# Patient Record
Sex: Female | Born: 1990 | Race: White | Hispanic: No | Marital: Single | Smoking: Current every day smoker
Health system: Southern US, Community
[De-identification: ages and names within clinical notes are randomized; demographics above are authoritative.]

## PROBLEM LIST (undated history)

## (undated) ENCOUNTER — Inpatient Hospital Stay (HOSPITAL_COMMUNITY): Payer: Self-pay

## (undated) DIAGNOSIS — B009 Herpesviral infection, unspecified: Secondary | ICD-10-CM

## (undated) DIAGNOSIS — B999 Unspecified infectious disease: Secondary | ICD-10-CM

## (undated) DIAGNOSIS — Z3490 Encounter for supervision of normal pregnancy, unspecified, unspecified trimester: Secondary | ICD-10-CM

## (undated) DIAGNOSIS — R569 Unspecified convulsions: Secondary | ICD-10-CM

## (undated) DIAGNOSIS — R87629 Unspecified abnormal cytological findings in specimens from vagina: Secondary | ICD-10-CM

## (undated) DIAGNOSIS — F32A Depression, unspecified: Secondary | ICD-10-CM

## (undated) DIAGNOSIS — F329 Major depressive disorder, single episode, unspecified: Secondary | ICD-10-CM

## (undated) HISTORY — PX: NO PAST SURGERIES: SHX2092

## (undated) HISTORY — DX: Herpesviral infection, unspecified: B00.9

## (undated) HISTORY — DX: Unspecified abnormal cytological findings in specimens from vagina: R87.629

---
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
## 2009-07-13 IMAGING — CR DG CHEST 2V
2 series · 2 of 2 positions shown · non-contrast
Comparison: None

CLINICAL DATA: Cough, fever, and sore throat

CHEST - 2 VIEW

[w chest pa]
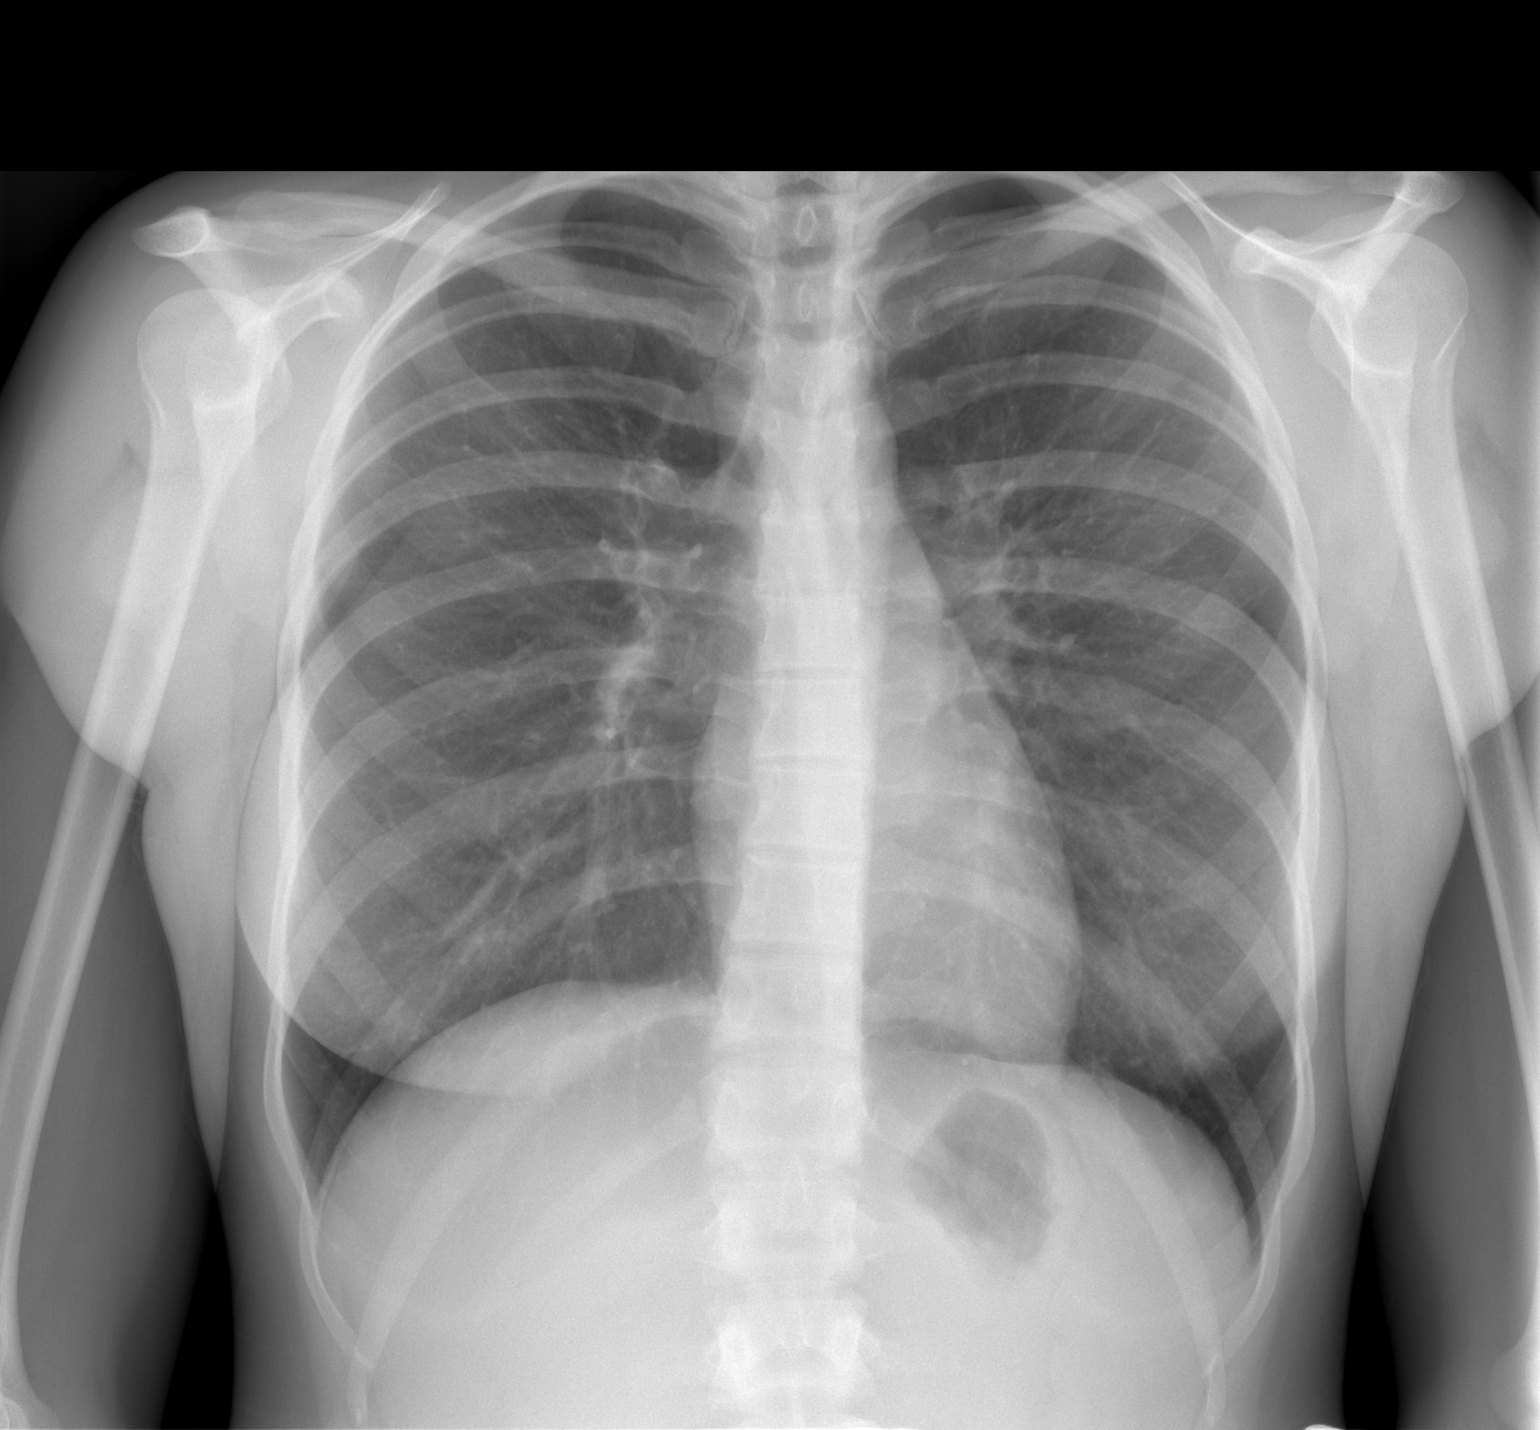

[w chest lat]
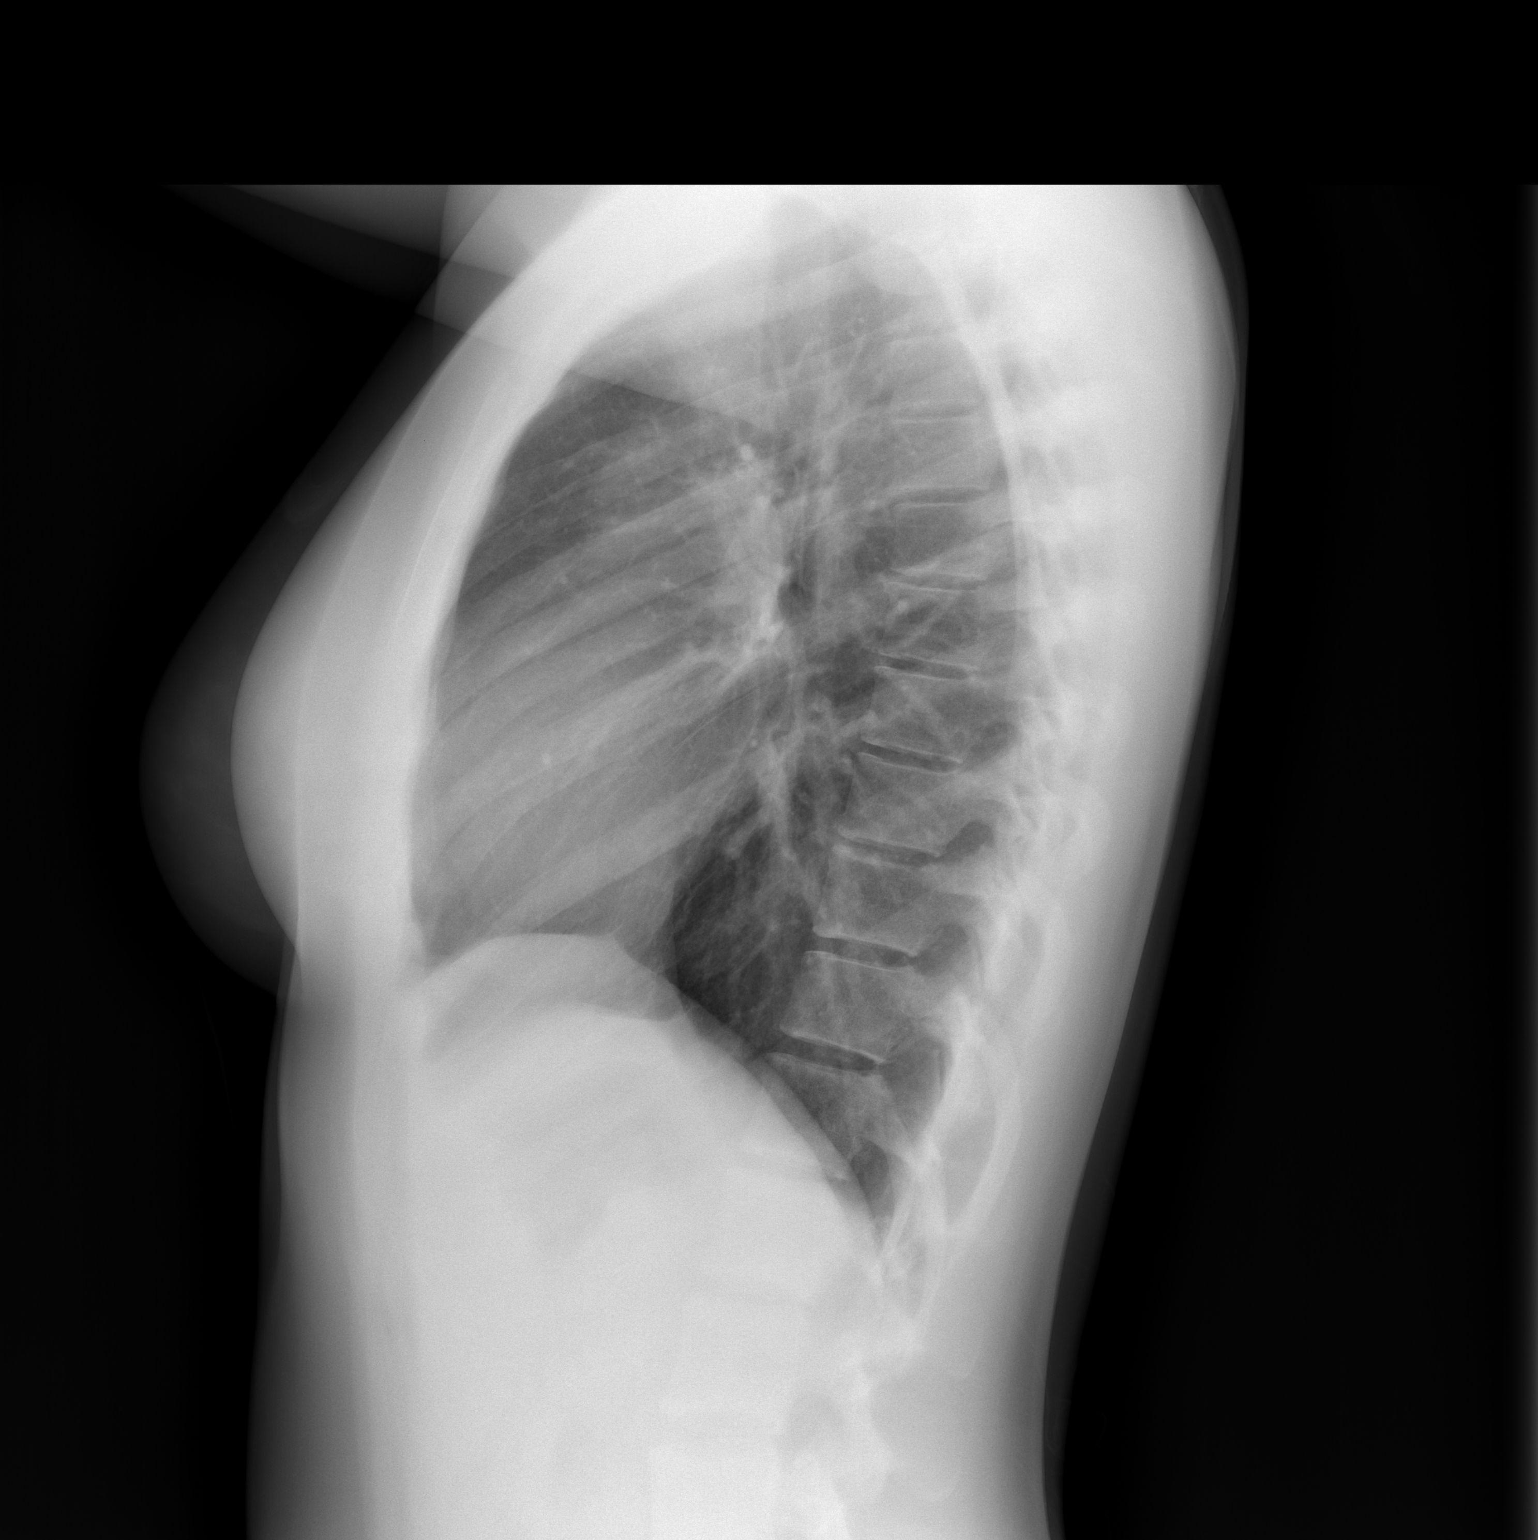

[2 of 2 positions shown; findings below may reference images not displayed]

FINDINGS: The heart size and vascularity are normal and the lungs are clear
except for some minimal peribronchial thickening consistent with
bronchitis.  No bony abnormality.
IMPRESSION: Mild bronchitic changes.

## 2010-11-05 ENCOUNTER — Emergency Department (HOSPITAL_COMMUNITY)
Admission: EM | Admit: 2010-11-05 | Discharge: 2010-11-05 | Disposition: A | Discharge status: Home / Self Care | Payer: Self-pay | Attending: Emergency Medicine | Admitting: Emergency Medicine

## 2010-11-05 DIAGNOSIS — A499 Bacterial infection, unspecified: Secondary | ICD-10-CM | POA: Insufficient documentation

## 2010-11-05 DIAGNOSIS — B9689 Other specified bacterial agents as the cause of diseases classified elsewhere: Secondary | ICD-10-CM | POA: Insufficient documentation

## 2010-11-05 DIAGNOSIS — N760 Acute vaginitis: Secondary | ICD-10-CM | POA: Insufficient documentation

## 2010-11-05 LAB — POCT PREGNANCY, URINE: Preg Test, Ur: NEGATIVE

## 2010-11-05 LAB — WET PREP, GENITAL
Trich, Wet Prep: NONE SEEN
Yeast Wet Prep HPF POC: NONE SEEN

## 2010-11-06 LAB — GC/CHLAMYDIA PROBE AMP, GENITAL
Chlamydia, DNA Probe: NEGATIVE
GC Probe Amp, Genital: NEGATIVE

## 2011-10-29 ENCOUNTER — Emergency Department (HOSPITAL_COMMUNITY)
Admission: EM | Admit: 2011-10-29 | Discharge: 2011-10-29 | Discharge status: Against Medical Advice | Payer: Self-pay | Attending: Emergency Medicine | Admitting: Emergency Medicine

## 2011-10-29 ENCOUNTER — Encounter (HOSPITAL_COMMUNITY): Payer: Self-pay | Admitting: *Deleted

## 2011-10-29 DIAGNOSIS — N898 Other specified noninflammatory disorders of vagina: Secondary | ICD-10-CM | POA: Insufficient documentation

## 2011-10-29 NOTE — ED Notes (Addendum)
Patient with history of std and thinks she has it again with vaginal discharge/odor

## 2011-10-29 NOTE — ED Notes (Signed)
Pt unable to wait any longer, will come back another day.

## 2012-01-28 ENCOUNTER — Encounter (HOSPITAL_COMMUNITY): Payer: Self-pay | Admitting: *Deleted

## 2012-01-28 ENCOUNTER — Emergency Department (HOSPITAL_COMMUNITY)
Admission: EM | Admit: 2012-01-28 | Discharge: 2012-01-30 | Disposition: A | Discharge status: Other Acute Inpatient Hospital | Payer: Self-pay | Attending: Emergency Medicine | Admitting: Emergency Medicine

## 2012-01-28 DIAGNOSIS — R45851 Suicidal ideations: Secondary | ICD-10-CM | POA: Insufficient documentation

## 2012-01-28 HISTORY — DX: Depression, unspecified: F32.A

## 2012-01-28 HISTORY — DX: Major depressive disorder, single episode, unspecified: F32.9

## 2012-01-28 LAB — COMPREHENSIVE METABOLIC PANEL
ALT: 23 U/L (ref 0–35)
Alkaline Phosphatase: 40 U/L (ref 39–117)
Chloride: 103 mEq/L (ref 96–112)
GFR calc Af Amer: >90 mL/min (ref >90)
Glucose, Bld: 102 mg/dL — ABNORMAL HIGH (ref 70–99)
Potassium: 3.5 mEq/L (ref 3.5–5.1)
Sodium: 135 mEq/L (ref 135–145)
Total Bilirubin: 0.3 mg/dL (ref 0.3–1.2)
Total Protein: 7.3 g/dL (ref 6.0–8.3)

## 2012-01-28 LAB — URINALYSIS, ROUTINE W REFLEX MICROSCOPIC
Ketones, ur: NEGATIVE mg/dL
Leukocytes, UA: NEGATIVE
Nitrite: NEGATIVE
Protein, ur: 30 mg/dL — AB
pH: 7 (ref 5.0–8.0)

## 2012-01-28 LAB — RAPID URINE DRUG SCREEN, HOSP PERFORMED
Amphetamines: NOT DETECTED
Barbiturates: NOT DETECTED
Benzodiazepines: NOT DETECTED

## 2012-01-28 LAB — CBC WITH DIFFERENTIAL/PLATELET
Eosinophils Absolute: 0.2 10*3/uL (ref 0.0–0.7)
Hemoglobin: 14.3 g/dL (ref 12.0–15.0)
Lymphocytes Relative: 20 % (ref 12–46)
Lymphs Abs: 1.3 10*3/uL (ref 0.7–4.0)
Neutro Abs: 4.3 10*3/uL (ref 1.7–7.7)
Neutrophils Relative %: 67 % (ref 43–77)
Platelets: 238 10*3/uL (ref 150–400)
RBC: 4.72 MIL/uL (ref 3.87–5.11)
WBC: 6.4 10*3/uL (ref 4.0–10.5)

## 2012-01-28 MED ORDER — ACYCLOVIR 800 MG PO TABS
ORAL_TABLET | ORAL | Status: AC
  Administered 2012-01-28: 400 mg
  Filled 2012-01-28: qty 1
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
## 2012-01-28 NOTE — ED Notes (Signed)
Patient wanted to know if she could make a phone call to her ex boyfriend and sheriff told patient no that that is one of the reasons she is up here in the first place. Patient is crying in her room and wanting to know how long till she is seen by a doctor. Patient was told the MD would be in as soon as possible.

## 2012-01-29 MED ORDER — NYSTATIN 100000 UNIT/GM EX CREA
TOPICAL_CREAM | CUTANEOUS | Status: AC
  Filled 2012-01-29: qty 15

## 2012-01-29 MED ORDER — ACYCLOVIR 800 MG PO TABS
400.0000 mg | ORAL_TABLET | Freq: Two times a day (BID) | ORAL | Status: DC
  Administered 2012-01-29 – 2012-01-30 (×3): 400 mg via ORAL
  Filled 2012-01-29: qty 1
  Filled 2012-01-29: qty 2
  Filled 2012-01-29: qty 1

## 2012-01-29 MED ORDER — NYSTATIN 100000 UNIT/GM EX CREA
TOPICAL_CREAM | Freq: Two times a day (BID) | CUTANEOUS | Status: DC
  Administered 2012-01-29: 22:00:00 via TOPICAL
  Filled 2012-01-29 (×2): qty 15

## 2012-01-29 MED ORDER — LORAZEPAM 1 MG PO TABS
ORAL_TABLET | ORAL | Status: AC
  Administered 2012-01-29: 1 mg
  Filled 2012-01-29: qty 1

## 2012-01-29 NOTE — [facility] Counselor (Signed)
[facility] has been accepted at [facility] by Alan Watt PA pending bed availability. 

## 2012-01-29 NOTE — ED Notes (Signed)
RN aware

## 2012-01-29 NOTE — ED Notes (Signed)
[facility]

## 2012-01-29 NOTE — ED Notes (Signed)
Patient is upset and wanting to call her mother but does not know her mother's number. Patient was told that I would try to look up her mother's number and let her call.

## 2012-01-29 NOTE — BH Assessment (Signed)
[facility] Assessment Progress Note      01/29/2012  There are no beds available at [facility] at this time. Information faxed and patient placed on waiting list.  There are no beds at [facility].  Spoke with Gabriel Cirri at [facility]. Currently there are no beds, however, there may be discharges tomorrow so information was faxed and patient is on waiting list for consideration of admission.  Patient has no insurance, so these are the referrals which can be made for the client.  Shon Baton, MSW, LCSW, LCASA, CSW-G
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
## 2012-01-30 MED ORDER — NYSTATIN 100000 UNIT/GM EX CREA
TOPICAL_CREAM | CUTANEOUS | Status: AC
  Filled 2012-01-30: qty 15

## 2012-01-30 NOTE — [facility] Counselor (Signed)
Ms Sandner has been accepted by Dr Wendall Stade to [facility] as an involuntary admission. New IVC paperwork has been completed. Support paperwork also completed. The patient is a [employer] resident and is a [employer]. Dr Deretha Emory informed of patient status.
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
## 2012-01-30 NOTE — ED Notes (Signed)
Report called to Tresa Endo, RN at [facility].

## 2012-01-30 NOTE — ED Notes (Signed)
Pt sleeping at this time, laying on back, resp even and nonlabored, NAD noted at this time. Sitter with patient at this time for safety.

## 2012-04-25 DIAGNOSIS — R569 Unspecified convulsions: Secondary | ICD-10-CM

## 2012-04-25 HISTORY — DX: Unspecified convulsions: R56.9

## 2013-12-04 DIAGNOSIS — F418 Other specified anxiety disorders: Secondary | ICD-10-CM | POA: Insufficient documentation

## 2015-05-21 LAB — HM PAP SMEAR: HM Pap smear: NORMAL

## 2016-05-30 DIAGNOSIS — Z113 Encounter for screening for infections with a predominantly sexual mode of transmission: Secondary | ICD-10-CM | POA: Diagnosis not present

## 2016-05-30 DIAGNOSIS — Z3009 Encounter for other general counseling and advice on contraception: Secondary | ICD-10-CM | POA: Diagnosis not present

## 2016-05-30 DIAGNOSIS — Z01419 Encounter for gynecological examination (general) (routine) without abnormal findings: Secondary | ICD-10-CM | POA: Diagnosis not present

## 2016-05-30 DIAGNOSIS — Z3041 Encounter for surveillance of contraceptive pills: Secondary | ICD-10-CM | POA: Diagnosis not present

## 2016-05-30 DIAGNOSIS — Z720 Tobacco use: Secondary | ICD-10-CM | POA: Diagnosis not present

## 2017-01-31 ENCOUNTER — Ambulatory Visit (INDEPENDENT_AMBULATORY_CARE_PROVIDER_SITE_OTHER): Payer: BLUE CROSS/BLUE SHIELD | Admitting: Physician Assistant

## 2017-01-31 ENCOUNTER — Other Ambulatory Visit (HOSPITAL_COMMUNITY)
Admission: RE | Admit: 2017-01-31 | Discharge: 2017-01-31 | Disposition: A | Discharge status: Home / Self Care | Payer: BLUE CROSS/BLUE SHIELD | Source: Ambulatory Visit | Attending: Physician Assistant | Admitting: Physician Assistant

## 2017-01-31 ENCOUNTER — Encounter: Payer: Self-pay | Admitting: Physician Assistant

## 2017-01-31 VITALS — BP 112/78 | HR 97 | Temp 98.7°F | Resp 14 | Ht 66.0 in | Wt 183.0 lb

## 2017-01-31 DIAGNOSIS — A601 Herpesviral infection of perianal skin and rectum: Secondary | ICD-10-CM

## 2017-01-31 DIAGNOSIS — Z113 Encounter for screening for infections with a predominantly sexual mode of transmission: Secondary | ICD-10-CM | POA: Diagnosis not present

## 2017-01-31 DIAGNOSIS — N898 Other specified noninflammatory disorders of vagina: Secondary | ICD-10-CM | POA: Diagnosis not present

## 2017-01-31 MED ORDER — METRONIDAZOLE 0.75 % EX GEL: CUTANEOUS | 0 refills | Status: DC

## 2017-01-31 MED ORDER — ACYCLOVIR 400 MG PO TABS: ORAL_TABLET | ORAL | 0 refills | Status: DC

## 2017-01-31 NOTE — Progress Notes (Signed)
Pre visit review using our clinic review tool, if applicable. No additional management support is needed unless otherwise documented below in the visit note. 

## 2017-01-31 NOTE — Patient Instructions (Signed)
Please go to the lab for blood work. I will call you with results of STD testing (blood and urine). Keep hydrated. Try to eat a well-balanced diet.   Take the Acyclovir as directed for current outbreak and then once daily for prevention. Start the metrogel and take as directed for BV.

## 2017-01-31 NOTE — Progress Notes (Signed)
   Patient presents to clinic today to establish care. 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 Endorses an outbreak about once every 2-3 months. Was previously on Acyclovir but has been out for some time. Would like to discuss treatment for current outbreak as well as preventative therapy. Patient also requests a routine STI panel. Has been sexually active with one female partner for 5 years but states they recently "took a break" and both had sexual encounters with new partners before getting back together. Endorses some increased vaginal discharge with a fishy odor. Has history of BV. Is currently on her menstrual period.   Past Medical History:  Diagnosis Date  . Depression   . HSV-2 infection     No current outpatient prescriptions on file prior to visit.   No current facility-administered medications on file prior to visit.     No Known Allergies  Family History  Problem Relation Age of Onset  . Other Mother        Bone deteriation  . Alcohol abuse Mother   . Cirrhosis Father   . Alcohol abuse Father   . Diabetes Brother   . Alcohol abuse Maternal Grandmother   . Alcohol abuse Maternal Grandfather   . Alcohol abuse Paternal Grandmother   . Alcohol abuse Paternal Grandfather     Social History   Social History  . Marital status: Single    Spouse name: N/A  . Number of children: N/A  . Years of education: N/A   Social History Main Topics  . Smoking status: Current Every Day Smoker    Packs/day: 0.50    Types: Cigarettes  . Smokeless tobacco: Never Used  . Alcohol use Yes     Comment: 5 shots of liquor per day  . Drug use: Yes    Types: Marijuana  . Sexual activity: Yes    Birth control/ protection: Condom   Other Topics Concern  . None   Social History Narrative  . None   Review of Systems - See HPI.  All other ROS are negative.  BP 112/78   Pulse 97   Temp 98.7 F (37.1 C) (Oral)   Resp 14   Ht   (1.676 m)   Wt 183 lb (83 kg)   SpO2 98%   BMI 29.54 kg/m   Physical Exam  Constitutional: She is well-developed, well-nourished, and in no distress.  HENT:  Head: Normocephalic and atraumatic.  Eyes: Conjunctivae are normal.  Neck: Neck supple.  Cardiovascular: Normal rate, regular rhythm, normal heart sounds and intact distal pulses.   Pulmonary/Chest: Effort normal and breath sounds normal. No respiratory distress. She has no wheezes. She has no rales. She exhibits no tenderness.  Genitourinary:  Genitourinary Comments: Patient defers  Lymphadenopathy:    She has no cervical adenopathy.  Skin: Skin is warm and dry. No rash noted.  Vitals reviewed.  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 Start Acyclovir 400 mg TID x 5 days. Then start once daily for prevention. Discussed healthy diet and exercise for immune support. Discussed smoking and effects on outbreaks.  Vaginal discharge Patient on menstrual cycle. Declines pelvic exam. Urine ancillary testing performed in addition to STI panel. Will start metrogel for BV giving history.  Screening examination for STD (sexually transmitted disease) Routine STI screen labs today. Patient with high risk sexual behavior. Discussed safe sex practices.     Piedad Climes, PA-C

## 2017-02-01 DIAGNOSIS — Z0000 Encounter for general adult medical examination without abnormal findings: Secondary | ICD-10-CM | POA: Insufficient documentation

## 2017-02-01 DIAGNOSIS — N898 Other specified noninflammatory disorders of vagina: Secondary | ICD-10-CM | POA: Insufficient documentation

## 2017-02-01 LAB — HIV ANTIBODY (ROUTINE TESTING W REFLEX): HIV: NONREACTIVE

## 2017-02-01 LAB — RPR: RPR Ser Ql: NONREACTIVE

## 2017-02-01 NOTE — Assessment & Plan Note (Signed)
Current outbreak. Start Acyclovir 400 mg TID x 5 days. Then start once daily for prevention. Discussed healthy diet and exercise for immune support. Discussed smoking and effects on outbreaks.

## 2017-02-01 NOTE — Assessment & Plan Note (Signed)
Patient on menstrual cycle. Declines pelvic exam. Urine ancillary testing performed in addition to STI panel. Will start metrogel for BV giving history.

## 2017-02-01 NOTE — Assessment & Plan Note (Signed)
Routine STI screen labs today. Patient with high risk sexual behavior. Discussed safe sex practices.

## 2017-02-02 LAB — URINE CYTOLOGY ANCILLARY ONLY
Chlamydia: NEGATIVE
NEISSERIA GONORRHEA: NEGATIVE
TRICH (WINDOWPATH): NEGATIVE

## 2017-02-06 LAB — URINE CYTOLOGY ANCILLARY ONLY: Candida vaginitis: NEGATIVE

## 2017-02-20 ENCOUNTER — Ambulatory Visit: Payer: BLUE CROSS/BLUE SHIELD | Admitting: Physician Assistant

## 2017-02-22 ENCOUNTER — Ambulatory Visit (INDEPENDENT_AMBULATORY_CARE_PROVIDER_SITE_OTHER): Payer: BLUE CROSS/BLUE SHIELD | Admitting: Physician Assistant

## 2017-02-22 ENCOUNTER — Encounter: Payer: Self-pay | Admitting: Physician Assistant

## 2017-02-22 ENCOUNTER — Other Ambulatory Visit (HOSPITAL_COMMUNITY)
Admission: RE | Admit: 2017-02-22 | Discharge: 2017-02-22 | Disposition: A | Discharge status: Home / Self Care | Payer: BLUE CROSS/BLUE SHIELD | Source: Ambulatory Visit | Attending: Physician Assistant | Admitting: Physician Assistant

## 2017-02-22 VITALS — BP 122/80 | HR 82 | Temp 98.6°F | Resp 14 | Ht 66.0 in | Wt 181.0 lb

## 2017-02-22 DIAGNOSIS — N939 Abnormal uterine and vaginal bleeding, unspecified: Secondary | ICD-10-CM | POA: Diagnosis not present

## 2017-02-22 DIAGNOSIS — N898 Other specified noninflammatory disorders of vagina: Secondary | ICD-10-CM | POA: Insufficient documentation

## 2017-02-22 DIAGNOSIS — F17210 Nicotine dependence, cigarettes, uncomplicated: Secondary | ICD-10-CM | POA: Insufficient documentation

## 2017-02-22 DIAGNOSIS — Z720 Tobacco use: Secondary | ICD-10-CM | POA: Diagnosis not present

## 2017-02-22 MED ORDER — VARENICLINE TARTRATE 0.5 MG X 11 & 1 MG X 42 PO MISC: ORAL | 0 refills | Status: DC

## 2017-02-22 MED ORDER — ALBUTEROL SULFATE HFA 108 (90 BASE) MCG/ACT IN AERS: 2.0000 | INHALATION_SPRAY | Freq: Four times a day (QID) | RESPIRATORY_TRACT | 0 refills | Status: DC | PRN

## 2017-02-22 NOTE — Progress Notes (Signed)
Patient presents to clinic today c/o vaginal odor after treatment for BV.   Past Medical History:  Diagnosis Date  . Depression   . HSV-2 infection     Current Outpatient Prescriptions on File Prior to Visit  Medication Sig Dispense Refill  . acyclovir (ZOVIRAX) 400 MG tablet Take 1 tablet by mouth TID x 5 days. Then 1 tablet daily. 30 tablet 0   No current facility-administered medications on file prior to visit.     No Known Allergies  Family History  Problem Relation Age of Onset  . Other Mother        Bone deteriation  . Alcohol abuse Mother   . Cirrhosis Father   . Alcohol abuse Father   . Diabetes Brother   . Alcohol abuse Maternal Grandmother   . Alcohol abuse Maternal Grandfather   . Alcohol abuse Paternal Grandmother   . Alcohol abuse Paternal Grandfather     Social History   Social History  . Marital status: Single    Spouse name: N/A  . Number of children: N/A  . Years of education: N/A   Social History Main Topics  . Smoking status: Current Every Day Smoker    Packs/day: 0.50    Types: Cigarettes  . Smokeless tobacco: Never Used  . Alcohol use Yes     Comment: 5 shots of liquor per day  . Drug use: Yes    Types: Marijuana  . Sexual activity: Yes    Birth control/ protection: Condom   Other Topics Concern  . None   Social History Narrative  . None   Review of Systems - See HPI.  All other ROS are negative.  BP 122/80   Pulse 82   Temp 98.6 F (37 C) (Oral)   Resp 14   Ht 5\' 6"  (1.676 m)   Wt 181 lb (82.1 kg)   SpO2 99%   BMI 29.21 kg/m   Physical Exam  Constitutional: She is oriented to person, place, and time and well-developed, well-nourished, and in no distress.  HENT:  Head: Normocephalic and atraumatic.  Eyes: Conjunctivae are normal.  Neck: Neck supple.  Cardiovascular: Normal rate, regular rhythm, normal heart sounds and intact distal pulses.   Pulmonary/Chest: Effort normal and breath sounds normal. No respiratory  distress. She has no wheezes. She has no rales. She exhibits no tenderness.  Abdominal: Soft. Bowel sounds are normal. There is no tenderness.  Genitourinary: Uterus normal, cervix normal, right adnexa normal, left adnexa normal and vulva normal. Bloody and vaginal discharge found.  Neurological: She is alert and oriented to person, place, and time.  Skin: Skin is warm and dry. No rash noted.  Psychiatric: Affect normal.  Vitals reviewed.  Recent Results (from the past 2160 hour(s))  Urine cytology ancillary only     Status: None   Collection Time: 01/31/17 12:00 AM  Result Value Ref Range   Chlamydia Negative     Comment: Normal Reference Range - Negative   Neisseria gonorrhea Negative     Comment: Normal Reference Range - Negative   Trichomonas Negative     Comment: Normal Reference Range - Negative  Urine cytology ancillary only     Status: Abnormal   Collection Time: 01/31/17 12:00 AM  Result Value Ref Range   Bacterial vaginitis (A)     **POSITIVE for Gardnerella vaginalis POSITIVE for Atopobium vaginae POSITIVE for Megasphaera 1 POSITIVE for BVAB2**    Comment: Normal Reference Range - Negative   Candida vaginitis  Negative for Candida Vaginitis Microorganisms     Comment: Normal Reference Range - Negative  RPR     Status: None   Collection Time: 01/31/17  4:51 PM  Result Value Ref Range   RPR Ser Ql NON-REACTIVE NON-REACTI  HIV antibody     Status: None   Collection Time: 01/31/17  4:51 PM  Result Value Ref Range   HIV 1&2 Ab, 4th Generation NON-REACTIVE NON-REACTI    Comment: HIV-1 antigen and HIV-1/HIV-2 antibodies were not detected. There is no laboratory evidence of HIV infection. Marland Kitchen PLEASE NOTE: This information has been disclosed to you from records whose confidentiality may be protected by state law.  If your state requires such protection, then the state law prohibits you from making any further disclosure of the information without the specific written consent  of the person to whom it pertains, or as otherwise permitted by law. A general authorization for the release of medical or other information is NOT sufficient for this purpose. . For additional information please refer to http://education.questdiagnostics.com/faq/FAQ106 (This link is being provided for informational/ educational purposes only.) . Marland Kitchen The performance of this assay has not been clinically validated in patients less than 12 years old. .    Assessment/Plan: 1. Vaginal odor Wet prep obtained. Patient starting menstrual period. Discussed other possible causes including change in hygiene products. Discussed proper care of sensitive areas. Will treat further based on results. - Cervicovaginal ancillary only  2. Uterine bleeding 1.5 days Patient notes it is early for her start period. Has only been about 3.5. Weeks. No labial bleeding or lesion noted. No injury or irritation of vaginal walls or friable cervix noted. Negative CMT. Bleeding from uterus noted. No adnexal/uterine tenderness or swelling. No pain on exam. Reassurance given likely early start of period. Strict return precautions discussed.   3. Tobacco abuse Start Chantix.    Piedad Climes, PA-C

## 2017-02-22 NOTE — Patient Instructions (Signed)
Please keep well hydrated. It seems you are likely starting your menstrual period slightly early this month. Keep an eye on symptoms. If there is anything different from menstrual periods, please call me.  Exam was benign except for slight uterine bleeding. Not coming from the external vagina or internal vaginal walls. No sign of irritated or tender cervix. If there eis any new onset of pain or discomfort, increased bleeding or other new symptoms, please call me as we would want to order imaging. Any severe symptoms, please go to the ER.   For odor, we are checking labs to make sure BV has not recurred.  Make sure to use a gentle, non-scented, non-dyed soap (and a small amount) when cleaning personal areas. The scent and color comes from chemicals which can be irritative to the vagina.

## 2017-02-22 NOTE — Progress Notes (Signed)
Pre visit review using our clinic review tool, if applicable. No additional management support is needed unless otherwise documented below in the visit note. 

## 2017-02-23 LAB — CERVICOVAGINAL ANCILLARY ONLY: Wet Prep (BD Affirm): POSITIVE — AB

## 2017-02-24 ENCOUNTER — Telehealth: Payer: Self-pay | Admitting: Physician Assistant

## 2017-02-24 MED ORDER — CLINDAMYCIN HCL 300 MG PO CAPS: 300.0000 mg | ORAL_CAPSULE | Freq: Two times a day (BID) | ORAL | 0 refills | Status: DC

## 2017-02-24 NOTE — Telephone Encounter (Signed)
Called number in chart. A lady states patient doesn't live there anymore.

## 2017-02-24 NOTE — Telephone Encounter (Signed)
Testing positive for BV. Sent in Rx for Cleocin for her to take as directed to resolve infection. If there are any lingering symptoms after treatment, please let me know.

## 2017-02-27 NOTE — Telephone Encounter (Signed)
Advised patient her wet prep still showed BV present. Previous medication did not clear everything up. Clindamycin sent to the pharmacy for the BV. She is agreeable with taking the abx.

## 2017-03-06 ENCOUNTER — Other Ambulatory Visit: Payer: Self-pay | Admitting: Physician Assistant

## 2017-03-06 NOTE — Telephone Encounter (Signed)
Pt called asking for refill on Acyclovir. States that she is out and course is not complete. Requests refill for pick up today.

## 2017-03-07 NOTE — Telephone Encounter (Signed)
Patient should be taking medication 1 tablet daily. Rx corrected and sent over refills

## 2017-03-09 ENCOUNTER — Encounter: Payer: Self-pay | Admitting: Emergency Medicine

## 2017-04-25 NOTE — L&D Delivery Note (Signed)
CRITICAL VALUE ALERT  Critical Value: positive GBS  Date & Time Notied:  1300 02/23/2018  Provider Notified: yes  Orders Received/Actions taken: none

## 2017-06-19 ENCOUNTER — Encounter: Payer: Self-pay | Admitting: Emergency Medicine

## 2017-07-17 ENCOUNTER — Ambulatory Visit (INDEPENDENT_AMBULATORY_CARE_PROVIDER_SITE_OTHER): Payer: BLUE CROSS/BLUE SHIELD | Admitting: Physician Assistant

## 2017-07-17 ENCOUNTER — Encounter: Payer: Self-pay | Admitting: Physician Assistant

## 2017-07-17 ENCOUNTER — Other Ambulatory Visit: Payer: Self-pay

## 2017-07-17 VITALS — BP 104/72 | HR 71 | Temp 98.2°F | Resp 16 | Ht 65.0 in | Wt 175.0 lb

## 2017-07-17 DIAGNOSIS — Z3491 Encounter for supervision of normal pregnancy, unspecified, first trimester: Secondary | ICD-10-CM

## 2017-07-17 DIAGNOSIS — Z87898 Personal history of other specified conditions: Secondary | ICD-10-CM | POA: Diagnosis not present

## 2017-07-17 DIAGNOSIS — Z0000 Encounter for general adult medical examination without abnormal findings: Secondary | ICD-10-CM | POA: Diagnosis not present

## 2017-07-17 DIAGNOSIS — F1991 Other psychoactive substance use, unspecified, in remission: Secondary | ICD-10-CM | POA: Insufficient documentation

## 2017-07-17 LAB — COMPREHENSIVE METABOLIC PANEL
ALBUMIN: 4.5 g/dL (ref 3.5–5.2)
ALT: 104 U/L — ABNORMAL HIGH (ref 0–35)
AST: 52 U/L — AB (ref 0–37)
Alkaline Phosphatase: 48 U/L (ref 39–117)
BILIRUBIN TOTAL: 0.5 mg/dL (ref 0.2–1.2)
BUN: 8 mg/dL (ref 6–23)
CHLORIDE: 101 meq/L (ref 96–112)
CO2: 28 mEq/L (ref 19–32)
CREATININE: 0.62 mg/dL (ref 0.40–1.20)
Calcium: 10.1 mg/dL (ref 8.4–10.5)
GFR: 122.22 mL/min (ref >60.00)
Glucose, Bld: 103 mg/dL — ABNORMAL HIGH (ref 70–99)
Potassium: 4 mEq/L (ref 3.5–5.1)
Sodium: 135 mEq/L (ref 135–145)
Total Protein: 7.4 g/dL (ref 6.0–8.3)

## 2017-07-17 LAB — URINALYSIS, ROUTINE W REFLEX MICROSCOPIC
Bilirubin Urine: NEGATIVE
Hgb urine dipstick: NEGATIVE
Ketones, ur: NEGATIVE
Leukocytes, UA: NEGATIVE
Nitrite: NEGATIVE
PH: 6 (ref 5.0–8.0)
RBC / HPF: NONE SEEN (ref 0–?)
Specific Gravity, Urine: 1.01 (ref 1.000–1.030)
TOTAL PROTEIN, URINE-UPE24: NEGATIVE
Urine Glucose: NEGATIVE
Urobilinogen, UA: 0.2 (ref 0.0–1.0)

## 2017-07-17 LAB — CBC WITH DIFFERENTIAL/PLATELET
BASOS PCT: 0.4 % (ref 0.0–3.0)
Basophils Absolute: 0 10*3/uL (ref 0.0–0.1)
EOS ABS: 0.3 10*3/uL (ref 0.0–0.7)
EOS PCT: 3.7 % (ref 0.0–5.0)
HEMATOCRIT: 42 % (ref 36.0–46.0)
HEMOGLOBIN: 14.4 g/dL (ref 12.0–15.0)
LYMPHS PCT: 16.9 % (ref 12.0–46.0)
Lymphs Abs: 1.5 10*3/uL (ref 0.7–4.0)
MCHC: 34.3 g/dL (ref 30.0–36.0)
MCV: 93.6 fl (ref 78.0–100.0)
MONOS PCT: 6.8 % (ref 3.0–12.0)
Monocytes Absolute: 0.6 10*3/uL (ref 0.1–1.0)
NEUTROS ABS: 6.4 10*3/uL (ref 1.4–7.7)
Neutrophils Relative %: 72.2 % (ref 43.0–77.0)
Platelets: 236 10*3/uL (ref 150.0–400.0)
RBC: 4.49 Mil/uL (ref 3.87–5.11)
RDW: 13.3 % (ref 11.5–15.5)
WBC: 8.9 10*3/uL (ref 4.0–10.5)

## 2017-07-17 LAB — LIPID PANEL
CHOLESTEROL: 159 mg/dL (ref 0–200)
HDL: 65 mg/dL (ref >39.00)
LDL Cholesterol: 85 mg/dL (ref 0–99)
NONHDL: 93.61
Total CHOL/HDL Ratio: 2
Triglycerides: 42 mg/dL (ref 0.0–149.0)
VLDL: 8.4 mg/dL (ref 0.0–40.0)

## 2017-07-17 LAB — HEMOGLOBIN A1C: Hgb A1c MFr Bld: 5.3 % (ref 4.6–6.5)

## 2017-07-17 LAB — TSH: TSH: 2.14 u[IU]/mL (ref 0.35–4.50)

## 2017-07-17 LAB — HCG, QUANTITATIVE, PREGNANCY: Quantitative HCG: 14655 m[IU]/mL

## 2017-07-17 NOTE — Assessment & Plan Note (Signed)
Depression screen negative. Health Maintenance reviewed -- PAP up-to-date. Preventive schedule discussed and handout given in AVS. Will obtain fasting labs today.

## 2017-07-17 NOTE — Patient Instructions (Signed)
Please go to the lab for blood work.   Our office will call you with your results unless you have chosen to receive results via MyChart.  If your blood work is normal we will follow-up each year for physicals and as scheduled for chronic medical problems.  If anything is abnormal we will treat accordingly and get you in for a follow-up.  Please refrain from any alcohol or illicit drug use.  Start cutting back on smoking.   I am setting you with an Obstetrician for further management during pregnancy.  Start a prenatal vitamin with folic acid.    Folic Acid and Pregnancy What is folic acid? Folic acid is a B vitamin. Your body needs it to make new cells. Folic acid is also called folate. Folate is the form of the B vitamin that is found naturally in food. Folic acid is the artificial (synthetic) form of the B vitamin. Folic acid is added to certain foods (fortified foods) and is also available in dietary supplements such as prenatal vitamins. All women who may become pregnant or are planning to become pregnant need at least 469-629 mcg of folic acid daily. Most pregnant women need 528-413 mcg of folic acid per day, but some women need more. What are the benefits of taking folic acid during pregnancy? Taking folic acid during pregnancy helps to prevent abnormalities that can develop in an unborn baby's brain, spine, or spinal column (neural tube defects). These defects include:  Spina bifida. This is when the spinal column does not close completely during development, leaving the spinal cord exposed. This means the nerves that control leg movements and other bodily functions do not work. Spina bifida causes lifelong disabilities.  Anencephaly. Babies born with anencephaly have an underdeveloped brain. They may have little or no brain matter, and they could also be missing parts of the skull.  Neural tube defects occur in the first few months (first trimester) of pregnancy. If you are trying  to get pregnant, make sure you get enough folic acid for at least one month before you start trying. It is important to get enough folic acid even if you are not trying to get pregnant, because some pregnancies are unplanned.If your pregnancy is unplanned, start taking folic acid as soon as you find out that you are pregnant. Folic acid can also help to prevent a drop in red blood cells that carry oxygen throughout the body (anemia). Anemia during pregnancy is associated with complications such as low birth weight and premature birth. What are the side effects of taking folic acid? Folic acid supplements may cause side effects, such as:  Diarrhea.  Abdominal cramping.  Folic acid can also interact with certain medicines that are used to treat other conditions. These medicines include:  Methotrexate. This is an anticancer drug that is also used to treat some autoimmune diseases.  Antiepileptic medicine, such as phenytoin, carbamazepine, and valproate.  Sulfasalazine. This medicine is used to treat ulcerative colitis.  How should I take folic acid during pregnancy? Even women who have a healthy, well-balanced diet may not get enough folate from food. Synthetic folic acid is easier for your body to use than the folate that is found naturally in certain foods. In addition to eating folate-rich foods, you can ensure that you get enough folic acid by taking prenatal vitamins and eating foods that are fortified with folic acid. Make sure your prenatal vitamin or B vitamin supplement contains 400-800 micrograms of folic acid. What foods should  I eat? Folate is found naturally in:  Dark green leafy vegetables, such as spinach.  Asparagus.  Brussels sprouts.  Citrus fruits and juices.  Nuts.  Beans.  Peas.  Eggs.  Meat, poultry, and seafood.  Soy products.  Whole grains.  Folic acid is often added to certain foods, including:  Bread.  Pasta.  White rice.  Breakfast  cereal.  Flour.  Cornmeal.  When should I seek medical care? Some women may need to take more than the recommended amount of folic acid. Talk with your health care provider about your folic acid needs if:  You had a baby with a neural tube defect and you want to get pregnant again.  You have a family history of spina bifida.  You have spina bifida and you want to get pregnant.  Talk with your doctor about folic acid supplements if you are taking medicine for any of the following conditions:  Epilepsy.  Type 2 diabetes.  Autoimmune diseases, including rheumatoid arthritis, lupus, psoriasis, celiac disease, and inflammatory bowel disease.  Asthma.  Kidney disease.  Liver disease.  Sickle cell disease.  This information is not intended to replace advice given to you by your health care provider. Make sure you discuss any questions you have with your health care provider. Document Released: 04/14/2003 Document Revised: 03/07/2016 Document Reviewed: 12/24/2014 Elsevier Interactive Patient Education  2017 [place] 18-39 Years, Female Preventive care refers to lifestyle choices and visits with your health care provider that can promote health and wellness. What does preventive care include?  A yearly physical exam. This is also called an annual well check.  Dental exams once or twice a year.  Routine eye exams. Ask your health care provider how often you should have your eyes checked.  Personal lifestyle choices, including: ? Daily care of your teeth and gums. ? Regular physical activity. ? Eating a healthy diet. ? Avoiding tobacco and drug use. ? Limiting alcohol use. ? Practicing safe sex. ? Taking vitamin and mineral supplements as recommended by your health care provider. What happens during an annual well check? The services and screenings done by your health care provider during your annual well check will depend on your age, overall  health, lifestyle risk factors, and family history of disease. Counseling Your health care provider may ask you questions about your:  Alcohol use.  Tobacco use.  Drug use.  Emotional well-being.  Home and relationship well-being.  Sexual activity.  Eating habits.  Work and work Statistician.  Method of birth control.  Menstrual cycle.  Pregnancy history.  Screening You may have the following tests or measurements:  Height, weight, and BMI.  Diabetes screening. This is done by checking your blood sugar (glucose) after you have not eaten for a while (fasting).  Blood pressure.  Lipid and cholesterol levels. These may be checked every 5 years starting at age 58.  Skin check.  Hepatitis C blood test.  Hepatitis B blood test.  Sexually transmitted disease (STD) testing.  BRCA-related cancer screening. This may be done if you have a family history of breast, ovarian, tubal, or peritoneal cancers.  Pelvic exam and Pap test. This may be done every 3 years starting at age 42. Starting at age 56, this may be done every 5 years if you have a Pap test in combination with an HPV test.  Discuss your test results, treatment options, and if necessary, the need for more tests with your health care provider.  Vaccines Your health care provider may recommend certain vaccines, such as:  Influenza vaccine. This is recommended every year.  Tetanus, diphtheria, and acellular pertussis (Tdap, Td) vaccine. You may need a Td booster every 10 years.  Varicella vaccine. You may need this if you have not been vaccinated.  HPV vaccine. If you are 50 or younger, you may need three doses over 6 months.  Measles, mumps, and rubella (MMR) vaccine. You may need at least one dose of MMR. You may also need a second dose.  Pneumococcal 13-valent conjugate (PCV13) vaccine. You may need this if you have certain conditions and were not previously vaccinated.  Pneumococcal polysaccharide  (PPSV23) vaccine. You may need one or two doses if you smoke cigarettes or if you have certain conditions.  Meningococcal vaccine. One dose is recommended if you are age 67-21 years and a first-year college student living in a residence hall, or if you have one of several medical conditions. You may also need additional booster doses.  Hepatitis A vaccine. You may need this if you have certain conditions or if you travel or work in places where you may be exposed to hepatitis A.  Hepatitis B vaccine. You may need this if you have certain conditions or if you travel or work in places where you may be exposed to hepatitis B.  Haemophilus influenzae type b (Hib) vaccine. You may need this if you have certain risk factors.  Talk to your health care provider about which screenings and vaccines you need and how often you need them. This information is not intended to replace advice given to you by your health care provider. Make sure you discuss any questions you have with your health care provider. Document Released: 06/07/2001 Document Revised: 12/30/2015 Document Reviewed: 02/10/2015 Elsevier Interactive Patient Education  [employer].

## 2017-07-17 NOTE — Assessment & Plan Note (Signed)
Potentially 3+ weeks. Serum HCG quantitative today. Referral to OB placed. Medication list given. Discussed with her that she needs to avoid all alcohol consumption. No recreational drug use. Start working on smoking cessation.

## 2017-07-17 NOTE — Progress Notes (Signed)
Patient presents to clinic today for annual exam.  Patient is fasting for labs.   Endorses last period 6 weeks ago. Was late for her period this month (due on the 13th), took a pregnancy test that was positive. Has no follow-up with OB or GYN.  Patient is working hard on diet currently. Continue   Cocaine and Heroin for the 1st time 3 weeks ago, Prior to knowledge or pregnancy.  Notes some runny nose with allergy.  Some urinary frequency. No other urinary symptoms.   Health Maintenance: PAP -- Normal PAP 2017 per patient.    Past Medical History:  Diagnosis Date  . Depression   . HSV-2 infection     Past Surgical History:  Procedure Laterality Date  . NO PAST SURGERIES      No current outpatient medications on file prior to visit.   No current facility-administered medications on file prior to visit.     No Known Allergies  Family History  Problem Relation Age of Onset  . Other Mother        Bone deteriation  . Alcohol abuse Mother   . Cirrhosis Father   . Alcohol abuse Father   . Diabetes Brother   . Alcohol abuse Maternal Grandmother   . Alcohol abuse Maternal Grandfather   . Alcohol abuse Paternal Grandmother   . Alcohol abuse Paternal Grandfather     Social History   Socioeconomic History  . Marital status: Single    Spouse name: Not on file  . Number of children: Not on file  . Years of education: Not on file  . Highest education level: Not on file  Occupational History  . Not on file  Social Needs  . Financial resource strain: Not on file  . Food insecurity:    Worry: Not on file    Inability: Not on file  . Transportation needs:    Medical: Not on file    Non-medical: Not on file  Tobacco Use  . Smoking status: Current Every Day Smoker    Packs/day: 0.50    Types: Cigarettes  . Smokeless tobacco: Never Used  Substance and Sexual Activity  . Alcohol use: Yes    Comment: 5 shots of liquor per day  . Drug use: Yes    Types: Marijuana    . Sexual activity: Yes    Birth control/protection: Condom  Lifestyle  . Physical activity:    Days per week: Not on file    Minutes per session: Not on file  . Stress: Not on file  Relationships  . Social connections:    Talks on phone: Not on file    Gets together: Not on file    Attends religious service: Not on file    Active member of club or organization: Not on file    Attends meetings of clubs or organizations: Not on file    Relationship status: Not on file  . Intimate partner violence:    Fear of current or ex partner: Not on file    Emotionally abused: Not on file    Physically abused: Not on file    Forced sexual activity: Not on file  Other Topics Concern  . Not on file  Social History Narrative  . Not on file   Review of Systems  Constitutional: Negative for fever and weight loss.  HENT: Negative for ear discharge, ear pain, hearing loss and tinnitus.   Eyes: Negative for blurred vision, double vision, photophobia and pain.  Respiratory: Negative for cough and shortness of breath.   Cardiovascular: Negative for chest pain and palpitations.  Gastrointestinal: Negative for abdominal pain, blood in stool, constipation, diarrhea, heartburn, melena, nausea and vomiting.  Genitourinary: Negative for dysuria, flank pain, frequency, hematuria and urgency.  Musculoskeletal: Negative for falls.  Neurological: Negative for dizziness, loss of consciousness and headaches.  Endo/Heme/Allergies: Negative for environmental allergies.  Psychiatric/Behavioral: Negative for depression, hallucinations, substance abuse and suicidal ideas. The patient is not nervous/anxious and does not have insomnia.     BP 104/72   Pulse 71   Temp 98.2 F (36.8 C) (Oral)   Resp 16   Ht 5\' 5"  (1.651 m)   Wt 175 lb (79.4 kg)   LMP 06/07/2017   SpO2 99%   BMI 29.12 kg/m   Physical Exam  Constitutional: She is oriented to person, place, and time and well-developed, well-nourished, and in  no distress.  HENT:  Head: Normocephalic and atraumatic.  Right Ear: Tympanic membrane, external ear and ear canal normal.  Left Ear: Tympanic membrane, external ear and ear canal normal.  Nose: Nose normal. No mucosal edema.  Mouth/Throat: Uvula is midline, oropharynx is clear and moist and mucous membranes are normal. No oropharyngeal exudate or posterior oropharyngeal erythema.  Eyes: Pupils are equal, round, and reactive to light. Conjunctivae are normal.  Neck: Neck supple. No thyromegaly present.  Cardiovascular: Normal rate, regular rhythm, normal heart sounds and intact distal pulses.  Pulmonary/Chest: Effort normal and breath sounds normal. No respiratory distress. She has no wheezes. She has no rales.  Abdominal: Soft. Bowel sounds are normal. She exhibits no distension and no mass. There is no tenderness. There is no rebound and no guarding.  Lymphadenopathy:    She has no cervical adenopathy.  Neurological: She is alert and oriented to person, place, and time. No cranial nerve deficit.  Skin: Skin is warm and dry. No rash noted.  Psychiatric: Affect normal.  Vitals reviewed.  Assessment/Plan: Visit for preventive health examination Depression screen negative. Health Maintenance reviewed -- PAP up-to-date. Preventive schedule discussed and handout given in AVS. Will obtain fasting labs today.   Pregnant and not yet delivered in first trimester Potentially 3+ weeks. Serum HCG quantitative today. Referral to OB placed. Medication list given. Discussed with her that she needs to avoid all alcohol consumption. No recreational drug use. Start working on smoking cessation.   History of illicit drug use HIV screen and Hepatic Panel today.     Piedad ClimesWilliam Cody Jerah Esty, PA-C

## 2017-07-17 NOTE — Assessment & Plan Note (Signed)
HIV screen and Hepatic Panel today.

## 2017-07-18 ENCOUNTER — Other Ambulatory Visit: Payer: Self-pay | Admitting: Physician Assistant

## 2017-07-18 DIAGNOSIS — Z3A01 Less than 8 weeks gestation of pregnancy: Secondary | ICD-10-CM

## 2017-07-19 LAB — REFLEX TIQ

## 2017-07-19 LAB — HIV ANTIBODY (ROUTINE TESTING W REFLEX): HIV: NONREACTIVE

## 2017-07-19 LAB — ACUTE HEP PANEL AND HEP B SURFACE AB
HEP B S AG: NONREACTIVE
HEPATITIS C ANTIBODY REFILL: NONREACTIVE
Hep A IgM: NONREACTIVE
Hep B C IgM: NONREACTIVE
SIGNAL TO CUT-OFF: 0.01 (ref <1.00)

## 2017-07-21 ENCOUNTER — Telehealth: Payer: Self-pay | Admitting: *Deleted

## 2017-07-21 NOTE — Telephone Encounter (Signed)
Patient aware of information. Stated verbal understanding.

## 2017-07-21 NOTE — Telephone Encounter (Signed)
Ok to use loratadine

## 2017-07-21 NOTE — Telephone Encounter (Signed)
Copied from CRM [phone]. Topic: Quick Communication - See Telephone Encounter >> Jul 21, 2017 11:26 AM Herby AbrahamJohnson, Shiquita C wrote: CRM for notification. See Telephone encounter for: 07/21/17.   Pt called in to be advised. Pt says that she was told by provider to not take any other medication. Pt says that her allergies are bothering her, she would like to know if it is okay to take medication?  ([facility] loratadine antihistamine)   CB: [phone]

## 2017-07-31 ENCOUNTER — Telehealth: Payer: Self-pay | Admitting: Emergency Medicine

## 2017-07-31 NOTE — Telephone Encounter (Signed)
Acyclovir is not recommended in pregnancy unless symptoms of herpes are severe.  For nausea I would recommend that she start a ginger supplement and OTC Pyridoxine (B6) 10 mg up to 3 x daily as needed for nausea. This is usually very effective and is safe.   Is she still drinking? Taking her prenatal vitamins as recommended until appointment with OB?

## 2017-07-31 NOTE — Telephone Encounter (Signed)
Called patient and she stated that her herpes symptoms are not severe at this time. Gave patient the recommendations for the ginger and B6 10mg  up to 3Xdaily and she stated that she will get these and try them. Patient stated that it has been a week since she last had anything to drink. Also patient stated that she is still taking the prenatal vitamins.

## 2017-07-31 NOTE — Telephone Encounter (Signed)
Spoke with patient and advised the Acyclovir is to be taken as needed. Recommend contacting GYN. Patient has appt on 08/09/17 but is a ultrasound unsure if with Provider. Patient is agreeable. Patient also states she is having a lot of nausea with pregnancy. If PCP could prescribe something to help.  PCP advised that medication is to be taken as needed. Recommend to reach out to GYN for confirmation and verification of medications to take during pregnancy.  07/29/17 5:15p Patient called [facility] after hours for advise about taking the Acyclovir for Herpes while she is pregnant.

## 2017-08-03 ENCOUNTER — Other Ambulatory Visit (INDEPENDENT_AMBULATORY_CARE_PROVIDER_SITE_OTHER): Payer: BLUE CROSS/BLUE SHIELD

## 2017-08-03 DIAGNOSIS — R748 Abnormal levels of other serum enzymes: Secondary | ICD-10-CM | POA: Diagnosis not present

## 2017-08-03 LAB — HEPATIC FUNCTION PANEL
ALBUMIN: 4.3 g/dL (ref 3.5–5.2)
ALT: 114 U/L — AB (ref 0–35)
AST: 45 U/L — ABNORMAL HIGH (ref 0–37)
Alkaline Phosphatase: 67 U/L (ref 39–117)
BILIRUBIN DIRECT: 0.1 mg/dL (ref 0.0–0.3)
Total Bilirubin: 0.5 mg/dL (ref 0.2–1.2)
Total Protein: 6.8 g/dL (ref 6.0–8.3)

## 2017-08-07 ENCOUNTER — Other Ambulatory Visit: Payer: Self-pay | Admitting: Emergency Medicine

## 2017-08-07 DIAGNOSIS — R748 Abnormal levels of other serum enzymes: Secondary | ICD-10-CM

## 2017-08-08 ENCOUNTER — Other Ambulatory Visit: Payer: Self-pay | Admitting: Obstetrics & Gynecology

## 2017-08-08 DIAGNOSIS — O3680X0 Pregnancy with inconclusive fetal viability, not applicable or unspecified: Secondary | ICD-10-CM

## 2017-08-09 ENCOUNTER — Ambulatory Visit (INDEPENDENT_AMBULATORY_CARE_PROVIDER_SITE_OTHER): Payer: BLUE CROSS/BLUE SHIELD

## 2017-08-09 DIAGNOSIS — O3680X0 Pregnancy with inconclusive fetal viability, not applicable or unspecified: Secondary | ICD-10-CM | POA: Diagnosis not present

## 2017-08-09 DIAGNOSIS — Z3A08 8 weeks gestation of pregnancy: Secondary | ICD-10-CM | POA: Diagnosis not present

## 2017-08-09 NOTE — Progress Notes (Signed)
US 8+3 wks,single IUP w/ ys,positive fht 176 bpm,normal ovaries bilat,crl 19.93 mm,EDD 03/18/2018 by [place]

## 2017-08-16 ENCOUNTER — Encounter: Payer: Self-pay | Admitting: Physician Assistant

## 2017-08-16 ENCOUNTER — Other Ambulatory Visit: Payer: Self-pay

## 2017-08-16 ENCOUNTER — Ambulatory Visit: Payer: BLUE CROSS/BLUE SHIELD | Admitting: Physician Assistant

## 2017-08-16 VITALS — BP 100/60 | HR 77 | Temp 98.5°F | Resp 14 | Ht 65.0 in | Wt 176.0 lb

## 2017-08-16 DIAGNOSIS — B9689 Other specified bacterial agents as the cause of diseases classified elsewhere: Secondary | ICD-10-CM | POA: Diagnosis not present

## 2017-08-16 DIAGNOSIS — J019 Acute sinusitis, unspecified: Secondary | ICD-10-CM | POA: Diagnosis not present

## 2017-08-16 MED ORDER — AMOXICILLIN 875 MG PO TABS: 875.0000 mg | ORAL_TABLET | Freq: Two times a day (BID) | ORAL | 0 refills | Status: DC

## 2017-08-16 NOTE — Patient Instructions (Signed)
Please take antibiotic as directed.  Increase fluid intake.  Use Saline nasal spray.  Take a daily multivitamin. Restart your B6 for nausea. You can take Loratadine (Claritin) for allergies. Tylenol for headache.  Place a humidifier in the bedroom.  Please call or return clinic if symptoms are not improving.  Sinusitis Sinusitis is redness, soreness, and swelling (inflammation) of the paranasal sinuses. Paranasal sinuses are air pockets within the bones of your face (beneath the eyes, the middle of the forehead, or above the eyes). In healthy paranasal sinuses, mucus is able to drain out, and air is able to circulate through them by way of your nose. However, when your paranasal sinuses are inflamed, mucus and air can become trapped. This can allow bacteria and other germs to grow and cause infection. Sinusitis can develop quickly and last only a short time (acute) or continue over a long period (chronic). Sinusitis that lasts for more than 12 weeks is considered chronic.  CAUSES  Causes of sinusitis include:  Allergies.  Structural abnormalities, such as displacement of the cartilage that separates your nostrils (deviated septum), which can decrease the air flow through your nose and sinuses and affect sinus drainage.  Functional abnormalities, such as when the small hairs (cilia) that line your sinuses and help remove mucus do not work properly or are not present. SYMPTOMS  Symptoms of acute and chronic sinusitis are the same. The primary symptoms are pain and pressure around the affected sinuses. Other symptoms include:  Upper toothache.  Earache.  Headache.  Bad breath.  Decreased sense of smell and taste.  A cough, which worsens when you are lying flat.  Fatigue.  Fever.  Thick drainage from your nose, which often is green and may contain pus (purulent).  Swelling and warmth over the affected sinuses. DIAGNOSIS  Your caregiver will perform a physical exam. During the exam,  your caregiver may:  Look in your nose for signs of abnormal growths in your nostrils (nasal polyps).  Tap over the affected sinus to check for signs of infection.  View the inside of your sinuses (endoscopy) with a special imaging device with a light attached (endoscope), which is inserted into your sinuses. If your caregiver suspects that you have chronic sinusitis, one or more of the following tests may be recommended:  Allergy tests.  Nasal culture A sample of mucus is taken from your nose and sent to a lab and screened for bacteria.  Nasal cytology A sample of mucus is taken from your nose and examined by your caregiver to determine if your sinusitis is related to an allergy. TREATMENT  Most cases of acute sinusitis are related to a viral infection and will resolve on their own within 10 days. Sometimes medicines are prescribed to help relieve symptoms (pain medicine, decongestants, nasal steroid sprays, or saline sprays).  However, for sinusitis related to a bacterial infection, your caregiver will prescribe antibiotic medicines. These are medicines that will help kill the bacteria causing the infection.  Rarely, sinusitis is caused by a fungal infection. In theses cases, your caregiver will prescribe antifungal medicine. For some cases of chronic sinusitis, surgery is needed. Generally, these are cases in which sinusitis recurs more than 3 times per year, despite other treatments. HOME CARE INSTRUCTIONS   Drink plenty of water. Water helps thin the mucus so your sinuses can drain more easily.  Use a humidifier.  Inhale steam 3 to 4 times a day (for example, sit in the bathroom with the shower running).  Apply a warm, moist washcloth to your face 3 to 4 times a day, or as directed by your caregiver.  Use saline nasal sprays to help moisten and clean your sinuses.  Take over-the-counter or prescription medicines for pain, discomfort, or fever only as directed by your  caregiver. SEEK IMMEDIATE MEDICAL CARE IF:  You have increasing pain or severe headaches.  You have nausea, vomiting, or drowsiness.  You have swelling around your face.  You have vision problems.  You have a stiff neck.  You have difficulty breathing. MAKE SURE YOU:   Understand these instructions.  Will watch your condition.  Will get help right away if you are not doing well or get worse. Document Released: 04/11/2005 Document Revised: 07/04/2011 Document Reviewed: 04/26/2011 [facility] Patient Information 2014 [place], [place].

## 2017-08-16 NOTE — Progress Notes (Signed)
Patient presents to clinic today c/o 4 weeks of sinus pressure, nasal congestion with thick rhinorrhea, now with sinus pain and fatigue. Is currently [redacted] weeks pregnant. Is taking OTC antihistamine to help with allergy symptoms. Is only noting mild improvement. Denies recent travel or sick contact. Has follow-up with OB on 08/23/17.  Past Medical History:  Diagnosis Date  . Depression   . HSV-2 infection     Current Outpatient Medications on File Prior to Visit  Medication Sig Dispense Refill  . Prenatal Vit-Fe Fumarate-FA (MULTIVITAMIN-PRENATAL) 27-0.8 MG TABS tablet Take 1 tablet by mouth daily at 12 noon.     No current facility-administered medications on file prior to visit.     No Known Allergies  Family History  Problem Relation Age of Onset  . Other Mother        Bone deteriation  . Alcohol abuse Mother   . Cirrhosis Father   . Alcohol abuse Father   . Diabetes Brother   . Alcohol abuse Maternal Grandmother   . Alcohol abuse Maternal Grandfather   . Alcohol abuse Paternal Grandmother   . Alcohol abuse Paternal Grandfather     Social History   Socioeconomic History  . Marital status: Single    Spouse name: Not on file  . Number of children: Not on file  . Years of education: Not on file  . Highest education level: Not on file  Occupational History  . Not on file  Social Needs  . Financial resource strain: Not on file  . Food insecurity:    Worry: Not on file    Inability: Not on file  . Transportation needs:    Medical: Not on file    Non-medical: Not on file  Tobacco Use  . Smoking status: Current Every Day Smoker    Packs/day: 0.25    Types: Cigarettes  . Smokeless tobacco: Never Used  Substance and Sexual Activity  . Alcohol use: Yes    Comment: 5 shots of liquor per day  . Drug use: Yes    Types: Marijuana  . Sexual activity: Yes    Birth control/protection: Condom  Lifestyle  . Physical activity:    Days per week: Not on file    Minutes per  session: Not on file  . Stress: Not on file  Relationships  . Social connections:    Talks on phone: Not on file    Gets together: Not on file    Attends religious service: Not on file    Active member of club or organization: Not on file    Attends meetings of clubs or organizations: Not on file    Relationship status: Not on file  Other Topics Concern  . Not on file  Social History Narrative  . Not on file    Review of Systems - See HPI.  All other ROS are negative.  BP 100/60   Pulse 77   Temp 98.5 F (36.9 C) (Oral)   Resp 14   Ht 5\' 5"  (1.651 m)   Wt 176 lb (79.8 kg)   LMP  (LMP Unknown)   SpO2 99%   BMI 29.29 kg/m   Physical Exam  Constitutional: She is oriented to person, place, and time. She appears well-developed and well-nourished.  HENT:  Head: Normocephalic and atraumatic.  Right Ear: Tympanic membrane normal.  Left Ear: Tympanic membrane normal.  Nose: Mucosal edema and rhinorrhea present. Right sinus exhibits maxillary sinus tenderness. Left sinus exhibits maxillary sinus tenderness.  Mouth/Throat: Uvula is midline, oropharynx is clear and moist and mucous membranes are normal.  Eyes: Conjunctivae are normal.  Neck: Neck supple. No thyromegaly present.  Cardiovascular: Normal rate, regular rhythm and normal heart sounds.  Pulmonary/Chest: Effort normal and breath sounds normal. No stridor. No respiratory distress. She has no wheezes. She has no rales. She exhibits no tenderness.  Lymphadenopathy:    She has no cervical adenopathy.  Neurological: She is alert and oriented to person, place, and time.  Skin: Skin is warm.  Psychiatric: She has a normal mood and affect.  Vitals reviewed.   Recent Results (from the past 2160 hour(s))  Urinalysis, Routine w reflex microscopic     Status: None   Collection Time: 07/17/17  2:17 PM  Result Value Ref Range   Color, Urine YELLOW Yellow;Lt. Yellow   APPearance CLEAR Clear   Specific Gravity, Urine 1.010 1.000  - 1.030   pH 6.0 5.0 - 8.0   Total Protein, Urine NEGATIVE Negative   Urine Glucose NEGATIVE Negative   Ketones, ur NEGATIVE Negative   Bilirubin Urine NEGATIVE Negative   Hgb urine dipstick NEGATIVE Negative   Urobilinogen, UA 0.2 0.0 - 1.0   Leukocytes, UA NEGATIVE Negative   Nitrite NEGATIVE Negative   WBC, UA 0-2/hpf 0-2/hpf   RBC / HPF none seen 0-2/hpf   Squamous Epithelial / LPF Rare(0-4/hpf) Rare(0-4/hpf)  B-HCG Quant     Status: None   Collection Time: 07/17/17  2:17 PM  Result Value Ref Range   Quantitative HCG 14,655.00 mIU/ml    Comment: Non-Pregnant Females >74yr and <61yr=0.0-0.6(mIU/ml)Non-Pregnant Females >82yrs=0.0-3.1(mIU/ml)Non-Pregnant Females Post-Menopause0.1-11.6(mIU/ml)  CBC with Differential/Platelet     Status: None   Collection Time: 07/17/17  2:17 PM  Result Value Ref Range   WBC 8.9 4.0 - 10.5 K/uL   RBC 4.49 3.87 - 5.11 Mil/uL   Hemoglobin 14.4 12.0 - 15.0 g/dL   HCT 96.0 45.4 - 09.8 %   MCV 93.6 78.0 - 100.0 fl   MCHC 34.3 30.0 - 36.0 g/dL   RDW 11.9 14.7 - 82.9 %   Platelets 236.0 150.0 - 400.0 K/uL   Neutrophils Relative % 72.2 43.0 - 77.0 %   Lymphocytes Relative 16.9 12.0 - 46.0 %   Monocytes Relative 6.8 3.0 - 12.0 %   Eosinophils Relative 3.7 0.0 - 5.0 %   Basophils Relative 0.4 0.0 - 3.0 %   Neutro Abs 6.4 1.4 - 7.7 K/uL   Lymphs Abs 1.5 0.7 - 4.0 K/uL   Monocytes Absolute 0.6 0.1 - 1.0 K/uL   Eosinophils Absolute 0.3 0.0 - 0.7 K/uL   Basophils Absolute 0.0 0.0 - 0.1 K/uL  Comprehensive metabolic panel     Status: Abnormal   Collection Time: 07/17/17  2:17 PM  Result Value Ref Range   Sodium 135 135 - 145 mEq/L   Potassium 4.0 3.5 - 5.1 mEq/L   Chloride 101 96 - 112 mEq/L   CO2 28 19 - 32 mEq/L   Glucose, Bld 103 (H) 70 - 99 mg/dL   BUN 8 6 - 23 mg/dL   Creatinine, Ser 5.62 0.40 - 1.20 mg/dL   Total Bilirubin 0.5 0.2 - 1.2 mg/dL   Alkaline Phosphatase 48 39 - 117 U/L   AST 52 (H) 0 - 37 U/L   ALT 104 (H) 0 - 35 U/L   Total  Protein 7.4 6.0 - 8.3 g/dL   Albumin 4.5 3.5 - 5.2 g/dL   Calcium 13.0 8.4 - 86.5 mg/dL  GFR 122.22 >60.00 mL/min  TSH     Status: None   Collection Time: 07/17/17  2:17 PM  Result Value Ref Range   TSH 2.14 0.35 - 4.50 uIU/mL  Hemoglobin A1c     Status: None   Collection Time: 07/17/17  2:17 PM  Result Value Ref Range   Hgb A1c MFr Bld 5.3 4.6 - 6.5 %    Comment: Glycemic Control Guidelines for People with Diabetes:Non Diabetic:  <6%Goal of Therapy: <7%Additional Action Suggested:  >8%   Lipid panel     Status: None   Collection Time: 07/17/17  2:17 PM  Result Value Ref Range   Cholesterol 159 0 - 200 mg/dL    Comment: ATP III Classification       Desirable:  < 200 mg/dL               Borderline High:  200 - 239 mg/dL          High:  > = 161240 mg/dL   Triglycerides 09.642.0 0.0 - 149.0 mg/dL    Comment: Normal:  <045<150 mg/dLBorderline High:  150 - 199 mg/dL   HDL 40.9865.00 >11.91>39.00 mg/dL   VLDL 8.4 0.0 - 47.840.0 mg/dL   LDL Cholesterol 85 0 - 99 mg/dL   Total CHOL/HDL Ratio 2     Comment:                Men          Women1/2 Average Risk     3.4          3.3Average Risk          5.0          4.42X Average Risk          9.6          7.13X Average Risk          15.0          11.0                       NonHDL 93.61     Comment: NOTE:  Non-HDL goal should be 30 mg/dL higher than patient's LDL goal (i.e. LDL goal of < 70 mg/dL, would have non-HDL goal of < 100 mg/dL)  Acute Hep Panel & Hep B Surface Ab     Status: None   Collection Time: 07/17/17  2:17 PM  Result Value Ref Range   Hep A IgM NON-REACTIVE NON-REACTI   Hepatitis B Surface Ag NON-REACTIVE NON-REACTI   Hep B C IgM NON-REACTIVE NON-REACTI   HEPATITIS C ANTIBODY REFILL$(REFL) NON-REACTIVE NON-REACTI   SIGNAL TO CUT-OFF 0.01 <1.00    Comment: . HCV antibody was non-reactive. There is no laboratory  evidence of HCV infection. . In most cases, no further action is required. However, if recent HCV exposure is suspected, a test for HCV  RNA (test code 2956235645) is suggested. . For additional information please refer to http://education.[facility].com/faq/FAQ22v1 (This link is being provided for informational/ educational purposes only.) .   HIV antibody (with reflex)     Status: None   Collection Time: 07/17/17  2:17 PM  Result Value Ref Range   HIV 1&2 Ab, 4th Generation NON-REACTIVE NON-REACTI    Comment: HIV-1 antigen and HIV-1/HIV-2 antibodies were not detected. There is no laboratory evidence of HIV infection. Marland Kitchen. PLEASE NOTE: This information has been disclosed to you from records whose confidentiality may be protected by state law.  If your  state requires such protection, then the state law prohibits you from making any further disclosure of the information without the specific written consent of the person to whom it pertains, or as otherwise permitted by law. A general authorization for the release of medical or other information is NOT sufficient for this purpose. . For additional information please refer to http://education.[facility].com/faq/FAQ106 (This link is being provided for informational/ educational purposes only.) . Marland Kitchen The performance of this assay has not been clinically validated in patients less than 24 years old. Marland Kitchen   REFLEX TIQ     Status: None   Collection Time: 07/17/17  2:17 PM  Result Value Ref Range   REFLEX TIQ      Comment: OUR RECORDS INDICATE THAT YOU HAVE ORDERED ACUTE HEP PNL (REFL) ORDER CODE [id number].  THIS IS A REFLEX-SPECIFIC ORDER CODE. HOWEVER, ONLY THE INITIAL TEST WAS PERFORMED, BECAUSE WE DO NOT HAVE A REFLEX TESTING AUTHORIZATION FORM ON FILE FOR YOU.  TO  PERFORM A REFLEX TEST WE NEED YOU TO SIGN AN AUTHORIZATION FORM  SPECIFYING (A) THE REFLEXIVE TEST AND (B) THE RESULTS THAT WILL  TRIGGER THE PERFORMANCE OF THE REFLEX TEST.  PLEASE CONTACT THE  CLIENT SERVICE REPRESENTATIVE AT [facility] IF YOU WOULD  LIKE ADDITIONAL TESTING DONE OR  CONTACT YOUR SALES REPRESENTATIVE  TO OBTAIN A COPY OF THE REFLEXIVE TESTING AUTHORIZATION FORM. . .   Hepatic function panel     Status: Abnormal   Collection Time: 08/03/17  9:21 AM  Result Value Ref Range   Total Bilirubin 0.5 0.2 - 1.2 mg/dL   Bilirubin, Direct 0.1 0.0 - 0.3 mg/dL   Alkaline Phosphatase 67 39 - 117 U/L   AST 45 (H) 0 - 37 U/L   ALT 114 (H) 0 - 35 U/L   Total Protein 6.8 6.0 - 8.3 g/dL   Albumin 4.3 3.5 - 5.2 g/dL    Assessment/Plan: 1. Acute bacterial sinusitis In pregnant patient. Discussed proper/safe OTC medications. Can use Claritin. Restart B6 and continue Prenatal vitamins. Start saline rinses. Rx Amoxicillin. Follow-up with OB as scheduled.  - amoxicillin (AMOXIL) 875 MG tablet; Take 1 tablet (875 mg total) by mouth 2 (two) times daily.  Dispense: 14 tablet; Refill: 0   Piedad Climes, PA-C

## 2017-08-17 ENCOUNTER — Other Ambulatory Visit: Payer: BLUE CROSS/BLUE SHIELD

## 2017-08-22 ENCOUNTER — Ambulatory Visit
Admission: RE | Admit: 2017-08-22 | Discharge: 2017-08-22 | Disposition: A | Discharge status: Home / Self Care | Payer: BLUE CROSS/BLUE SHIELD | Source: Ambulatory Visit | Attending: Physician Assistant | Admitting: Physician Assistant

## 2017-08-22 DIAGNOSIS — R748 Abnormal levels of other serum enzymes: Secondary | ICD-10-CM | POA: Diagnosis not present

## 2017-08-23 ENCOUNTER — Ambulatory Visit: Payer: BLUE CROSS/BLUE SHIELD | Admitting: *Deleted

## 2017-08-23 ENCOUNTER — Ambulatory Visit (INDEPENDENT_AMBULATORY_CARE_PROVIDER_SITE_OTHER): Payer: BLUE CROSS/BLUE SHIELD | Admitting: Advanced Practice Midwife

## 2017-08-23 ENCOUNTER — Encounter: Payer: Self-pay | Admitting: Advanced Practice Midwife

## 2017-08-23 VITALS — BP 102/70 | HR 80 | Wt 184.0 lb

## 2017-08-23 DIAGNOSIS — B009 Herpesviral infection, unspecified: Secondary | ICD-10-CM

## 2017-08-23 DIAGNOSIS — Z3A10 10 weeks gestation of pregnancy: Secondary | ICD-10-CM

## 2017-08-23 DIAGNOSIS — Z3401 Encounter for supervision of normal first pregnancy, first trimester: Secondary | ICD-10-CM

## 2017-08-23 DIAGNOSIS — O99331 Smoking (tobacco) complicating pregnancy, first trimester: Secondary | ICD-10-CM

## 2017-08-23 DIAGNOSIS — Z3682 Encounter for antenatal screening for nuchal translucency: Secondary | ICD-10-CM

## 2017-08-23 DIAGNOSIS — Z3400 Encounter for supervision of normal first pregnancy, unspecified trimester: Secondary | ICD-10-CM | POA: Insufficient documentation

## 2017-08-23 DIAGNOSIS — Z1389 Encounter for screening for other disorder: Secondary | ICD-10-CM

## 2017-08-23 DIAGNOSIS — F17210 Nicotine dependence, cigarettes, uncomplicated: Secondary | ICD-10-CM

## 2017-08-23 DIAGNOSIS — O98511 Other viral diseases complicating pregnancy, first trimester: Secondary | ICD-10-CM

## 2017-08-23 DIAGNOSIS — Z331 Pregnant state, incidental: Secondary | ICD-10-CM

## 2017-08-23 LAB — POCT URINALYSIS DIPSTICK
Blood, UA: NEGATIVE
GLUCOSE UA: NEGATIVE
KETONES UA: NEGATIVE
Leukocytes, UA: NEGATIVE
Nitrite, UA: NEGATIVE

## 2017-08-23 NOTE — Patient Instructions (Addendum)
 First Trimester of Pregnancy The first trimester of pregnancy is from week 1 until the end of week 12 (months 1 through 3). A week after a sperm fertilizes an egg, the egg will implant on the wall of the uterus. This embryo will begin to develop into a baby. Genes from you and your partner are forming the baby. The female genes determine whether the baby is a boy or a girl. At 6-8 weeks, the eyes and face are formed, and the heartbeat can be seen on ultrasound. At the end of 12 weeks, all the baby's organs are formed.  Now that you are pregnant, you will want to do everything you can to have a healthy baby. Two of the most important things are to get good prenatal care and to follow your health care provider's instructions. Prenatal care is all the medical care you receive before the baby's birth. This care will help prevent, find, and treat any problems during the pregnancy and childbirth. BODY CHANGES Your body goes through many changes during pregnancy. The changes vary from woman to woman.   You may gain or lose a couple of pounds at first.  You may feel sick to your stomach (nauseous) and throw up (vomit). If the vomiting is uncontrollable, call your health care provider.  You may tire easily.  You may develop headaches that can be relieved by medicines approved by your health care provider.  You may urinate more often. Painful urination may mean you have a bladder infection.  You may develop heartburn as a result of your pregnancy.  You may develop constipation because certain hormones are causing the muscles that push waste through your intestines to slow down.  You may develop hemorrhoids or swollen, bulging veins (varicose veins).  Your breasts may begin to grow larger and become tender. Your nipples may stick out more, and the tissue that surrounds them (areola) may become darker.  Your gums may bleed and may be sensitive to brushing and flossing.  Dark spots or blotches  (chloasma, mask of pregnancy) may develop on your face. This will likely fade after the baby is born.  Your menstrual periods will stop.  You may have a loss of appetite.  You may develop cravings for certain kinds of food.  You may have changes in your emotions from day to day, such as being excited to be pregnant or being concerned that something may go wrong with the pregnancy and baby.  You may have more vivid and strange dreams.  You may have changes in your hair. These can include thickening of your hair, rapid growth, and changes in texture. Some women also have hair loss during or after pregnancy, or hair that feels dry or thin. Your hair will most likely return to normal after your baby is born. WHAT TO EXPECT AT YOUR PRENATAL VISITS During a routine prenatal visit:  You will be weighed to make sure you and the baby are growing normally.  Your blood pressure will be taken.  Your abdomen will be measured to track your baby's growth.  The fetal heartbeat will be listened to starting around week 10 or 12 of your pregnancy.  Test results from any previous visits will be discussed. Your health care provider may ask you:  How you are feeling.  If you are feeling the baby move.  If you have had any abnormal symptoms, such as leaking fluid, bleeding, severe headaches, or abdominal cramping.  If you have any questions. Other   tests that may be performed during your first trimester include:  Blood tests to find your blood type and to check for the presence of any previous infections. They will also be used to check for low iron levels (anemia) and Rh antibodies. Later in the pregnancy, blood tests for diabetes will be done along with other tests if problems develop.  Urine tests to check for infections, diabetes, or protein in the urine.  An ultrasound to confirm the proper growth and development of the baby.  An amniocentesis to check for possible genetic problems.  Fetal  screens for spina bifida and Down syndrome.  You may need other tests to make sure you and the baby are doing well. HOME CARE INSTRUCTIONS  Medicines  Follow your health care provider's instructions regarding medicine use. Specific medicines may be either safe or unsafe to take during pregnancy.  Take your prenatal vitamins as directed.  If you develop constipation, try taking a stool softener if your health care provider approves. Diet  Eat regular, well-balanced meals. Choose a variety of foods, such as meat or vegetable-based protein, fish, milk and low-fat dairy products, vegetables, fruits, and whole grain breads and cereals. Your health care provider will help you determine the amount of weight gain that is right for you.  Avoid raw meat and uncooked cheese. These carry germs that can cause birth defects in the baby.  Eating four or five small meals rather than three large meals a day may help relieve nausea and vomiting. If you start to feel nauseous, eating a few soda crackers can be helpful. Drinking liquids between meals instead of during meals also seems to help nausea and vomiting.  If you develop constipation, eat more high-fiber foods, such as fresh vegetables or fruit and whole grains. Drink enough fluids to keep your urine clear or pale yellow.  Avoid alcohol, tobacco, and illicit drugs during pregnancy. There is no known amount of alcohol that is safe for everyone.  Activity and Exercise  Exercise only as directed by your health care provider. Exercising will help you:  Control your weight.  Stay in shape.  Be prepared for labor and delivery.  Experiencing pain or cramping in the lower abdomen or low back is a good sign that you should stop exercising. Check with your health care provider before continuing normal exercises.  Try to avoid standing for long periods of time. Move your legs often if you must stand in one place for a long time.  Avoid heavy  lifting.  Wear low-heeled shoes, and practice good posture.  You may continue to have sex unless your health care provider directs you otherwise. Relief of Pain or Discomfort  Wear a good support bra for breast tenderness.   Take warm sitz baths to soothe any pain or discomfort caused by hemorrhoids. Use hemorrhoid cream if your health care provider approves.   Rest with your legs elevated if you have leg cramps or low back pain.  If you develop varicose veins in your legs, wear support hose. Elevate your feet for 15 minutes, 3-4 times a day. Limit salt in your diet. Prenatal Care  Schedule your prenatal visits by the twelfth week of pregnancy. They are usually scheduled monthly at first, then more often in the last 2 months before delivery.  Write down your questions. Take them to your prenatal visits.  Keep all your prenatal visits as directed by your health care provider. Safety  Wear your seat belt at all times when  driving.  Make a list of emergency phone numbers, including numbers for family, friends, the hospital, and police and fire departments. General Tips  Ask your health care provider for a referral to a local prenatal education class. Begin classes no later than at the beginning of month 6 of your pregnancy.  Ask for help if you have counseling or nutritional needs during pregnancy. Your health care provider can offer advice or refer you to specialists for help with various needs.  Do not use hot tubs, steam rooms, or saunas.  Do not douche or use tampons or scented sanitary pads.  Do not cross your legs for long periods of time.  Avoid cat litter boxes and soil used by cats. These carry germs that can cause birth defects in the baby and possibly loss of the fetus by miscarriage or stillbirth.  Avoid all smoking, herbs, alcohol, and medicines not prescribed by your health care provider. Chemicals in these affect the formation and growth of the baby.  Schedule  a dentist appointment. At home, brush your teeth with a soft toothbrush and be gentle when you floss. SEEK MEDICAL CARE IF:   You have dizziness.  You have mild pelvic cramps, pelvic pressure, or nagging pain in the abdominal area.  You have persistent nausea, vomiting, or diarrhea.  You have a bad smelling vaginal discharge.  You have pain with urination.  You notice increased swelling in your face, hands, legs, or ankles. SEEK IMMEDIATE MEDICAL CARE IF:   You have a fever.  You are leaking fluid from your vagina.  You have spotting or bleeding from your vagina.  You have severe abdominal cramping or pain.  You have rapid weight gain or loss.  You vomit blood or material that looks like coffee grounds.  You are exposed to [place] measles and have never had them.  You are exposed to fifth disease or chickenpox.  You develop a severe headache.  You have shortness of breath.  You have any kind of trauma, such as from a fall or a car accident. Document Released: 04/05/2001 Document Revised: 08/26/2013 Document Reviewed: 02/19/2013 [facility] Patient Information 2015 [place], [place]. This information is not intended to replace advice given to you by your health care provider. Make sure you discuss any questions you have with your health care provider.   Nausea & Vomiting  Have saltine crackers or pretzels by your bed and eat a few bites before you raise your head out of bed in the morning  Eat small frequent meals throughout the day instead of large meals  Drink plenty of fluids throughout the day to stay hydrated, just don't drink a lot of fluids with your meals.  This can make your stomach fill up faster making you feel sick  Do not brush your teeth right after you eat  Products with real ginger are good for nausea, like ginger ale and ginger hard candy Make sure it says made with real ginger!  Sucking on sour candy like lemon heads is also good for nausea  If your  prenatal vitamins make you nauseated, take them at night so you will sleep through the nausea  Sea Bands  If you feel like you need medicine for the nausea & vomiting please let us know  If you are unable to keep any fluids or food down please let us know   Constipation  Drink plenty of fluid, preferably water, throughout the day  Eat foods high in fiber such as fruits, vegetables,  and grains  Exercise, such as walking, is a good way to keep your bowels regular  Drink warm fluids, especially warm prune juice, or decaf coffee  Eat a 1/2 cup of real oatmeal (not instant), 1/2 cup applesauce, and 1/2-1 cup warm prune juice every day  If needed, you may take Colace (docusate sodium) stool softener once or twice a day to help keep the stool soft. If you are pregnant, wait until you are out of your first trimester (12-14 weeks of pregnancy)  If you still are having problems with constipation, you may take Miralax once daily as needed to help keep your bowels regular.  If you are pregnant, wait until you are out of your first trimester (12-14 weeks of pregnancy)  Safe Medications in Pregnancy   Acne: Benzoyl Peroxide Salicylic Acid  Backache/Headache: Tylenol: 2 regular strength every 4 hours OR              2 Extra strength every 6 hours  Colds/Coughs/Allergies: Benadryl (alcohol free) 25 mg every 6 hours as needed Breath right strips Claritin Cepacol throat lozenges Chloraseptic throat spray Cold-Eeze- up to three times per day Cough drops, alcohol free Flonase (by prescription only) Guaifenesin Mucinex Robitussin DM (plain only, alcohol free) Saline nasal spray/drops Sudafed (pseudoephedrine) & Actifed ** use only after [redacted] weeks gestation and if you do not have high blood pressure Tylenol Vicks Vaporub Zinc lozenges Zyrtec   Constipation: Colace Ducolax suppositories Fleet enema Glycerin suppositories Metamucil Milk of magnesia Miralax Senokot Smooth move  tea  Diarrhea: Kaopectate Imodium A-D  *NO pepto Bismol  Hemorrhoids: Anusol Anusol HC Preparation H Tucks  Indigestion: Tums Maalox Mylanta Zantac  Pepcid  Insomnia: Benadryl (alcohol free)  every 6 hours as needed Tylenol PM Unisom, no Gelcaps  Leg Cramps: Tums MagGel  Nausea/Vomiting:  Bonine Dramamine Emetrol Ginger extract Sea bands Meclizine  Nausea medication to take during pregnancy:  Unisom (doxylamine succinate 25 mg tablets) Take one tablet daily at bedtime. If symptoms are not adequately controlled, the dose can be increased to a maximum recommended dose of two tablets daily (1/2 tablet in the morning, 1/2 tablet mid-afternoon and one at bedtime). Vitamin B6  tablets. Take one tablet twice a day (up to 200 mg per day).  Skin Rashes: Aveeno products Benadryl cream or  every 6 hours as needed Calamine Lotion 1% cortisone cream  Yeast infection: Gyne-lotrimin 7 Monistat 7   **If taking multiple medications, please check labels to avoid duplicating the same active ingredients **take medication as directed on the label ** Do not exceed 4000 mg of tylenol in 24 hours **Do not take medications that contain aspirin or ibuprofen

## 2017-08-23 NOTE — Progress Notes (Signed)
  Subjective:    Angela Moran is a G1P0 [redacted]w[redacted]d being seen today for her first obstetrical visit.  Her obstetrical history is significant for first pregnancy. .  Pregnancy history fully reviewed. 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  Denies substance abuse problem. States drinks 1 beer a week. Advised that there is no known safe amount of ETOH during pregnancy.and that cocaine can cause abruption/fetal death.  Smokng much less, encouraged to stop. Cessation strategies discussed, including e cigs.   Patient reports no complaints.  Vitals:   08/23/17 1120  BP: 102/70  Pulse: 80  Weight: 184 lb (83.5 kg)    HISTORY: OB History  Gravida Para Term Preterm AB Living  1            SAB TAB Ectopic Multiple Live Births               # Outcome Date GA Lbr Len/2nd Weight Sex Delivery Anes PTL Lv  1 Current            Past Medical History:  Diagnosis Date  . Depression   . HSV-2 infection   . Vaginal Pap smear, abnormal    Past Surgical History:  Procedure Laterality Date  . NO PAST SURGERIES     Family History  Problem Relation Age of Onset  . Other Mother        Bone deteriation  . Alcohol abuse Mother   . Cirrhosis Father   . Alcohol abuse Father   . Diabetes Brother   . Alcohol abuse Maternal Grandmother   . Alcohol abuse Maternal Grandfather   . Alcohol abuse Paternal Grandmother   . Alcohol abuse Paternal Grandfather   . Autism Brother   . ADD / ADHD Brother      Exam                                      System:     Skin: normal coloration and turgor, no rashes    Neurologic: oriented, normal, normal mood   Extremities: normal strength, tone, and muscle mass   HEENT PERRLA   Mouth/Teeth mucous membranes moist, normal dentition   Neck supple and no masses   Cardiovascular: regular rate and rhythm   Respiratory:  appears well, vitals normal, no respiratory distress, acyanotic   Abdomen: soft, non-tender;  FHR: 160 [place]      The nature of [facility] with multiple MDs and other Advanced Practice Providers was explained to patient; also emphasized that residents, students are part of our team.  Assessment:    Pregnancy: G1P0 Patient Active Problem List   Diagnosis Date Noted  . Supervision of normal first pregnancy 08/23/2017  . History of illicit drug use 07/17/2017  . 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 Visit for preventive health examination 02/01/2017  . Anxiety disorder 12/04/2013        Plan:     Initial labs drawn. Continue prenatal vitamins  Problem list reviewed and updated  Reviewed n/v relief measures and warning s/s to report  Reviewed recommended weight gain based on pre-gravid BMI  Encouraged well-balanced diet Genetic Screening discussed Integrated Screen: requested.  Ultrasound discussed; fetal survey: requested.  No follow-ups on file.  Jacklyn Shell 08/23/2017

## 2017-08-24 LAB — OBSTETRIC PANEL, INCLUDING HIV
Antibody Screen: NEGATIVE
BASOS ABS: 0 10*3/uL (ref 0.0–0.2)
Basos: 0 %
EOS (ABSOLUTE): 0.4 10*3/uL (ref 0.0–0.4)
Eos: 4 %
HIV SCREEN 4TH GENERATION: NONREACTIVE
Hematocrit: 39.7 % (ref 34.0–46.6)
Hemoglobin: 13.4 g/dL (ref 11.1–15.9)
Hepatitis B Surface Ag: NEGATIVE
IMMATURE GRANULOCYTES: 0 %
Immature Grans (Abs): 0 10*3/uL (ref 0.0–0.1)
LYMPHS: 12 %
Lymphocytes Absolute: 1.1 10*3/uL (ref 0.7–3.1)
MCH: 31.2 pg (ref 26.6–33.0)
MCHC: 33.8 g/dL (ref 31.5–35.7)
MCV: 93 fL (ref 79–97)
MONOCYTES: 6 %
Monocytes Absolute: 0.6 10*3/uL (ref 0.1–0.9)
NEUTROS ABS: 7.7 10*3/uL — AB (ref 1.4–7.0)
NEUTROS PCT: 78 %
PLATELETS: 239 10*3/uL (ref 150–379)
RBC: 4.29 x10E6/uL (ref 3.77–5.28)
RDW: 13.8 % (ref 12.3–15.4)
RPR Ser Ql: NONREACTIVE
RUBELLA: 1.28 {index} (ref >0.99)
Rh Factor: POSITIVE
WBC: 9.8 10*3/uL (ref 3.4–10.8)

## 2017-08-24 LAB — PMP SCREEN PROFILE (10S), URINE
AMPHETAMINE SCREEN URINE: NEGATIVE ng/mL
BARBITURATE SCREEN URINE: NEGATIVE ng/mL
BENZODIAZEPINE SCREEN, URINE: NEGATIVE ng/mL
CANNABINOIDS UR QL SCN: NEGATIVE ng/mL
CREATININE(CRT), U: 257.6 mg/dL (ref 20.0–300.0)
Cocaine (Metab) Scrn, Ur: NEGATIVE ng/mL
Methadone Screen, Urine: NEGATIVE ng/mL
OPIATE SCREEN URINE: NEGATIVE ng/mL
OXYCODONE+OXYMORPHONE UR QL SCN: NEGATIVE ng/mL
Ph of Urine: 5.8 (ref 4.5–8.9)
Phencyclidine Qn, Ur: NEGATIVE ng/mL
Propoxyphene Scrn, Ur: NEGATIVE ng/mL

## 2017-08-24 LAB — MICROSCOPIC EXAMINATION: Casts: NONE SEEN /lpf

## 2017-08-24 LAB — URINALYSIS, ROUTINE W REFLEX MICROSCOPIC
BILIRUBIN UA: NEGATIVE
Glucose, UA: NEGATIVE
Ketones, UA: NEGATIVE
Nitrite, UA: NEGATIVE
PH UA: 5 (ref 5.0–7.5)
PROTEIN UA: NEGATIVE
RBC UA: NEGATIVE
SPEC GRAV UA: 1.027 (ref 1.005–1.030)
UUROB: 0.2 mg/dL (ref 0.2–1.0)

## 2017-08-24 LAB — MED LIST OPTION NOT SELECTED

## 2017-08-25 LAB — URINE CULTURE: ORGANISM ID, BACTERIA: NO GROWTH

## 2017-09-14 ENCOUNTER — Encounter: Payer: BLUE CROSS/BLUE SHIELD | Admitting: Women's Health

## 2017-09-14 ENCOUNTER — Other Ambulatory Visit: Payer: BLUE CROSS/BLUE SHIELD

## 2017-09-15 ENCOUNTER — Telehealth: Payer: Self-pay | Admitting: Obstetrics & Gynecology

## 2017-09-15 NOTE — Telephone Encounter (Signed)
Pt called stating that she had some spotting a few days ago after having sex. She states that it was very light. Advised pt that it is not uncommon to have some spotting after sex. Advised pt to not have intercourse until 7 days after the bleeding has stopped. She has no c/o cramping. Pt also states that she missed her appt for genetic testing which puts her past the cutoff to have it done. She asks if there are any other tests that can be done. Advised that she could discuss an AFP with the provider at her next visit. Advised pt that if she experienced an increase in bleeding over the weekend she should go to [facility] for evaluation.

## 2017-09-26 ENCOUNTER — Encounter: Payer: Self-pay | Admitting: Obstetrics & Gynecology

## 2017-09-26 ENCOUNTER — Ambulatory Visit (INDEPENDENT_AMBULATORY_CARE_PROVIDER_SITE_OTHER): Payer: BLUE CROSS/BLUE SHIELD | Admitting: Obstetrics & Gynecology

## 2017-09-26 VITALS — BP 117/75 | HR 83 | Wt 172.5 lb

## 2017-09-26 DIAGNOSIS — Z3A15 15 weeks gestation of pregnancy: Secondary | ICD-10-CM

## 2017-09-26 DIAGNOSIS — Z331 Pregnant state, incidental: Secondary | ICD-10-CM

## 2017-09-26 DIAGNOSIS — Z3402 Encounter for supervision of normal first pregnancy, second trimester: Secondary | ICD-10-CM | POA: Diagnosis not present

## 2017-09-26 DIAGNOSIS — Z1389 Encounter for screening for other disorder: Secondary | ICD-10-CM

## 2017-09-26 DIAGNOSIS — Z369 Encounter for antenatal screening, unspecified: Secondary | ICD-10-CM | POA: Diagnosis not present

## 2017-09-26 LAB — POCT URINALYSIS DIPSTICK
Blood, UA: NEGATIVE
Glucose, UA: NEGATIVE
Ketones, UA: NEGATIVE
LEUKOCYTES UA: NEGATIVE
NITRITE UA: NEGATIVE
PROTEIN UA: NEGATIVE

## 2017-09-26 NOTE — Progress Notes (Signed)
G1P0 [id number] Estimated Date of Delivery: 03/18/18  Blood pressure 117/75, pulse 83, weight 172 lb 8 oz (78.2 kg).   BP weight and urine results all reviewed and noted.  Please refer to the obstetrical flow sheet for the fundal height and fetal heart rate documentation:  Patient reports good fetal movement, denies any bleeding and no rupture of membranes symptoms or regular contractions. Patient is without complaints. All questions were answered.  Orders Placed This Encounter  Procedures  . US OB Comp + 14 Wk  . AFP TETRA  . POCT urinalysis dipstick    Plan:  Continued routine obstetrical care, missed her NT sonogram will do quad screen today  Return in about 1 month (around 10/24/2017) for 20 week sono, LROB.

## 2017-09-28 LAB — AFP TETRA
DIA Mom Value: 1.17
DIA Value (EIA): 200.68 pg/mL
DSR (BY AGE) 1 IN: 842
DSR (Second Trimester) 1 IN: 3183
GESTATIONAL AGE AFP: 15.2 wk
MSAFP MOM: 0.85
MSAFP: 22.5 ng/mL
MSHCG MOM: 1.12
MSHCG: 57288 m[IU]/mL
Maternal Age At EDD: 28.2 yr
OSB RISK: 10000
T18 (By Age): 1:3281 {titer}
Test Results:: NEGATIVE
UE3 MOM: 1.29
UE3 VALUE: 0.87 ng/mL
Weight: 173 [lb_av]

## 2017-10-02 ENCOUNTER — Encounter: Payer: Self-pay | Admitting: Physician Assistant

## 2017-10-02 ENCOUNTER — Ambulatory Visit: Payer: BLUE CROSS/BLUE SHIELD | Admitting: Physician Assistant

## 2017-10-02 ENCOUNTER — Other Ambulatory Visit: Payer: Self-pay

## 2017-10-02 VITALS — BP 108/70 | HR 95 | Temp 98.5°F | Resp 16 | Ht 65.0 in | Wt 172.0 lb

## 2017-10-02 DIAGNOSIS — J0100 Acute maxillary sinusitis, unspecified: Secondary | ICD-10-CM | POA: Diagnosis not present

## 2017-10-02 MED ORDER — AMOXICILLIN 875 MG PO TABS: 875.0000 mg | ORAL_TABLET | Freq: Two times a day (BID) | ORAL | 0 refills | Status: DC

## 2017-10-02 NOTE — Progress Notes (Signed)
Patient presents to clinic today c/o 3 weeks of sinus pressure, sinus pain, ear pain and fatigue. Denies fever, chills. Notes sinus headache and cough that is sometimes dry and other times productive of yellow sputum. Notes significant PND. Patient is currently pregnant (followed by Dr. Despina Hidden at [facility]). Is using saline nasal rinse to help with congestion.   Past Medical History:  Diagnosis Date  . Depression   . HSV-2 infection   . Vaginal Pap smear, abnormal     Current Outpatient Medications on File Prior to Visit  Medication Sig Dispense Refill  . Prenatal Vit-Fe Fumarate-FA (MULTIVITAMIN-PRENATAL) 27-0.8 MG TABS tablet Take 1 tablet by mouth daily at 12 noon.    Marland Kitchen UNABLE TO FIND OTC allergy med-prn     No current facility-administered medications on file prior to visit.     No Known Allergies  Family History  Problem Relation Age of Onset  . Other Mother        Bone deteriation  . Alcohol abuse Mother   . Cirrhosis Father   . Alcohol abuse Father   . Diabetes Brother   . Alcohol abuse Maternal Grandmother   . Alcohol abuse Maternal Grandfather   . Alcohol abuse Paternal Grandmother   . Alcohol abuse Paternal Grandfather   . Autism Brother   . ADD / ADHD Brother     Social History   Socioeconomic History  . Marital status: Single    Spouse name: Not on file  . Number of children: Not on file  . Years of education: Not on file  . Highest education level: Not on file  Occupational History  . Not on file  Social Needs  . Financial resource strain: Not on file  . Food insecurity:    Worry: Not on file    Inability: Not on file  . Transportation needs:    Medical: Not on file    Non-medical: Not on file  Tobacco Use  . Smoking status: Current Every Day Smoker    Packs/day: 0.00    Types: Cigarettes  . Smokeless tobacco: Never Used  . Tobacco comment: 6 cigs per day  Substance and Sexual Activity  . Alcohol use: Not Currently    Comment: 1  beer per week; not now  . Drug use: Not Currently    Types: Marijuana, Cocaine  . Sexual activity: Yes    Birth control/protection: Condom, None  Lifestyle  . Physical activity:    Days per week: Not on file    Minutes per session: Not on file  . Stress: Not on file  Relationships  . Social connections:    Talks on phone: Not on file    Gets together: Not on file    Attends religious service: Not on file    Active member of club or organization: Not on file    Attends meetings of clubs or organizations: Not on file    Relationship status: Not on file  Other Topics Concern  . Not on file  Social History Narrative  . Not on file   Review of Systems - See HPI.  All other ROS are negative.  BP 108/70   Pulse 95   Temp 98.5 F (36.9 C) (Oral)   Resp 16   Ht 5\' 5"  (1.651 m)   Wt 172 lb (78 kg)   LMP  (LMP Unknown)   SpO2 99%   BMI 28.62 kg/m   Physical Exam  Constitutional: She is oriented to  person, place, and time. She appears well-developed and well-nourished.  HENT:  Head: Normocephalic and atraumatic.  Nose: Mucosal edema and rhinorrhea present. Right sinus exhibits maxillary sinus tenderness. Left sinus exhibits maxillary sinus tenderness.  Mouth/Throat: Uvula is midline, oropharynx is clear and moist and mucous membranes are normal.  Eyes: Pupils are equal, round, and reactive to light.  Neck: Neck supple.  Cardiovascular: Normal rate, regular rhythm, normal heart sounds and intact distal pulses.  Pulmonary/Chest: Effort normal and breath sounds normal. No stridor. No respiratory distress. She has no wheezes.  Lymphadenopathy:    She has no cervical adenopathy.  Neurological: She is alert and oriented to person, place, and time.  Vitals reviewed.  Recent Results (from the past 2160 hour(s))  Urinalysis, Routine w reflex microscopic     Status: None   Collection Time: 07/17/17  2:17 PM  Result Value Ref Range   Color, Urine YELLOW Yellow;Lt. Yellow    APPearance CLEAR Clear   Specific Gravity, Urine 1.010 1.000 - 1.030   pH 6.0 5.0 - 8.0   Total Protein, Urine NEGATIVE Negative   Urine Glucose NEGATIVE Negative   Ketones, ur NEGATIVE Negative   Bilirubin Urine NEGATIVE Negative   Hgb urine dipstick NEGATIVE Negative   Urobilinogen, UA 0.2 0.0 - 1.0   Leukocytes, UA NEGATIVE Negative   Nitrite NEGATIVE Negative   WBC, UA 0-2/hpf 0-2/hpf   RBC / HPF none seen 0-2/hpf   Squamous Epithelial / LPF Rare(0-4/hpf) Rare(0-4/hpf)  B-HCG Quant     Status: None   Collection Time: 07/17/17  2:17 PM  Result Value Ref Range   Quantitative HCG 14,655.00 mIU/ml    Comment: Non-Pregnant Females >66yr and <22yr=0.0-0.6(mIU/ml)Non-Pregnant Females >18yrs=0.0-3.1(mIU/ml)Non-Pregnant Females Post-Menopause0.1-11.6(mIU/ml)  CBC with Differential/Platelet     Status: None   Collection Time: 07/17/17  2:17 PM  Result Value Ref Range   WBC 8.9 4.0 - 10.5 K/uL   RBC 4.49 3.87 - 5.11 Mil/uL   Hemoglobin 14.4 12.0 - 15.0 g/dL   HCT 16.1 09.6 - 04.5 %   MCV 93.6 78.0 - 100.0 fl   MCHC 34.3 30.0 - 36.0 g/dL   RDW 40.9 81.1 - 91.4 %   Platelets 236.0 150.0 - 400.0 K/uL   Neutrophils Relative % 72.2 43.0 - 77.0 %   Lymphocytes Relative 16.9 12.0 - 46.0 %   Monocytes Relative 6.8 3.0 - 12.0 %   Eosinophils Relative 3.7 0.0 - 5.0 %   Basophils Relative 0.4 0.0 - 3.0 %   Neutro Abs 6.4 1.4 - 7.7 K/uL   Lymphs Abs 1.5 0.7 - 4.0 K/uL   Monocytes Absolute 0.6 0.1 - 1.0 K/uL   Eosinophils Absolute 0.3 0.0 - 0.7 K/uL   Basophils Absolute 0.0 0.0 - 0.1 K/uL  Comprehensive metabolic panel     Status: Abnormal   Collection Time: 07/17/17  2:17 PM  Result Value Ref Range   Sodium 135 135 - 145 mEq/L   Potassium 4.0 3.5 - 5.1 mEq/L   Chloride 101 96 - 112 mEq/L   CO2 28 19 - 32 mEq/L   Glucose, Bld 103 (H) 70 - 99 mg/dL   BUN 8 6 - 23 mg/dL   Creatinine, Ser 7.82 0.40 - 1.20 mg/dL   Total Bilirubin 0.5 0.2 - 1.2 mg/dL   Alkaline Phosphatase 48 39 - 117 U/L     AST 52 (H) 0 - 37 U/L   ALT 104 (H) 0 - 35 U/L   Total Protein 7.4 6.0 -  8.3 g/dL   Albumin 4.5 3.5 - 5.2 g/dL   Calcium 16.1 8.4 - 09.6 mg/dL   GFR 045.40 >98.11 mL/min  TSH     Status: None   Collection Time: 07/17/17  2:17 PM  Result Value Ref Range   TSH 2.14 0.35 - 4.50 uIU/mL  Hemoglobin A1c     Status: None   Collection Time: 07/17/17  2:17 PM  Result Value Ref Range   Hgb A1c MFr Bld 5.3 4.6 - 6.5 %    Comment: Glycemic Control Guidelines for People with Diabetes:Non Diabetic:  <6%Goal of Therapy: <7%Additional Action Suggested:  >8%   Lipid panel     Status: None   Collection Time: 07/17/17  2:17 PM  Result Value Ref Range   Cholesterol 159 0 - 200 mg/dL    Comment: ATP III Classification       Desirable:  < 200 mg/dL               Borderline High:  200 - 239 mg/dL          High:  > = 914 mg/dL   Triglycerides 78.2 0.0 - 149.0 mg/dL    Comment: Normal:  <956 mg/dLBorderline High:  150 - 199 mg/dL   HDL 21.30 >86.57 mg/dL   VLDL 8.4 0.0 - 84.6 mg/dL   LDL Cholesterol 85 0 - 99 mg/dL   Total CHOL/HDL Ratio 2     Comment:                Men          Women1/2 Average Risk     3.4          3.3Average Risk          5.0          4.42X Average Risk          9.6          7.13X Average Risk          15.0          11.0                       NonHDL 93.61     Comment: NOTE:  Non-HDL goal should be 30 mg/dL higher than patient's LDL goal (i.e. LDL goal of < 70 mg/dL, would have non-HDL goal of < 100 mg/dL)  Acute Hep Panel & Hep B Surface Ab     Status: None   Collection Time: 07/17/17  2:17 PM  Result Value Ref Range   Hep A IgM NON-REACTIVE NON-REACTI   Hepatitis B Surface Ag NON-REACTIVE NON-REACTI   Hep B C IgM NON-REACTIVE NON-REACTI   HEPATITIS C ANTIBODY REFILL$(REFL) NON-REACTIVE NON-REACTI   SIGNAL TO CUT-OFF 0.01 <1.00    Comment: . HCV antibody was non-reactive. There is no laboratory  evidence of HCV infection. . In most cases, no further action is required.  However, if recent HCV exposure is suspected, a test for HCV RNA (test code 96295) is suggested. . For additional information please refer to http://education.[facility].com/faq/FAQ22v1 (This link is being provided for informational/ educational purposes only.) .   HIV antibody (with reflex)     Status: None   Collection Time: 07/17/17  2:17 PM  Result Value Ref Range   HIV 1&2 Ab, 4th Generation NON-REACTIVE NON-REACTI    Comment: HIV-1 antigen and HIV-1/HIV-2 antibodies were not detected. There is no laboratory evidence of HIV infection. Marland Kitchen PLEASE NOTE:  This information has been disclosed to you from records whose confidentiality may be protected by state law.  If your state requires such protection, then the state law prohibits you from making any further disclosure of the information without the specific written consent of the person to whom it pertains, or as otherwise permitted by law. A general authorization for the release of medical or other information is NOT sufficient for this purpose. . For additional information please refer to http://education.[facility].com/faq/FAQ106 (This link is being provided for informational/ educational purposes only.) . Marland Kitchen. The performance of this assay has not been clinically validated in patients less than 28 years old. Marland Kitchen.   REFLEX TIQ     Status: None   Collection Time: 07/17/17  2:17 PM  Result Value Ref Range   REFLEX TIQ      Comment: OUR RECORDS INDICATE THAT YOU HAVE ORDERED ACUTE HEP PNL (REFL) ORDER CODE 32476XLL3.  THIS IS A REFLEX-SPECIFIC ORDER CODE. HOWEVER, ONLY THE INITIAL TEST WAS PERFORMED, BECAUSE WE DO NOT HAVE A REFLEX TESTING AUTHORIZATION FORM ON FILE FOR YOU.  TO  PERFORM A REFLEX TEST WE NEED YOU TO SIGN AN AUTHORIZATION FORM  SPECIFYING (A) THE REFLEXIVE TEST AND (B) THE RESULTS THAT WILL  TRIGGER THE PERFORMANCE OF THE REFLEX TEST.  PLEASE CONTACT THE  CLIENT SERVICE REPRESENTATIVE AT [facility] IF YOU WOULD  LIKE ADDITIONAL TESTING DONE OR CONTACT YOUR SALES REPRESENTATIVE  TO OBTAIN A COPY OF THE REFLEXIVE TESTING AUTHORIZATION FORM. . .   Hepatic function panel     Status: Abnormal   Collection Time: 08/03/17  9:21 AM  Result Value Ref Range   Total Bilirubin 0.5 0.2 - 1.2 mg/dL   Bilirubin, Direct 0.1 0.0 - 0.3 mg/dL   Alkaline Phosphatase 67 39 - 117 U/L   AST 45 (H) 0 - 37 U/L   ALT 114 (H) 0 - 35 U/L   Total Protein 6.8 6.0 - 8.3 g/dL   Albumin 4.3 3.5 - 5.2 g/dL  POCT urinalysis dipstick     Status: None   Collection Time: 08/23/17 11:49 AM  Result Value Ref Range   Color, UA     Clarity, UA     Glucose, UA neg    Bilirubin, UA     Ketones, UA neg    Spec Grav, UA  1.010 - 1.025   Blood, UA neg    pH, UA  5.0 - 8.0   Protein, UA trace    Urobilinogen, UA  0.2 or 1.0 E.U./dL   Nitrite, UA neg    Leukocytes, UA Negative Negative   Appearance     Odor    Urine Culture     Status: None   Collection Time: 08/23/17 12:00 PM  Result Value Ref Range   Urine Culture, Routine Final report    Organism ID, Bacteria No growth   Pain Management Screening Profile (10S)     Status: None   Collection Time: 08/23/17 12:00 PM  Result Value Ref Range   Amphetamine Scrn, Ur Negative Cutoff=1000 ng/mL   BARBITURATE SCREEN URINE Negative Cutoff=200 ng/mL   BENZODIAZEPINE SCREEN, URINE Negative Cutoff=200 ng/mL   CANNABINOIDS UR QL SCN Negative Cutoff=20 ng/mL   Cocaine (Metab) Scrn, Ur Negative Cutoff=300 ng/mL   Opiate Scrn, Ur Negative Cutoff=300 ng/mL    Comment: Opiate test includes Codeine, Morphine, Hydromorphone, Hydrocodone.   OXYCODONE+OXYMORPHONE UR QL SCN Negative Cutoff=100 ng/mL    Comment: Test includes Oxycodone and Oxymorphone   Phencyclidine  Qn, Ur Negative Cutoff=25 ng/mL   Methadone Screen, Urine Negative Cutoff=300 ng/mL   Propoxyphene Scrn, Ur Negative Cutoff=300 ng/mL   Creatinine(Crt), U 257.6 20.0 - 300.0 mg/dL   Ph of Urine 5.8 4.5  - 8.9   PLEASE NOTE: Comment     Comment: This assay provides a preliminary unconfirmed analytical test result that may be suitable for clinical management of patients in certain situations. Drug-test results should be interpreted in the context of clinical information. Patient metabolic variables, specific drug chemistry, and specimen characteristics can affect test outcome. Technical consultation is available if a test result is inconsistent with an expected outcome. ([email or web address] or call toll-free [phone])   Med List Option Not Selected     Status: None (Preliminary result)   Collection Time: 08/23/17 12:00 PM  Result Value Ref Range   Med List Option Not Selected WILL FOLLOW   Obstetric Panel, Including HIV     Status: Abnormal   Collection Time: 08/23/17 12:35 PM  Result Value Ref Range   Hepatitis B Surface Ag Negative Negative   RPR Ser Ql Non Reactive Non Reactive   Rubella Antibodies, IGG 1.28 Immune >0.99 index    Comment:                                 Non-immune       <0.90                                 Equivocal  0.90 - 0.99                                 Immune           >0.99    ABO Grouping A    Rh Factor Positive     Comment: Please note: Prior records for this patient's ABO / Rh type are not available for additional verification.    Antibody Screen Negative Negative   HIV Screen 4th Generation wRfx Non Reactive Non Reactive   WBC 9.8 3.4 - 10.8 x10E3/uL   RBC 4.29 3.77 - 5.28 x10E6/uL   Hemoglobin 13.4 11.1 - 15.9 g/dL   Hematocrit 09.8 11.9 - 46.6 %   MCV 93 79 - 97 fL   MCH 31.2 26.6 - 33.0 pg   MCHC 33.8 31.5 - 35.7 g/dL   RDW 14.7 82.9 - 56.2 %   Platelets 239 150 - 379 x10E3/uL   Neutrophils 78 Not Estab. %   Lymphs 12 Not Estab. %   Monocytes 6 Not Estab. %   Eos 4 Not Estab. %   Basos 0 Not Estab. %   Neutrophils Absolute 7.7 (H) 1.4 - 7.0 x10E3/uL   Lymphocytes Absolute 1.1 0.7 - 3.1 x10E3/uL   Monocytes Absolute  0.6 0.1 - 0.9 x10E3/uL   EOS (ABSOLUTE) 0.4 0.0 - 0.4 x10E3/uL   Basophils Absolute 0.0 0.0 - 0.2 x10E3/uL   Immature Granulocytes 0 Not Estab. %   Immature Grans (Abs) 0.0 0.0 - 0.1 x10E3/uL  Urinalysis, Routine w reflex microscopic     Status: Abnormal   Collection Time: 08/23/17 12:35 PM  Result Value Ref Range   Specific Gravity, UA 1.027 1.005 - 1.030   pH, UA 5.0 5.0 - 7.5   Color, UA Yellow Yellow   Appearance Ur  Cloudy (A) Clear   Leukocytes, UA Trace (A) Negative   Protein, UA Negative Negative/Trace   Glucose, UA Negative Negative   Ketones, UA Negative Negative   RBC, UA Negative Negative   Bilirubin, UA Negative Negative   Urobilinogen, Ur 0.2 0.2 - 1.0 mg/dL   Nitrite, UA Negative Negative   Microscopic Examination See below:     Comment: Microscopic was indicated and was performed.  Microscopic Examination     Status: None   Collection Time: 08/23/17 12:35 PM  Result Value Ref Range   WBC, UA 0-5 0 - 5 /hpf   RBC, UA 0-2 0 - 2 /hpf   Epithelial Cells (non renal) 0-10 0 - 10 /hpf   Casts None seen None seen /lpf   Mucus, UA Present Not Estab.   Bacteria, UA Few None seen/Few  POCT urinalysis dipstick     Status: None   Collection Time: 09/26/17 11:48 AM  Result Value Ref Range   Color, UA     Clarity, UA     Glucose, UA Negative Negative   Bilirubin, UA     Ketones, UA neg    Spec Grav, UA  1.010 - 1.025   Blood, UA neg    pH, UA  5.0 - 8.0   Protein, UA Negative Negative   Urobilinogen, UA  0.2 or 1.0 E.U./dL   Nitrite, UA neg    Leukocytes, UA Negative Negative   Appearance     Odor    AFP TETRA     Status: None   Collection Time: 09/26/17 12:22 PM  Result Value Ref Range   Results Report    Test Results: *Screen Negative*    Gestational Age 75.2 WEEKS   Gestat. Age Based On Ultrasound     Comment:                               15.2 on 09/26/2017   [age]D [age] yr   Race Caucasian    Weight 173 lbs   Insulin Dep Diabetes No     Multiple Gestation No    MSAFP 22.5 ng/mL   MSAFP Mom 0.85    MSHCG 57,288 mIU/mL   MSHCG Mom 1.12    uE3 Value 0.87 ng/mL   uE3 Mom 1.29    DIA Value (EIA) 200.68 pg/mL   DIA Mom Value 1.17    Osb Risk 10,000    DSR (Second Trimester) 1 IN 3,183    DSR (By Age)    1 IN 42    Tri 24 Risk At Term Not increased    T18 (By Age) 1:3281    MSAFP Interp Comment     Comment: Interpretation: Screen Negative This result is screen negative for fetal OSB, Down Syndrome and Trisomy 18. The AFP MoM and patient specific risks calculated are based on the gestational age and the clinical information provided. This test can identify up to 80% of open fetal neural tube defects. Closed neural tube defects and some open defects may not be detected by this test.  The combination of maternal age, AFP, hCG, uE3, and DIA identifies 75-80% of fetal Down Syndrome.  The combination of maternal age, AFP, hCG and uE3 identifies 60% of Trisomy 18 pregnancies.  The [employer] of [employer] recommends amniocentesis be offered to women age 26 and older. Recalculations are not recommended when gestational dating by LMP and ultrasound are  within 10 days.    Comments: Comment     Comment: Milus Mallick, Ph.D., [facility] Principal Genetics Technical Director References: Available Upon Request. Multiples Of Median Cutoffs     Abbreviation Definitions     For AFP Elevations          IDD- Insulin Dep Diabetes Singleton  2.5   Black  2.8     OSBR- Open Spina Bifida IDD        2.0   Twins  4.5            Risk DSR Cutoff 1:270                DSR- Down Syndrome Risk T18 Cutoff 1:100                T18- Trisomy 18 Down Syndrome and Trisomy 18 screening are considered Investigational For further inquiries contact [employer] at [phone].     Assessment/Plan: 1. Acute non-recurrent maxillary sinusitis Rx Amoxicillin.  Increase fluids.  Rest.  Saline nasal spray.   Probiotic. Humidifier in bedroom. Safe OTC medications reviewed.  Call or return to clinic if symptoms are not improving.  - amoxicillin (AMOXIL) 875 MG tablet; Take 1 tablet (875 mg total) by mouth 2 (two) times daily.  Dispense: 14 tablet; Refill: 0   Piedad Climes, PA-C

## 2017-10-02 NOTE — Patient Instructions (Signed)
Please take antibiotic as directed.  Increase fluid intake.  Use Saline nasal spray.  Take a daily multivitamin.  Place a humidifier in the bedroom.  Please call or return clinic if symptoms are not improving.  Please use the handout given to schedule an appointment with counseling as I feel this will be very beneficial.  If you note any worsening mood, please let me or Dr. Despina HiddenEure ASAP.  Sinusitis Sinusitis is redness, soreness, and swelling (inflammation) of the paranasal sinuses. Paranasal sinuses are air pockets within the bones of your face (beneath the eyes, the middle of the forehead, or above the eyes). In healthy paranasal sinuses, mucus is able to drain out, and air is able to circulate through them by way of your nose. However, when your paranasal sinuses are inflamed, mucus and air can become trapped. This can allow bacteria and other germs to grow and cause infection. Sinusitis can develop quickly and last only a short time (acute) or continue over a long period (chronic). Sinusitis that lasts for more than 12 weeks is considered chronic.  CAUSES  Causes of sinusitis include:  Allergies.  Structural abnormalities, such as displacement of the cartilage that separates your nostrils (deviated septum), which can decrease the air flow through your nose and sinuses and affect sinus drainage.  Functional abnormalities, such as when the small hairs (cilia) that line your sinuses and help remove mucus do not work properly or are not present. SYMPTOMS  Symptoms of acute and chronic sinusitis are the same. The primary symptoms are pain and pressure around the affected sinuses. Other symptoms include:  Upper toothache.  Earache.  Headache.  Bad breath.  Decreased sense of smell and taste.  A cough, which worsens when you are lying flat.  Fatigue.  Fever.  Thick drainage from your nose, which often is green and may contain pus (purulent).  Swelling and warmth over the affected  sinuses. DIAGNOSIS  Your caregiver will perform a physical exam. During the exam, your caregiver may:  Look in your nose for signs of abnormal growths in your nostrils (nasal polyps).  Tap over the affected sinus to check for signs of infection.  View the inside of your sinuses (endoscopy) with a special imaging device with a light attached (endoscope), which is inserted into your sinuses. If your caregiver suspects that you have chronic sinusitis, one or more of the following tests may be recommended:  Allergy tests.  Nasal culture A sample of mucus is taken from your nose and sent to a lab and screened for bacteria.  Nasal cytology A sample of mucus is taken from your nose and examined by your caregiver to determine if your sinusitis is related to an allergy. TREATMENT  Most cases of acute sinusitis are related to a viral infection and will resolve on their own within 10 days. Sometimes medicines are prescribed to help relieve symptoms (pain medicine, decongestants, nasal steroid sprays, or saline sprays).  However, for sinusitis related to a bacterial infection, your caregiver will prescribe antibiotic medicines. These are medicines that will help kill the bacteria causing the infection.  Rarely, sinusitis is caused by a fungal infection. In theses cases, your caregiver will prescribe antifungal medicine. For some cases of chronic sinusitis, surgery is needed. Generally, these are cases in which sinusitis recurs more than 3 times per year, despite other treatments. HOME CARE INSTRUCTIONS   Drink plenty of water. Water helps thin the mucus so your sinuses can drain more easily.  Use a humidifier.  Inhale steam 3 to 4 times a day (for example, sit in the bathroom with the shower running).  Apply a warm, moist washcloth to your face 3 to 4 times a day, or as directed by your caregiver.  Use saline nasal sprays to help moisten and clean your sinuses.  Take over-the-counter or  prescription medicines for pain, discomfort, or fever only as directed by your caregiver. SEEK IMMEDIATE MEDICAL CARE IF:  You have increasing pain or severe headaches.  You have nausea, vomiting, or drowsiness.  You have swelling around your face.  You have vision problems.  You have a stiff neck.  You have difficulty breathing. MAKE SURE YOU:   Understand these instructions.  Will watch your condition.  Will get help right away if you are not doing well or get worse. Document Released: 04/11/2005 Document Revised: 07/04/2011 Document Reviewed: 04/26/2011 [facility] Patient Information 2014 [place], [place].

## 2017-10-23 ENCOUNTER — Other Ambulatory Visit: Payer: Self-pay | Admitting: Obstetrics & Gynecology

## 2017-10-23 DIAGNOSIS — Z363 Encounter for antenatal screening for malformations: Secondary | ICD-10-CM

## 2017-10-23 DIAGNOSIS — Z3402 Encounter for supervision of normal first pregnancy, second trimester: Secondary | ICD-10-CM

## 2017-10-24 ENCOUNTER — Encounter: Payer: Self-pay | Admitting: Advanced Practice Midwife

## 2017-10-24 ENCOUNTER — Ambulatory Visit (INDEPENDENT_AMBULATORY_CARE_PROVIDER_SITE_OTHER): Payer: BLUE CROSS/BLUE SHIELD | Admitting: Advanced Practice Midwife

## 2017-10-24 ENCOUNTER — Other Ambulatory Visit: Payer: BLUE CROSS/BLUE SHIELD

## 2017-10-24 ENCOUNTER — Ambulatory Visit (INDEPENDENT_AMBULATORY_CARE_PROVIDER_SITE_OTHER): Payer: BLUE CROSS/BLUE SHIELD

## 2017-10-24 ENCOUNTER — Other Ambulatory Visit: Payer: Self-pay

## 2017-10-24 VITALS — BP 102/64 | HR 66 | Wt 176.0 lb

## 2017-10-24 DIAGNOSIS — Z3402 Encounter for supervision of normal first pregnancy, second trimester: Secondary | ICD-10-CM

## 2017-10-24 DIAGNOSIS — Z363 Encounter for antenatal screening for malformations: Secondary | ICD-10-CM

## 2017-10-24 DIAGNOSIS — Z3A19 19 weeks gestation of pregnancy: Secondary | ICD-10-CM

## 2017-10-24 DIAGNOSIS — Z331 Pregnant state, incidental: Secondary | ICD-10-CM

## 2017-10-24 DIAGNOSIS — Z1389 Encounter for screening for other disorder: Secondary | ICD-10-CM

## 2017-10-24 LAB — POCT URINALYSIS DIPSTICK
GLUCOSE UA: NEGATIVE
KETONES UA: NEGATIVE
LEUKOCYTES UA: NEGATIVE
NITRITE UA: NEGATIVE
Protein, UA: NEGATIVE
RBC UA: NEGATIVE

## 2017-10-24 NOTE — Progress Notes (Signed)
US 19+2 wks,cephalic,anterior pl gr 0,normal ovaries bilat,svp of fluid 4.7 cm,fhr 146 bpm,cx 3.4 cm,EFW 309 g 69%,anatomy complete,no obvious abnormalities

## 2017-10-24 NOTE — Patient Instructions (Signed)
Angela Moran, I greatly value your feedback.  If you receive a survey following your visit with us today, we appreciate you taking the time to fill it out.  Thanks, Angela Moran, CNM     Second Trimester of Pregnancy The second trimester is from week 14 through week 27 (months 4 through 6). The second trimester is often a time when you feel your best. Your body has adjusted to being pregnant, and you begin to feel better physically. Usually, morning sickness has lessened or quit completely, you may have more energy, and you may have an increase in appetite. The second trimester is also a time when the fetus is growing rapidly. At the end of the sixth month, the fetus is about 9 inches long and weighs about 1 pounds. You will likely begin to feel the baby move (quickening) between 16 and 20 weeks of pregnancy. Body changes during your second trimester Your body continues to go through many changes during your second trimester. The changes vary from woman to woman.  Your weight will continue to increase. You will notice your lower abdomen bulging out.  You may begin to get stretch marks on your hips, abdomen, and breasts.  You may develop headaches that can be relieved by medicines. The medicines should be approved by your health care provider.  You may urinate more often because the fetus is pressing on your bladder.  You may develop or continue to have heartburn as a result of your pregnancy.  You may develop constipation because certain hormones are causing the muscles that push waste through your intestines to slow down.  You may develop hemorrhoids or swollen, bulging veins (varicose veins).  You may have back pain. This is caused by: ? Weight gain. ? Pregnancy hormones that are relaxing the joints in your pelvis. ? A shift in weight and the muscles that support your balance.  Your breasts will continue to grow and they will continue to become tender.  Your gums may  bleed and may be sensitive to brushing and flossing.  Dark spots or blotches (chloasma, mask of pregnancy) may develop on your face. This will likely fade after the baby is born.  A dark line from your belly button to the pubic area (linea nigra) may appear. This will likely fade after the baby is born.  You may have changes in your hair. These can include thickening of your hair, rapid growth, and changes in texture. Some women also have hair loss during or after pregnancy, or hair that feels dry or thin. Your hair will most likely return to normal after your baby is born.  What to expect at prenatal visits During a routine prenatal visit:  You will be weighed to make sure you and the fetus are growing normally.  Your blood pressure will be taken.  Your abdomen will be measured to track your baby's growth.  The fetal heartbeat will be listened to.  Any test results from the previous visit will be discussed.  Your health care provider may ask you:  How you are feeling.  If you are feeling the baby move.  If you have had any abnormal symptoms, such as leaking fluid, bleeding, severe headaches, or abdominal cramping.  If you are using any tobacco products, including cigarettes, chewing tobacco, and electronic cigarettes.  If you have any questions.  Other tests that may be performed during your second trimester include:  Blood tests that check for: ? Low iron levels (anemia). ?  High blood sugar that affects pregnant women (gestational diabetes) between 20 and 28 weeks. ? Rh antibodies. This is to check for a protein on red blood cells (Rh factor).  Urine tests to check for infections, diabetes, or protein in the urine.  An ultrasound to confirm the proper growth and development of the baby.  An amniocentesis to check for possible genetic problems.  Fetal screens for spina bifida and Down syndrome.  HIV (human immunodeficiency virus) testing. Routine prenatal testing  includes screening for HIV, unless you choose not to have this test.  Follow these instructions at home: Medicines  Follow your health care provider's instructions regarding medicine use. Specific medicines may be either safe or unsafe to take during pregnancy.  Take a prenatal vitamin that contains at least 600 micrograms (mcg) of folic acid.  If you develop constipation, try taking a stool softener if your health care provider approves. Eating and drinking  Eat a balanced diet that includes fresh fruits and vegetables, whole grains, good sources of protein such as meat, eggs, or tofu, and low-fat dairy. Your health care provider will help you determine the amount of weight gain that is right for you.  Avoid raw meat and uncooked cheese. These carry germs that can cause birth defects in the baby.  If you have low calcium intake from food, talk to your health care provider about whether you should take a daily calcium supplement.  Limit foods that are high in fat and processed sugars, such as fried and sweet foods.  To prevent constipation: ? Drink enough fluid to keep your urine clear or pale yellow. ? Eat foods that are high in fiber, such as fresh fruits and vegetables, whole grains, and beans. Activity  Exercise only as directed by your health care provider. Most women can continue their usual exercise routine during pregnancy. Try to exercise for 30 minutes at least 5 days a week. Stop exercising if you experience uterine contractions.  Avoid heavy lifting, wear low heel shoes, and practice good posture.  A sexual relationship may be continued unless your health care provider directs you otherwise. Relieving pain and discomfort  Wear a good support bra to prevent discomfort from breast tenderness.  Take warm sitz baths to soothe any pain or discomfort caused by hemorrhoids. Use hemorrhoid cream if your health care provider approves.  Rest with your legs elevated if you have  leg cramps or low back pain.  If you develop varicose veins, wear support hose. Elevate your feet for 15 minutes, 3-4 times a day. Limit salt in your diet. Prenatal Care  Write down your questions. Take them to your prenatal visits.  Keep all your prenatal visits as told by your health care provider. This is important. Safety  Wear your seat belt at all times when driving.  Make a list of emergency phone numbers, including numbers for family, friends, the hospital, and police and fire departments. General instructions  Ask your health care provider for a referral to a local prenatal education class. Begin classes no later than the beginning of month 6 of your pregnancy.  Ask for help if you have counseling or nutritional needs during pregnancy. Your health care provider can offer advice or refer you to specialists for help with various needs.  Do not use hot tubs, steam rooms, or saunas.  Do not douche or use tampons or scented sanitary pads.  Do not cross your legs for long periods of time.  Avoid cat litter boxes and  soil used by cats. These carry germs that can cause birth defects in the baby and possibly loss of the fetus by miscarriage or stillbirth.  Avoid all smoking, herbs, alcohol, and unprescribed drugs. Chemicals in these products can affect the formation and growth of the baby.  Do not use any products that contain nicotine or tobacco, such as cigarettes and e-cigarettes. If you need help quitting, ask your health care provider.  Visit your dentist if you have not gone yet during your pregnancy. Use a soft toothbrush to brush your teeth and be gentle when you floss. Contact a health care provider if:  You have dizziness.  You have mild pelvic cramps, pelvic pressure, or nagging pain in the abdominal area.  You have persistent nausea, vomiting, or diarrhea.  You have a bad smelling vaginal discharge.  You have pain when you urinate. Get help right away if:  You  have a fever.  You are leaking fluid from your vagina.  You have spotting or bleeding from your vagina.  You have severe abdominal cramping or pain.  You have rapid weight gain or weight loss.  You have shortness of breath with chest pain.  You notice sudden or extreme swelling of your face, hands, ankles, feet, or legs.  You have not felt your baby move in over an hour.  You have severe headaches that do not go away when you take medicine.  You have vision changes. Summary  The second trimester is from week 14 through week 27 (months 4 through 6). It is also a time when the fetus is growing rapidly.  Your body goes through many changes during pregnancy. The changes vary from woman to woman.  Avoid all smoking, herbs, alcohol, and unprescribed drugs. These chemicals affect the formation and growth your baby.  Do not use any tobacco products, such as cigarettes, chewing tobacco, and e-cigarettes. If you need help quitting, ask your health care provider.  Contact your health care provider if you have any questions. Keep all prenatal visits as told by your health care provider. This is important. This information is not intended to replace advice given to you by your health care provider. Make sure you discuss any questions you have with your health care provider.      CHILDBIRTH CLASSES [phone] is the phone number for Pregnancy Classes or hospital tours at [place] will be referred to  [email or web address] for more information on childbirth classes  At this site you may register for classes. You may sign up for a waiting list if classes are full. Please SIGN UP FOR THIS!.   When the waiting list becomes long, sometimes new classes can be added.

## 2017-10-24 NOTE — Progress Notes (Signed)
  G1P0 3447w2d Estimated Date of Delivery: 03/18/18  Blood pressure 102/64, pulse 66, weight 176 lb (79.8 kg).   BP weight and urine results all reviewed and noted.  Please refer to the obstetrical flow sheet for the fundal height and fetal heart rate documentation:  Angela Moran reports good fetal movement, denies any bleeding and no rupture of membranes symptoms or regular contractions. Angela Moran is without complaints. All questions were answered.   Physical Assessment:   Vitals:   10/24/17 1055  BP: 102/64  Pulse: 66  Weight: 176 lb (79.8 kg)  Body mass index is 29.29 kg/m.        Physical Examination:   General appearance: Well appearing, and in no distress  Mental status: Alert, oriented to person, place, and time  Skin: Warm & dry  Cardiovascular: Normal heart rate noted  Respiratory: Normal respiratory effort, no distress  Abdomen: Soft, gravid, nontender  Pelvic: Cervical exam deferred         Extremities: Edema: Trace  Fetal Status:     Movement: Absent    US 19+2 wks,cephalic,anterior pl gr 0,normal ovaries bilat,svp of fluid 4.7 cm,fhr 146 bpm,cx 3.4 cm,EFW 309 g 69%,anatomy complete,no obvious abnormalities     Results for orders placed or performed in visit on 10/24/17 (from the past 24 hour(s))  POCT urinalysis dipstick   Collection Time: 10/24/17 10:57 AM  Result Value Ref Range   Color, UA     Clarity, UA     Glucose, UA Negative Negative   Bilirubin, UA     Ketones, UA neg    Spec Grav, UA  1.010 - 1.025   Blood, UA neg    pH, UA  5.0 - 8.0   Protein, UA Negative Negative   Urobilinogen, UA  0.2 or 1.0 E.U./dL   Nitrite, UA neg    Leukocytes, UA Negative Negative   Appearance     Odor       Orders Placed This Encounter  Procedures  . POCT urinalysis dipstick    Plan:  Continued routine obstetrical care,    Return in about 1 month (around 11/21/2017) for LROB.

## 2017-11-10 ENCOUNTER — Inpatient Hospital Stay (HOSPITAL_COMMUNITY)
Admission: AD | Admit: 2017-11-10 | Discharge: 2017-11-10 | Discharge status: Against Medical Advice | Payer: BLUE CROSS/BLUE SHIELD | Source: Ambulatory Visit | Attending: Obstetrics and Gynecology | Admitting: Obstetrics and Gynecology

## 2017-11-10 DIAGNOSIS — Z3402 Encounter for supervision of normal first pregnancy, second trimester: Secondary | ICD-10-CM

## 2017-11-10 DIAGNOSIS — Z5321 Procedure and treatment not carried out due to patient leaving prior to being seen by health care provider: Secondary | ICD-10-CM | POA: Insufficient documentation

## 2017-11-10 DIAGNOSIS — R109 Unspecified abdominal pain: Secondary | ICD-10-CM | POA: Diagnosis not present

## 2017-11-10 LAB — URINALYSIS, ROUTINE W REFLEX MICROSCOPIC
Bilirubin Urine: NEGATIVE
GLUCOSE, UA: NEGATIVE mg/dL
HGB URINE DIPSTICK: NEGATIVE
KETONES UR: NEGATIVE mg/dL
LEUKOCYTES UA: NEGATIVE
Nitrite: NEGATIVE
PH: 7 (ref 5.0–8.0)
PROTEIN: NEGATIVE mg/dL
Specific Gravity, Urine: 1.008 (ref 1.005–1.030)

## 2017-11-10 NOTE — MAU Note (Signed)
Pt called and not in lobby 

## 2017-11-10 NOTE — MAU Note (Signed)
Pt reports she sat up in bed last week and had a "pulling" sensation around her belly button. Has had sharp burning pain in that area ever since.

## 2017-11-10 NOTE — MAU Note (Signed)
Pt not in lobby.  

## 2017-11-21 ENCOUNTER — Encounter: Payer: BLUE CROSS/BLUE SHIELD | Admitting: Obstetrics & Gynecology

## 2017-11-22 ENCOUNTER — Ambulatory Visit (INDEPENDENT_AMBULATORY_CARE_PROVIDER_SITE_OTHER): Payer: BLUE CROSS/BLUE SHIELD | Admitting: Advanced Practice Midwife

## 2017-11-22 ENCOUNTER — Encounter: Payer: Self-pay | Admitting: Advanced Practice Midwife

## 2017-11-22 ENCOUNTER — Other Ambulatory Visit: Payer: Self-pay

## 2017-11-22 VITALS — BP 104/68 | HR 74 | Wt 177.0 lb

## 2017-11-22 DIAGNOSIS — Z331 Pregnant state, incidental: Secondary | ICD-10-CM

## 2017-11-22 DIAGNOSIS — O99332 Smoking (tobacco) complicating pregnancy, second trimester: Secondary | ICD-10-CM | POA: Diagnosis not present

## 2017-11-22 DIAGNOSIS — F17210 Nicotine dependence, cigarettes, uncomplicated: Secondary | ICD-10-CM | POA: Diagnosis not present

## 2017-11-22 DIAGNOSIS — Z3402 Encounter for supervision of normal first pregnancy, second trimester: Secondary | ICD-10-CM

## 2017-11-22 DIAGNOSIS — Z3A23 23 weeks gestation of pregnancy: Secondary | ICD-10-CM

## 2017-11-22 DIAGNOSIS — Z1389 Encounter for screening for other disorder: Secondary | ICD-10-CM

## 2017-11-22 LAB — POCT URINALYSIS DIPSTICK OB
Ketones, UA: NEGATIVE
LEUKOCYTES UA: NEGATIVE
Nitrite, UA: NEGATIVE
POC,PROTEIN,UA: NEGATIVE
RBC UA: NEGATIVE

## 2017-11-22 NOTE — Progress Notes (Signed)
  G1P0 6839w3d Estimated Date of Delivery: 03/18/18  Blood pressure 104/68, pulse 74, weight 177 lb (80.3 kg).   BP weight and urine results all reviewed and noted.  Please refer to the obstetrical flow sheet for the fundal height and fetal heart rate documentation:  Patient reports good fetal movement, denies any bleeding and no rupture of membranes symptoms or regular contractions. Patient is without complaints. Looked up fetal ETOH syndrome, and now doesn't plan to drink anymore during pregnancy.  Once again discussed smoking, cessation strategies, risks during pregnancy. May give e cigs a try  10 minute discussion  All questions were answered.   Physical Assessment:   Vitals:   11/22/17 1032  BP: 104/68  Pulse: 74  Weight: 177 lb (80.3 kg)  Body mass index is 27.72 kg/m.        Physical Examination:   General appearance: Well appearing, and in no distress  Mental status: Alert, oriented to person, place, and time  Skin: Warm & dry  Cardiovascular: Normal heart rate noted  Respiratory: Normal respiratory effort, no distress  Abdomen: Soft, gravid, nontender  Pelvic: Cervical exam deferred         Extremities: Edema: Trace  Fetal Status:     Movement: Present    Results for orders placed or performed in visit on 11/22/17 (from the past 24 hour(s))  POC Urinalysis Dipstick OB   Collection Time: 11/22/17 10:37 AM  Result Value Ref Range   Color, UA     Clarity, UA     Glucose, UA Trace (A) (none)   Bilirubin, UA     Ketones, UA neg    Spec Grav, UA  1.010 - 1.025   Blood, UA neg    pH, UA  5.0 - 8.0   POC Protein UA Negative Negative, Trace   Urobilinogen, UA  0.2 or 1.0 E.U./dL   Nitrite, UA neg    Leukocytes, UA Negative Negative   Appearance     Odor       Orders Placed This Encounter  Procedures  . POC Urinalysis Dipstick OB    Plan:  Continued routine obstetrical care,   Return in about 1 month (around 12/20/2017) for PN2/LROB.

## 2017-11-22 NOTE — Patient Instructions (Signed)

## 2017-12-18 ENCOUNTER — Other Ambulatory Visit: Payer: BLUE CROSS/BLUE SHIELD

## 2017-12-18 ENCOUNTER — Encounter: Payer: Self-pay | Admitting: Women's Health

## 2017-12-18 ENCOUNTER — Ambulatory Visit (INDEPENDENT_AMBULATORY_CARE_PROVIDER_SITE_OTHER): Payer: BLUE CROSS/BLUE SHIELD | Admitting: Women's Health

## 2017-12-18 VITALS — BP 110/71 | HR 71 | Wt 181.0 lb

## 2017-12-18 DIAGNOSIS — B009 Herpesviral infection, unspecified: Secondary | ICD-10-CM

## 2017-12-18 DIAGNOSIS — Z1389 Encounter for screening for other disorder: Secondary | ICD-10-CM

## 2017-12-18 DIAGNOSIS — Z3402 Encounter for supervision of normal first pregnancy, second trimester: Secondary | ICD-10-CM

## 2017-12-18 DIAGNOSIS — Z23 Encounter for immunization: Secondary | ICD-10-CM | POA: Diagnosis not present

## 2017-12-18 DIAGNOSIS — F17200 Nicotine dependence, unspecified, uncomplicated: Secondary | ICD-10-CM | POA: Insufficient documentation

## 2017-12-18 DIAGNOSIS — Z331 Pregnant state, incidental: Secondary | ICD-10-CM

## 2017-12-18 DIAGNOSIS — O99322 Drug use complicating pregnancy, second trimester: Secondary | ICD-10-CM

## 2017-12-18 DIAGNOSIS — Z029 Encounter for administrative examinations, unspecified: Secondary | ICD-10-CM

## 2017-12-18 DIAGNOSIS — Z131 Encounter for screening for diabetes mellitus: Secondary | ICD-10-CM | POA: Diagnosis not present

## 2017-12-18 DIAGNOSIS — Z3A27 27 weeks gestation of pregnancy: Secondary | ICD-10-CM

## 2017-12-18 DIAGNOSIS — Z87898 Personal history of other specified conditions: Secondary | ICD-10-CM | POA: Diagnosis not present

## 2017-12-18 DIAGNOSIS — R945 Abnormal results of liver function studies: Secondary | ICD-10-CM | POA: Diagnosis not present

## 2017-12-18 DIAGNOSIS — Z113 Encounter for screening for infections with a predominantly sexual mode of transmission: Secondary | ICD-10-CM | POA: Diagnosis not present

## 2017-12-18 DIAGNOSIS — O288 Other abnormal findings on antenatal screening of mother: Secondary | ICD-10-CM

## 2017-12-18 DIAGNOSIS — O99310 Alcohol use complicating pregnancy, unspecified trimester: Secondary | ICD-10-CM

## 2017-12-18 DIAGNOSIS — O99330 Smoking (tobacco) complicating pregnancy, unspecified trimester: Secondary | ICD-10-CM

## 2017-12-18 DIAGNOSIS — R7989 Other specified abnormal findings of blood chemistry: Secondary | ICD-10-CM

## 2017-12-18 DIAGNOSIS — F1991 Other psychoactive substance use, unspecified, in remission: Secondary | ICD-10-CM

## 2017-12-18 LAB — POCT URINALYSIS DIPSTICK OB
Glucose, UA: NEGATIVE — AB
Ketones, UA: NEGATIVE
Leukocytes, UA: NEGATIVE
NITRITE UA: NEGATIVE
PROTEIN: NEGATIVE
RBC UA: NEGATIVE

## 2017-12-18 NOTE — Progress Notes (Signed)
LOW-RISK PREGNANCY VISIT Patient name: Angela Moran MRN [id number]  Date of birth: 02/18/1991 Chief Complaint:   Routine Prenatal Visit  History of Present Illness:   Angela Moran is a 27 y.o. G1P0 female at 86w1dwith an Estimated Date of Delivery: 03/18/18 being seen today for ongoing management of a low-risk pregnancy.  Today she reports drinking etoh still once weekly, last night had 1.5 glasses of wine, last week was beer.  Did have 3 shots of liquor prior to last visit-states she looked up FAS and decided not to drink liquor anymore. Drank 'a lot' prior to pregnancy. Smoking 6-8 cigarettes/day, switched to Juuls but read about lung damage, wants to try patch. Denies any illicit drug use in 2-3 months (h/o heroin/cocaine). Cramping at work lifting boxes, requests note . Had elevated LFTs in March.  . Vag. Bleeding: None.  Movement: Present. denies leaking of fluid. Review of Systems:   Pertinent items are noted in HPI Denies abnormal vaginal discharge w/ itching/odor/irritation, headaches, visual changes, shortness of breath, chest pain, abdominal pain, severe nausea/vomiting, or problems with urination or bowel movements unless otherwise stated above. Pertinent History Reviewed:  Reviewed past medical,surgical, social, obstetrical and family history.  Reviewed problem list, medications and allergies. Physical Assessment:   Vitals:   12/18/17 0857  BP: 110/71  Pulse: 71  Weight: 181 lb (82.1 kg)  Body mass index is 28.35 kg/m.        Physical Examination:   General appearance: Well appearing, and in no distress  Mental status: Alert, oriented to person, place, and time  Skin: Warm & dry  Cardiovascular: Normal heart rate noted  Respiratory: Normal respiratory effort, no distress  Abdomen: Soft, gravid, nontender  Pelvic: Cervical exam deferred         Extremities: Edema: Trace  Fetal Status: Fetal Heart Rate (bpm): 145 Fundal Height: 27 cm Movement: Present     Results for orders placed or performed in visit on 12/18/17 (from the past 24 hour(s))  POC Urinalysis Dipstick OB   Collection Time: 12/18/17  9:02 AM  Result Value Ref Range   Color, UA     Clarity, UA     Glucose, UA Negative (A) (none)   Bilirubin, UA     Ketones, UA neg    Spec Grav, UA     Blood, UA neg    pH, UA     POC Protein UA Negative Negative, Trace   Urobilinogen, UA     Nitrite, UA neg    Leukocytes, UA Negative Negative   Appearance     Odor      Assessment & Plan:  1) Low-risk pregnancy G1P0 at 226w1dith an Estimated Date of Delivery: 03/18/18   2) 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 Advised complete cessation, no known safe amount of ETOH during pregnancy. Discussed FAS. Declines AA. [place]/ [place]  3) Smoker> 6-8 cigarettes/day, wants to quit, declines Quitline [place], wants to try patches- start w/ 75m30maily, do not smoke w/ patches on.   4) H/O elevated LFTs> will recheck today  5) H/O heroin/cocaine use> none in 2-3mt675m UDS today   Meds: No orders of the defined types were placed in this encounter.  Labs/procedures today: pn2, tdap  Plan:  Continue routine obstetrical care   Reviewed: Preterm labor symptoms and general obstetric precautions including but not limited to vaginal bleeding, contractions, leaking of fluid and fetal movement were reviewed in detail with the patient.  All  questions were answered  Follow-up: Return in about 3 weeks (around 01/08/2018) for [place].  Orders Placed This Encounter  Procedures  . GC/Chlamydia Probe Amp  . Tdap vaccine greater than or equal to 7yo IM  . Pain Management Screening Profile (10S)  . Comprehensive metabolic panel  . POC Urinalysis Dipstick OB   Roma Schanz CNM, [facility] 12/18/2017 9:40 AM

## 2017-12-18 NOTE — Patient Instructions (Addendum)
Angela Moran, I greatly value your feedback.  If you receive a survey following your visit with [place] today, we appreciate you taking the time to fill it out.  Thanks, Joellyn Haff, CNM, WHNP-BC  Try 7mg  nicotine patches   Call the office [phone]) or go to [facility] if:  You begin to have strong, frequent contractions  Your water breaks.  Sometimes it is a big gush of fluid, sometimes it is just a trickle that keeps getting your panties wet or running down your legs  You have vaginal bleeding.  It is normal to have a small amount of spotting if your cervix was checked.   You don't feel your baby moving like normal.  If you don't, get you something to eat and drink and lay down and focus on feeling your baby move.  You should feel at least 10 movements in 2 hours.  If you don't, you should call the office or go to [facility].    Tdap Vaccine  It is recommended that you get the Tdap vaccine during the third trimester of EACH pregnancy to help protect your baby from getting pertussis (whooping cough)  27-36 weeks is the BEST time to do this so that you can pass the protection on to your baby. During pregnancy is better than after pregnancy, but if you are unable to get it during pregnancy it will be offered at the hospital.   You can get this vaccine with [place], at the health department, your family doctor, or some local pharmacies  Everyone who will be around your baby should also be up-to-date on their vaccines before the baby comes. Adults (who are not pregnant) only need 1 dose of Tdap during adulthood.   Third Trimester of Pregnancy The third trimester is from week 29 through week 42, months 7 through 9. The third trimester is a time when the fetus is growing rapidly. At the end of the ninth month, the fetus is about 20 inches in length and weighs 6-10 pounds.  BODY CHANGES Your body goes through many changes during pregnancy. The changes vary from woman to woman.   Your  weight will continue to increase. You can expect to gain 25-35 pounds (11-16 kg) by the end of the pregnancy.  You may begin to get stretch marks on your hips, abdomen, and breasts.  You may urinate more often because the fetus is moving lower into your pelvis and pressing on your bladder.  You may develop or continue to have heartburn as a result of your pregnancy.  You may develop constipation because certain hormones are causing the muscles that push waste through your intestines to slow down.  You may develop hemorrhoids or swollen, bulging veins (varicose veins).  You may have pelvic pain because of the weight gain and pregnancy hormones relaxing your joints between the bones in your pelvis. Backaches may result from overexertion of the muscles supporting your posture.  You may have changes in your hair. These can include thickening of your hair, rapid growth, and changes in texture. Some women also have hair loss during or after pregnancy, or hair that feels dry or thin. Your hair will most likely return to normal after your baby is born.  Your breasts will continue to grow and be tender. A yellow discharge may leak from your breasts called colostrum.  Your belly button may stick out.  You may feel short of breath because of your expanding uterus.  You may notice the  fetus "dropping," or moving lower in your abdomen.  You may have a bloody mucus discharge. This usually occurs a few days to a week before labor begins.  Your cervix becomes thin and soft (effaced) near your due date. WHAT TO EXPECT AT YOUR PRENATAL EXAMS  You will have prenatal exams every 2 weeks until week 36. Then, you will have weekly prenatal exams. During a routine prenatal visit:  You will be weighed to make sure you and the fetus are growing normally.  Your blood pressure is taken.  Your abdomen will be measured to track your baby's growth.  The fetal heartbeat will be listened to.  Any test results  from the previous visit will be discussed.  You may have a cervical check near your due date to see if you have effaced. At around 36 weeks, your caregiver will check your cervix. At the same time, your caregiver will also perform a test on the secretions of the vaginal tissue. This test is to determine if a type of bacteria, Group B streptococcus, is present. Your caregiver will explain this further. Your caregiver may ask you:  What your birth plan is.  How you are feeling.  If you are feeling the baby move.  If you have had any abnormal symptoms, such as leaking fluid, bleeding, severe headaches, or abdominal cramping.  If you have any questions. Other tests or screenings that may be performed during your third trimester include:  Blood tests that check for low iron levels (anemia).  Fetal testing to check the health, activity level, and growth of the fetus. Testing is done if you have certain medical conditions or if there are problems during the pregnancy. FALSE LABOR You may feel small, irregular contractions that eventually go away. These are called Braxton Hicks contractions, or false labor. Contractions may last for hours, days, or even weeks before true labor sets in. If contractions come at regular intervals, intensify, or become painful, it is best to be seen by your caregiver.  SIGNS OF LABOR   Menstrual-like cramps.  Contractions that are 5 minutes apart or less.  Contractions that start on the top of the uterus and spread down to the lower abdomen and back.  A sense of increased pelvic pressure or back pain.  A watery or bloody mucus discharge that comes from the vagina. If you have any of these signs before the 37th week of pregnancy, call your caregiver right away. You need to go to the hospital to get checked immediately. HOME CARE INSTRUCTIONS   Avoid all smoking, herbs, alcohol, and unprescribed drugs. These chemicals affect the formation and growth of the  baby.  Follow your caregiver's instructions regarding medicine use. There are medicines that are either safe or unsafe to take during pregnancy.  Exercise only as directed by your caregiver. Experiencing uterine cramps is a good sign to stop exercising.  Continue to eat regular, healthy meals.  Wear a good support bra for breast tenderness.  Do not use hot tubs, steam rooms, or saunas.  Wear your seat belt at all times when driving.  Avoid raw meat, uncooked cheese, cat litter boxes, and soil used by cats. These carry germs that can cause birth defects in the baby.  Take your prenatal vitamins.  Try taking a stool softener (if your caregiver approves) if you develop constipation. Eat more high-fiber foods, such as fresh vegetables or fruit and whole grains. Drink plenty of fluids to keep your urine clear or pale yellow.  Take warm sitz baths to soothe any pain or discomfort caused by hemorrhoids. Use hemorrhoid cream if your caregiver approves.  If you develop varicose veins, wear support hose. Elevate your feet for 15 minutes, 3-4 times a day. Limit salt in your diet.  Avoid heavy lifting, wear low heal shoes, and practice good posture.  Rest a lot with your legs elevated if you have leg cramps or low back pain.  Visit your dentist if you have not gone during your pregnancy. Use a soft toothbrush to brush your teeth and be gentle when you floss.  A sexual relationship may be continued unless your caregiver directs you otherwise.  Do not travel far distances unless it is absolutely necessary and only with the approval of your caregiver.  Take prenatal classes to understand, practice, and ask questions about the labor and delivery.  Make a trial run to the hospital.  Pack your hospital bag.  Prepare the baby's nursery.  Continue to go to all your prenatal visits as directed by your caregiver. SEEK MEDICAL CARE IF:  You are unsure if you are in labor or if your water has  broken.  You have dizziness.  You have mild pelvic cramps, pelvic pressure, or nagging pain in your abdominal area.  You have persistent nausea, vomiting, or diarrhea.  You have a bad smelling vaginal discharge.  You have pain with urination. SEEK IMMEDIATE MEDICAL CARE IF:   You have a fever.  You are leaking fluid from your vagina.  You have spotting or bleeding from your vagina.  You have severe abdominal cramping or pain.  You have rapid weight loss or gain.  You have shortness of breath with chest pain.  You notice sudden or extreme swelling of your face, hands, ankles, feet, or legs.  You have not felt your baby move in over an hour.  You have severe headaches that do not go away with medicine.  You have vision changes. Document Released: 04/05/2001 Document Revised: 04/16/2013 Document Reviewed: 06/12/2012 [facility] Patient Information 2015 [place], [place]. This information is not intended to replace advice given to you by your health care provider. Make sure you discuss any questions you have with your health care provider.   Smoking During Pregnancy Smoking during pregnancy is unhealthy for you and your baby. Smoke from cigarettes, pipes, and cigars contains many chemicals that can cause cancer (carcinogens). Cigarettes also contain a stimulant drug (nicotine). When you smoke, harmful substances that you breathe in enter your bloodstream and can be passed on to your baby. This can affect your baby's development. If you are planning to become pregnant or have recently become pregnant, talk with your health care provider about quitting smoking. How does smoking affect me? Smoking increases your risk for many long-term (chronic) diseases. These diseases include cancer, lung diseases, and heart disease. Smoking during pregnancy increases your risk of:  Losing the pregnancy (miscarriage or stillbirth).  Giving birth too early (premature birth).  Pregnancy outside of  the uterus (tubal pregnancy).  Having problems with the organ that provides the baby nourishment and oxygen (placenta), including: ? Attachment of the placenta over the opening of the uterus (placenta previa). ? Detachment of the placenta before the baby's birth (placental abruption).  Having your water break before labor begins (premature rupture of membranes).  How does smoking affect my baby? Before Birth Smoking during pregnancy:  Decreases blood flow and oxygen to your baby.  Increases your baby's risk of birth defects, such as heart defects.  Increases your baby's heart rate.  Slows your baby's growth in the uterus (intrauterine growth retardation).  After Birth Babies born to women who smoked during pregnancy may:  Have symptoms of nicotine withdrawal.  Need to stay in the hospital for special care.  May be too small at birth.  Have a high risk of: ? Serious health problems or lifelong disabilities. ? Sudden infant death syndrome (SIDS). ? Becoming obese. ? Developing behavior or learning problems.  What can happen if changes are not made? When babies are born with a birth defect or illness, they often need to stay in the hospital longer before going home. Hospital stays may also be longer if you had any complications during labor or delivery. Longer hospital stays and more treatments result in higher costs for health care. Many health issues among babies born to mothers who smoke can have a lifelong impact. This may include the long-term need for certain medicines, therapies, or other treatments. What are the benefits of not smoking during pregnancy? You have a much better chance of having a healthy pregnancy and a healthy baby if you do not smoke while you are pregnant. Not smoking also means that you will have a better chance of living a long and healthy life, and your baby will have a better chance of growing into a healthy child and adult. What actions can be  taken? Quitting smoking can be difficult. Ask your health care provider for help to stop smoking. You may also consider:  Counseling to help you quit smoking (smoking cessation counseling).  Psychotherapy.  Acupuncture.  Hypnosis.  Telephone [employer].  If these methods do not help you, talk with your health care provider about other options. Do not take smoking cessation medicines or nicotine supplements unless your health care provider tells you to. Where to find more information: Learn more about smoking during pregnancy and quitting smoking from:  March of Dimes: www.marchofdimes.org/pregnancy/smoking-during-pregnancy.aspx  U.S. Department of Health and Human Services: women.smokefree.gov  American Cancer Society: www.cancer.org  American Heart Association: www.heart.org  National Cancer Institute: www.cancer.gov  For help to quit smoking:  National smoking cessation telephone hotline: 1-800-QUIT NOW (773) 659-9837)  Contact a health care provider if:  You are struggling to quit smoking.  You are a smoker and you become pregnant or plan to become pregnant.  You start smoking again after giving birth. Summary  Tobacco smoke contains harmful substances that can affect a baby's health and development.  Smoking increases the risk for serious problems, such as miscarriage, birth defects, or premature birth.  If you need help to quit smoking, ask your health care provider. This information is not intended to replace advice given to you by your health care provider. Make sure you discuss any questions you have with your health care provider. Document Released: 08/23/2004 Document Revised: 01/29/2016 Document Reviewed: 01/29/2016 Elsevier Interactive Patient Education  2018 [employer].

## 2017-12-19 LAB — PMP SCREEN PROFILE (10S), URINE
AMPHETAMINE SCREEN URINE: NEGATIVE ng/mL
BARBITURATE SCREEN URINE: NEGATIVE ng/mL
BENZODIAZEPINE SCREEN, URINE: NEGATIVE ng/mL
CANNABINOIDS UR QL SCN: NEGATIVE ng/mL
CREATININE(CRT), U: 69.9 mg/dL (ref 20.0–300.0)
Cocaine (Metab) Scrn, Ur: NEGATIVE ng/mL
METHADONE SCREEN, URINE: NEGATIVE ng/mL
OXYCODONE+OXYMORPHONE UR QL SCN: NEGATIVE ng/mL
Opiate Scrn, Ur: NEGATIVE ng/mL
PH UR, DRUG SCRN: 7.3 (ref 4.5–8.9)
PHENCYCLIDINE QUANTITATIVE URINE: NEGATIVE ng/mL
PROPOXYPHENE SCREEN URINE: NEGATIVE ng/mL

## 2017-12-19 LAB — CBC
Hematocrit: 37.1 % (ref 34.0–46.6)
Hemoglobin: 12.5 g/dL (ref 11.1–15.9)
MCH: 31.1 pg (ref 26.6–33.0)
MCHC: 33.7 g/dL (ref 31.5–35.7)
MCV: 92 fL (ref 79–97)
Platelets: 188 10*3/uL (ref 150–450)
RBC: 4.02 x10E6/uL (ref 3.77–5.28)
RDW: 12.2 % — ABNORMAL LOW (ref 12.3–15.4)
WBC: 10.6 10*3/uL (ref 3.4–10.8)

## 2017-12-19 LAB — COMPREHENSIVE METABOLIC PANEL
ALK PHOS: 55 IU/L (ref 39–117)
ALT: 13 IU/L (ref 0–32)
AST: 13 IU/L (ref 0–40)
Albumin/Globulin Ratio: 1.5 (ref 1.2–2.2)
Albumin: 3.7 g/dL (ref 3.5–5.5)
BUN/Creatinine Ratio: 14 (ref 9–23)
BUN: 8 mg/dL (ref 6–20)
Bilirubin Total: <0.2 mg/dL (ref 0.0–1.2)
CO2: 20 mmol/L (ref 20–29)
Calcium: 9 mg/dL (ref 8.7–10.2)
Chloride: 105 mmol/L (ref 96–106)
Creatinine, Ser: 0.57 mg/dL (ref 0.57–1.00)
GFR calc Af Amer: 147 mL/min/{1.73_m2} (ref >59)
GFR calc non Af Amer: 127 mL/min/{1.73_m2} (ref >59)
GLOBULIN, TOTAL: 2.5 g/dL (ref 1.5–4.5)
GLUCOSE: 110 mg/dL — AB (ref 65–99)
POTASSIUM: 4.4 mmol/L (ref 3.5–5.2)
Sodium: 139 mmol/L (ref 134–144)
Total Protein: 6.2 g/dL (ref 6.0–8.5)

## 2017-12-19 LAB — RPR: RPR: NONREACTIVE

## 2017-12-19 LAB — GLUCOSE TOLERANCE, 2 HOURS W/ 1HR
GLUCOSE, 1 HOUR: 104 mg/dL (ref 65–179)
GLUCOSE, 2 HOUR: 122 mg/dL (ref 65–152)
Glucose, Fasting: 86 mg/dL (ref 65–91)

## 2017-12-19 LAB — ANTIBODY SCREEN: Antibody Screen: NEGATIVE

## 2017-12-19 LAB — HIV ANTIBODY (ROUTINE TESTING W REFLEX): HIV Screen 4th Generation wRfx: NONREACTIVE

## 2017-12-20 LAB — GC/CHLAMYDIA PROBE AMP
Chlamydia trachomatis, NAA: NEGATIVE
Neisseria gonorrhoeae by PCR: NEGATIVE

## 2017-12-22 DIAGNOSIS — J018 Other acute sinusitis: Secondary | ICD-10-CM | POA: Diagnosis not present

## 2017-12-24 DIAGNOSIS — J069 Acute upper respiratory infection, unspecified: Secondary | ICD-10-CM | POA: Diagnosis not present

## 2017-12-24 DIAGNOSIS — J029 Acute pharyngitis, unspecified: Secondary | ICD-10-CM | POA: Diagnosis not present

## 2017-12-24 DIAGNOSIS — R05 Cough: Secondary | ICD-10-CM | POA: Diagnosis not present

## 2017-12-28 ENCOUNTER — Other Ambulatory Visit: Payer: Self-pay

## 2017-12-28 ENCOUNTER — Encounter: Payer: Self-pay | Admitting: Physician Assistant

## 2017-12-28 ENCOUNTER — Ambulatory Visit: Payer: BLUE CROSS/BLUE SHIELD | Admitting: Physician Assistant

## 2017-12-28 VITALS — BP 98/60 | HR 102 | Temp 98.2°F | Resp 14 | Ht 65.0 in | Wt 180.0 lb

## 2017-12-28 DIAGNOSIS — J208 Acute bronchitis due to other specified organisms: Secondary | ICD-10-CM | POA: Diagnosis not present

## 2017-12-28 DIAGNOSIS — B9689 Other specified bacterial agents as the cause of diseases classified elsewhere: Secondary | ICD-10-CM | POA: Diagnosis not present

## 2017-12-28 MED ORDER — AMOXICILLIN 875 MG PO TABS: 875.0000 mg | ORAL_TABLET | Freq: Two times a day (BID) | ORAL | 0 refills | Status: DC

## 2017-12-28 NOTE — Progress Notes (Signed)
Patient presents to clinic today c/o 7 days of progressively worsening chest congestion and cough that was dry but is now productive of thick yellow-green sputum. Notes nasal congestion and PND but chest congestion is much more significant. Denies fever, chills, aches. Patient is pregnant, currently in her second trimester.   Past Medical History:  Diagnosis Date  . Depression   . HSV-2 infection   . Vaginal Pap smear, abnormal     Current Outpatient Medications on File Prior to Visit  Medication Sig Dispense Refill  . Prenatal Vit-Fe Fumarate-FA (MULTIVITAMIN-PRENATAL) 27-0.8 MG TABS tablet Take 1 tablet by mouth daily at 12 noon.    Marland Kitchen UNABLE TO FIND OTC allergy med-prn     No current facility-administered medications on file prior to visit.     No Known Allergies  Family History  Problem Relation Age of Onset  . Other Mother        Bone deteriation  . Alcohol abuse Mother   . Cirrhosis Father   . Alcohol abuse Father   . Diabetes Brother   . Alcohol abuse Maternal Grandmother   . Alcohol abuse Maternal Grandfather   . Alcohol abuse Paternal Grandmother   . Alcohol abuse Paternal Grandfather   . Autism Brother   . ADD / ADHD Brother     Social History   Socioeconomic History  . Marital status: Single    Spouse name: Not on file  . Number of children: Not on file  . Years of education: Not on file  . Highest education level: Not on file  Occupational History  . Not on file  Social Needs  . Financial resource strain: Not on file  . Food insecurity:    Worry: Not on file    Inability: Not on file  . Transportation needs:    Medical: Not on file    Non-medical: Not on file  Tobacco Use  . Smoking status: Current Every Day Smoker    Packs/day: 0.25    Types: Cigarettes  . Smokeless tobacco: Never Used  . Tobacco comment: 6 cigs per day  Substance and Sexual Activity  . Alcohol use: Yes    Comment: 1 glass of wine per week  . Drug use: Not Currently   Types: Marijuana, Cocaine  . Sexual activity: Yes    Birth control/protection: Condom, None  Lifestyle  . Physical activity:    Days per week: Not on file    Minutes per session: Not on file  . Stress: Not on file  Relationships  . Social connections:    Talks on phone: Not on file    Gets together: Not on file    Attends religious service: Not on file    Active member of club or organization: Not on file    Attends meetings of clubs or organizations: Not on file    Relationship status: Not on file  Other Topics Concern  . Not on file  Social History Narrative  . Not on file   Review of Systems - See HPI.  All other ROS are negative.  BP 98/60   Pulse (!) 102   Temp 98.2 F (36.8 C) (Oral)   Resp 14   Ht 5\' 5"  (1.651 m)   Wt 180 lb (81.6 kg)   LMP  (LMP Unknown)   SpO2 98%   BMI 29.95 kg/m   Physical Exam  Constitutional: She is oriented to person, place, and time. She appears well-developed and well-nourished.  HENT:  Head: Normocephalic and atraumatic.  Eyes: Conjunctivae are normal.  Neck: Neck supple.  Cardiovascular: Normal rate, regular rhythm, normal heart sounds and intact distal pulses.  Pulmonary/Chest: Effort normal and breath sounds normal. No stridor. No respiratory distress. She has no wheezes. She has no rales. She exhibits no tenderness.  Lymphadenopathy:    She has no cervical adenopathy.  Neurological: She is alert and oriented to person, place, and time.  Psychiatric: She has a normal mood and affect.  Vitals reviewed.   Recent Results (from the past 2160 hour(s))  POCT urinalysis dipstick     Status: None   Collection Time: 10/24/17 10:57 AM  Result Value Ref Range   Color, UA     Clarity, UA     Glucose, UA Negative Negative   Bilirubin, UA     Ketones, UA neg    Spec Grav, UA  1.010 - 1.025   Blood, UA neg    pH, UA  5.0 - 8.0   Protein, UA Negative Negative   Urobilinogen, UA  0.2 or 1.0 E.U./dL   Nitrite, UA neg    Leukocytes,  UA Negative Negative   Appearance     Odor    Urinalysis, Routine w reflex microscopic     Status: None   Collection Time: 11/10/17  6:29 PM  Result Value Ref Range   Color, Urine YELLOW YELLOW   APPearance CLEAR CLEAR   Specific Gravity, Urine 1.008 1.005 - 1.030   pH 7.0 5.0 - 8.0   Glucose, UA NEGATIVE NEGATIVE mg/dL   Hgb urine dipstick NEGATIVE NEGATIVE   Bilirubin Urine NEGATIVE NEGATIVE   Ketones, ur NEGATIVE NEGATIVE mg/dL   Protein, ur NEGATIVE NEGATIVE mg/dL   Nitrite NEGATIVE NEGATIVE   Leukocytes, UA NEGATIVE NEGATIVE    Comment: Performed at [facility], [address], [place], [place] [address]  POC Urinalysis Dipstick OB     Status: Abnormal   Collection Time: 11/22/17 10:37 AM  Result Value Ref Range   Color, UA     Clarity, UA     Glucose, UA Trace (A) (none)   Bilirubin, UA     Ketones, UA neg    Spec Grav, UA  1.010 - 1.025   Blood, UA neg    pH, UA  5.0 - 8.0   POC Protein UA Negative Negative, Trace   Urobilinogen, UA  0.2 or 1.0 E.U./dL   Nitrite, UA neg    Leukocytes, UA Negative Negative   Appearance     Odor    Glucose Tolerance, 2 Hours w/1 Hour     Status: None   Collection Time: 12/18/17  8:43 AM  Result Value Ref Range   Glucose, Fasting 86 65 - 91 mg/dL   Glucose, 1 hour 604 65 - 179 mg/dL   Glucose, 2 hour 540 65 - 152 mg/dL  Antibody screen     Status: None   Collection Time: 12/18/17  8:43 AM  Result Value Ref Range   Antibody Screen Negative Negative  CBC     Status: Abnormal   Collection Time: 12/18/17  8:43 AM  Result Value Ref Range   WBC 10.6 3.4 - 10.8 x10E3/uL   RBC 4.02 3.77 - 5.28 x10E6/uL   Hemoglobin 12.5 11.1 - 15.9 g/dL   Hematocrit 98.1 19.1 - 46.6 %   MCV 92 79 - 97 fL   MCH 31.1 26.6 - 33.0 pg   MCHC 33.7 31.5 - 35.7 g/dL   RDW 47.8 (L)  12.3 - 15.4 %   Platelets 188 150 - 450 x10E3/uL  HIV antibody     Status: None   Collection Time: 12/18/17  8:43 AM  Result Value Ref Range   HIV Screen 4th Generation  wRfx Non Reactive Non Reactive  RPR     Status: None   Collection Time: 12/18/17  8:43 AM  Result Value Ref Range   RPR Ser Ql Non Reactive Non Reactive  POC Urinalysis Dipstick OB     Status: Abnormal   Collection Time: 12/18/17  9:02 AM  Result Value Ref Range   Color, UA     Clarity, UA     Glucose, UA Negative (A) (none)   Bilirubin, UA     Ketones, UA neg    Spec Grav, UA     Blood, UA neg    pH, UA     POC Protein UA Negative Negative, Trace   Urobilinogen, UA     Nitrite, UA neg    Leukocytes, UA Negative Negative   Appearance     Odor    Comprehensive metabolic panel     Status: Abnormal   Collection Time: 12/18/17 10:41 AM  Result Value Ref Range   Glucose 110 (H) 65 - 99 mg/dL   BUN 8 6 - 20 mg/dL   Creatinine, Ser 1.61 0.57 - 1.00 mg/dL   GFR calc non Af Amer 127 >59 mL/min/1.73   GFR calc Af Amer 147 >59 mL/min/1.73   BUN/Creatinine Ratio 14 9 - 23   Sodium 139 134 - 144 mmol/L   Potassium 4.4 3.5 - 5.2 mmol/L   Chloride 105 96 - 106 mmol/L   CO2 20 20 - 29 mmol/L   Calcium 9.0 8.7 - 10.2 mg/dL   Total Protein 6.2 6.0 - 8.5 g/dL   Albumin 3.7 3.5 - 5.5 g/dL   Globulin, Total 2.5 1.5 - 4.5 g/dL   Albumin/Globulin Ratio 1.5 1.2 - 2.2   Bilirubin Total <0.2 0.0 - 1.2 mg/dL   Alkaline Phosphatase 55 39 - 117 IU/L   AST 13 0 - 40 IU/L   ALT 13 0 - 32 IU/L  Pain Management Screening Profile (10S)     Status: None   Collection Time: 12/18/17 12:00 PM  Result Value Ref Range   Amphetamine Scrn, Ur Negative Cutoff=1000 ng/mL   BARBITURATE SCREEN URINE Negative Cutoff=200 ng/mL   BENZODIAZEPINE SCREEN, URINE Negative Cutoff=200 ng/mL   CANNABINOIDS UR QL SCN Negative Cutoff=20 ng/mL   Cocaine (Metab) Scrn, Ur Negative Cutoff=300 ng/mL   Opiate Scrn, Ur Negative Cutoff=300 ng/mL    Comment: Opiate test includes Codeine, Morphine, Hydromorphone, Hydrocodone.   OXYCODONE+OXYMORPHONE UR QL SCN Negative Cutoff=100 ng/mL    Comment: Test includes Oxycodone and  Oxymorphone   Phencyclidine Qn, Ur Negative Cutoff=25 ng/mL   Methadone Screen, Urine Negative Cutoff=300 ng/mL   Propoxyphene Scrn, Ur Negative Cutoff=300 ng/mL   Creatinine(Crt), U 69.9 20.0 - 300.0 mg/dL   Ph of Urine 7.3 4.5 - 8.9   PLEASE NOTE: Comment     Comment: This assay provides a preliminary unconfirmed analytical test result that may be suitable for clinical management of patients in certain situations. Drug-test results should be interpreted in the context of clinical information. Patient metabolic variables, specific drug chemistry, and specimen characteristics can affect test outcome. Technical consultation is available if a test result is inconsistent with an expected outcome. ([email or web address] or call toll-free [phone])   GC/Chlamydia Probe Amp     Status: None  Collection Time: 12/18/17 12:00 PM  Result Value Ref Range   Chlamydia trachomatis, NAA Negative Negative   Neisseria gonorrhoeae by PCR Negative Negative    Assessment/Plan: 1. Acute bacterial bronchitis Rx Amoxicillin.  Increase fluids.  Rest.  Saline nasal spray.  Probiotic.    Humidifier in bedroom.  Call or return to clinic if symptoms are not improving.  - amoxicillin (AMOXIL) 875 MG tablet; Take 1 tablet (875 mg total) by mouth 2 (two) times daily.  Dispense: 14 tablet; Refill: 0   Piedad Climes, PA-C

## 2017-12-28 NOTE — Patient Instructions (Signed)
Take antibiotic (amoxicillin) as directed.  Increase fluids.  Get plenty of rest. Continue saline rinses and OTC medications approved by your OB/GYN. Take a daily probiotic (I recommend Align or Culturelle, but even Activia Yogurt may be beneficial).  A humidifier placed in the bedroom may offer some relief for a dry, scratchy throat of nasal irritation.  Read information below on acute bronchitis. Please call or return to clinic if symptoms are not improving.  Acute Bronchitis Bronchitis is when the airways that extend from the windpipe into the lungs get red, puffy, and painful (inflamed). Bronchitis often causes thick spit (mucus) to develop. This leads to a cough. A cough is the most common symptom of bronchitis. In acute bronchitis, the condition usually begins suddenly and goes away over time (usually in 2 weeks). Smoking, allergies, and asthma can make bronchitis worse. Repeated episodes of bronchitis may cause more lung problems.  HOME CARE  Rest.  Drink enough fluids to keep your pee (urine) clear or pale yellow (unless you need to limit fluids as told by your doctor).  Only take over-the-counter or prescription medicines as told by your doctor.  Avoid smoking and secondhand smoke. These can make bronchitis worse. If you are a smoker, think about using nicotine gum or skin patches. Quitting smoking will help your lungs heal faster.  Reduce the chance of getting bronchitis again by:  Washing your hands often.  Avoiding people with cold symptoms.  Trying not to touch your hands to your mouth, nose, or eyes.  Follow up with your doctor as told.  GET HELP IF: Your symptoms do not improve after 1 week of treatment. Symptoms include:  Cough.  Fever.  Coughing up thick spit.  Body aches.  Chest congestion.  Chills.  Shortness of breath.  Sore throat.  GET HELP RIGHT AWAY IF:   You have an increased fever.  You have chills.  You have severe shortness of  breath.  You have bloody thick spit (sputum).  You throw up (vomit) often.  You lose too much body fluid (dehydration).  You have a severe headache.  You faint.  MAKE SURE YOU:   Understand these instructions.  Will watch your condition.  Will get help right away if you are not doing well or get worse. Document Released: 09/28/2007 Document Revised: 12/12/2012 Document Reviewed: 10/02/2012 [facility] Patient Information 2015 [place], [place]. This information is not intended to replace advice given to you by your health care provider. Make sure you discuss any questions you have with your health care provider.

## 2017-12-29 ENCOUNTER — Ambulatory Visit: Payer: BLUE CROSS/BLUE SHIELD | Admitting: Physician Assistant

## 2018-01-08 ENCOUNTER — Encounter: Payer: BLUE CROSS/BLUE SHIELD | Admitting: Women's Health

## 2018-01-09 DIAGNOSIS — R81 Glycosuria: Secondary | ICD-10-CM | POA: Diagnosis not present

## 2018-01-09 DIAGNOSIS — R350 Frequency of micturition: Secondary | ICD-10-CM | POA: Diagnosis not present

## 2018-01-09 DIAGNOSIS — R1030 Lower abdominal pain, unspecified: Secondary | ICD-10-CM | POA: Diagnosis not present

## 2018-01-09 DIAGNOSIS — Z3A30 30 weeks gestation of pregnancy: Secondary | ICD-10-CM | POA: Diagnosis not present

## 2018-01-09 DIAGNOSIS — R3989 Other symptoms and signs involving the genitourinary system: Secondary | ICD-10-CM | POA: Diagnosis not present

## 2018-01-22 ENCOUNTER — Encounter: Payer: Self-pay | Admitting: Women's Health

## 2018-01-22 ENCOUNTER — Ambulatory Visit (INDEPENDENT_AMBULATORY_CARE_PROVIDER_SITE_OTHER): Payer: BLUE CROSS/BLUE SHIELD | Admitting: Women's Health

## 2018-01-22 VITALS — BP 121/72 | HR 90 | Wt 180.0 lb

## 2018-01-22 DIAGNOSIS — N898 Other specified noninflammatory disorders of vagina: Secondary | ICD-10-CM

## 2018-01-22 DIAGNOSIS — Z1389 Encounter for screening for other disorder: Secondary | ICD-10-CM

## 2018-01-22 DIAGNOSIS — O26893 Other specified pregnancy related conditions, third trimester: Secondary | ICD-10-CM | POA: Diagnosis not present

## 2018-01-22 DIAGNOSIS — Z3A32 32 weeks gestation of pregnancy: Secondary | ICD-10-CM

## 2018-01-22 DIAGNOSIS — Z331 Pregnant state, incidental: Secondary | ICD-10-CM

## 2018-01-22 DIAGNOSIS — Z3403 Encounter for supervision of normal first pregnancy, third trimester: Secondary | ICD-10-CM

## 2018-01-22 LAB — POCT URINALYSIS DIPSTICK OB
GLUCOSE, UA: NEGATIVE
Ketones, UA: NEGATIVE
LEUKOCYTES UA: NEGATIVE
Nitrite, UA: NEGATIVE
POC,PROTEIN,UA: NEGATIVE
RBC UA: NEGATIVE

## 2018-01-22 LAB — POCT WET PREP (WET MOUNT)
CLUE CELLS WET PREP WHIFF POC: NEGATIVE
Trichomonas Wet Prep HPF POC: ABSENT

## 2018-01-22 NOTE — Progress Notes (Signed)
LOW-RISK PREGNANCY VISIT Patient name: Angela Moran MRN [id number]  Date of birth: [birth year] Chief Complaint:   Routine Prenatal Visit (vaginal itching) and abnormal leakage ( )  History of Present Illness:   Angela Moran is a 27 y.o. G1P0 female at [redacted]w[redacted]d with an Estimated Date of Delivery: 03/18/18 being seen today for ongoing management of a low-risk pregnancy.  Today she reports vulvar itching and thick nonodorous d/c. Still drinking wine/beer, has prayed about it, only does it when mad at boyfriend. . Contractions: Regular.  .  Movement: Present. denies leaking of fluid. Review of Systems:   Pertinent items are noted in HPI Denies abnormal vaginal discharge w/ itching/odor/irritation, headaches, visual changes, shortness of breath, chest pain, abdominal pain, severe nausea/vomiting, or problems with urination or bowel movements unless otherwise stated above. Pertinent History Reviewed:  Reviewed past medical,surgical, social, obstetrical and family history.  Reviewed problem list, medications and allergies. Physical Assessment:   Vitals:   01/22/18 0848  BP: 121/72  Pulse: 90  Weight: 180 lb (81.6 kg)  Body mass index is 29.95 kg/m.        Physical Examination:   General appearance: Well appearing, and in no distress  Mental status: Alert, oriented to person, place, and time  Skin: Warm & dry  Cardiovascular: Normal heart rate noted  Respiratory: Normal respiratory effort, no distress  Abdomen: Soft, gravid, nontender  Pelvic: large amt thick clumpy d/c c/w yeast         Extremities: Edema: Trace  Fetal Status: Fetal Heart Rate (bpm): 149 Fundal Height: 30 cm Movement: Present    Results for orders placed or performed in visit on 01/22/18 (from the past 24 hour(s))  POC Urinalysis Dipstick OB   Collection Time: 01/22/18  8:56 AM  Result Value Ref Range   Color, UA     Clarity, UA     Glucose, UA Negative Negative   Bilirubin, UA     Ketones, UA neg    Spec  Grav, UA     Blood, UA neg    pH, UA     POC Protein UA Negative Negative, Trace   Urobilinogen, UA     Nitrite, UA neg    Leukocytes, UA Negative Negative   Appearance     Odor    POCT Wet Prep [facility] [place])   Collection Time: 01/22/18  9:12 AM  Result Value Ref Range   Source Wet Prep POC vaginal    WBC, Wet Prep HPF POC none    Bacteria Wet Prep HPF POC None (A) Few   BACTERIA WET PREP MORPHOLOGY POC     Clue Cells Wet Prep HPF POC None None   Clue Cells Wet Prep Whiff POC Negative Whiff    Yeast Wet Prep HPF POC Moderate (A) None   KOH Wet Prep POC     Trichomonas Wet Prep HPF POC Absent Absent    Assessment & Plan:  1) Low-risk pregnancy G1P0 at [redacted]w[redacted]d with an Estimated Date of Delivery: 03/18/18   2) Vulvovaginal candida, otc monistat 7, no sex while using  3) ETOH use> again advised complete cessation, find something else to do (prayer/talk to someone, etc) when mad at bf   Meds: No orders of the defined types were placed in this encounter.  Labs/procedures today: wet prpe  Plan:  Continue routine obstetrical care   Reviewed: Preterm labor symptoms and general obstetric precautions including but not limited to vaginal bleeding, contractions, leaking of fluid and  fetal movement were reviewed in detail with the patient.  All questions were answered  Follow-up: Return in about 2 weeks (around 02/05/2018) for LROB.  Orders Placed This Encounter  Procedures  . POC Urinalysis Dipstick OB  . POCT Wet Prep [facility])   Cheral Marker CNM, [facility] 01/22/2018 9:13 AM

## 2018-01-22 NOTE — Patient Instructions (Addendum)
Angela Moran, I greatly value your feedback.  If you receive a survey following your visit with [place] today, we appreciate you taking the time to fill it out.  Thanks, Joellyn Haff, CNM, WHNP-BC  You can try a 7 night over the counter yeast cream such as Monistat 7. It does not need to be brand name, generic is fine, but please make sure it is a 7 night cream.     Call the office [phone]) or go to [facility] if:  You begin to have strong, frequent contractions  Your water breaks.  Sometimes it is a big gush of fluid, sometimes it is just a trickle that keeps getting your panties wet or running down your legs  You have vaginal bleeding.  It is normal to have a small amount of spotting if your cervix was checked.   You don't feel your baby moving like normal.  If you don't, get you something to eat and drink and lay down and focus on feeling your baby move.  You should feel at least 10 movements in 2 hours.  If you don't, you should call the office or go to [facility].     Preterm Labor and Birth Information The normal length of a pregnancy is 39-41 weeks. Preterm labor is when labor starts before 37 completed weeks of pregnancy. What are the risk factors for preterm labor? Preterm labor is more likely to occur in women who:  Have certain infections during pregnancy such as a bladder infection, sexually transmitted infection, or infection inside the uterus (chorioamnionitis).  Have a shorter-than-normal cervix.  Have gone into preterm labor before.  Have had surgery on their cervix.  Are younger than age 59 or older than age 74.  Are African American.  Are pregnant with twins or multiple babies (multiple gestation).  Take street drugs or smoke while pregnant.  Do not gain enough weight while pregnant.  Became pregnant shortly after having been pregnant.  What are the symptoms of preterm labor? Symptoms of preterm labor include:  Cramps similar to those that can  happen during a menstrual period. The cramps may happen with diarrhea.  Pain in the abdomen or lower back.  Regular uterine contractions that may feel like tightening of the abdomen.  A feeling of increased pressure in the pelvis.  Increased watery or bloody mucus discharge from the vagina.  Water breaking (ruptured amniotic sac).  Why is it important to recognize signs of preterm labor? It is important to recognize signs of preterm labor because babies who are born prematurely may not be fully developed. This can put them at an increased risk for:  Long-term (chronic) heart and lung problems.  Difficulty immediately after birth with regulating body systems, including blood sugar, body temperature, heart rate, and breathing rate.  Bleeding in the brain.  Cerebral palsy.  Learning difficulties.  Death.  These risks are highest for babies who are born before 34 weeks of pregnancy. How is preterm labor treated? Treatment depends on the length of your pregnancy, your condition, and the health of your baby. It may involve:  Having a stitch (suture) placed in your cervix to prevent your cervix from opening too early (cerclage).  Taking or being given medicines, such as: ? Hormone medicines. These may be given early in pregnancy to help support the pregnancy. ? Medicine to stop contractions. ? Medicines to help mature the baby's lungs. These may be prescribed if the risk of delivery is high. ? Medicines  to prevent your baby from developing cerebral palsy.  If the labor happens before 34 weeks of pregnancy, you may need to stay in the hospital. What should I do if I think I am in preterm labor? If you think that you are going into preterm labor, call your health care provider right away. How can I prevent preterm labor in future pregnancies? To increase your chance of having a full-term pregnancy:  Do not use any tobacco products, such as cigarettes, chewing tobacco, and  e-cigarettes. If you need help quitting, ask your health care provider.  Do not use street drugs or medicines that have not been prescribed to you during your pregnancy.  Talk with your health care provider before taking any herbal supplements, even if you have been taking them regularly.  Make sure you gain a healthy amount of weight during your pregnancy.  Watch for infection. If you think that you might have an infection, get it checked right away.  Make sure to tell your health care provider if you have gone into preterm labor before.  This information is not intended to replace advice given to you by your health care provider. Make sure you discuss any questions you have with your health care provider. Document Released: 07/02/2003 Document Revised: 09/22/2015 Document Reviewed: 09/02/2015 Elsevier Interactive Patient Education  2018 [employer].

## 2018-01-26 LAB — OB RESULTS CONSOLE GBS: STREP GROUP B AG: POSITIVE

## 2018-02-06 ENCOUNTER — Ambulatory Visit (INDEPENDENT_AMBULATORY_CARE_PROVIDER_SITE_OTHER): Payer: BLUE CROSS/BLUE SHIELD | Admitting: Obstetrics & Gynecology

## 2018-02-06 ENCOUNTER — Encounter: Payer: Self-pay | Admitting: Obstetrics & Gynecology

## 2018-02-06 ENCOUNTER — Other Ambulatory Visit: Payer: Self-pay

## 2018-02-06 VITALS — BP 105/69 | HR 93 | Wt 182.0 lb

## 2018-02-06 DIAGNOSIS — Z3403 Encounter for supervision of normal first pregnancy, third trimester: Secondary | ICD-10-CM

## 2018-02-06 DIAGNOSIS — Z1389 Encounter for screening for other disorder: Secondary | ICD-10-CM

## 2018-02-06 DIAGNOSIS — Z3A34 34 weeks gestation of pregnancy: Secondary | ICD-10-CM

## 2018-02-06 DIAGNOSIS — Z331 Pregnant state, incidental: Secondary | ICD-10-CM

## 2018-02-06 LAB — POCT URINALYSIS DIPSTICK OB
Glucose, UA: NEGATIVE
Ketones, UA: NEGATIVE
LEUKOCYTES UA: NEGATIVE
NITRITE UA: NEGATIVE
PROTEIN: NEGATIVE
RBC UA: NEGATIVE

## 2018-02-06 MED ORDER — ACYCLOVIR 400 MG PO TABS: 400.0000 mg | ORAL_TABLET | Freq: Three times a day (TID) | ORAL | 2 refills | Status: DC

## 2018-02-06 NOTE — Progress Notes (Addendum)
   LOW-RISK PREGNANCY VISIT Patient name: Angela Moran MRN [id number]  Date of birth: 10-31-1990 Chief Complaint:   Routine Prenatal Visit  History of Present Illness:   Angela Moran is a 27 y.o. G1P0 female at [redacted]w[redacted]d with an Estimated Date of Delivery: 03/18/18 being seen today for ongoing management of a low-risk pregnancy.  Today she reports mucousy discharge no odor or itching vaginally. Contractions: Irregular. Vag. Bleeding: None.  Movement: Present. denies leaking of fluid. Review of Systems:   Pertinent items are noted in HPI Denies abnormal vaginal discharge w/ itching/odor/irritation, headaches, visual changes, shortness of breath, chest pain, abdominal pain, severe nausea/vomiting, or problems with urination or bowel movements unless otherwise stated above. Pertinent History Reviewed:  Reviewed past medical,surgical, social, obstetrical and family history.  Reviewed problem list, medications and allergies. Physical Assessment:   Vitals:   02/06/18 0853  BP: 105/69  Pulse: 93  Weight: 182 lb (82.6 kg)  Body mass index is 30.29 kg/m.        Physical Examination:   General appearance: Well appearing, and in no distress  Mental status: Alert, oriented to person, place, and time  Skin: Warm & dry  Cardiovascular: Normal heart rate noted  Respiratory: Normal respiratory effort, no distress  Abdomen: Soft, gravid, nontender  Pelvic: Cervical exam deferred         Extremities: Edema: None SSE + cervical mucous no yeast no BV Fetal Status: Fetal Heart Rate (bpm): 143 Fundal Height: 34 cm Movement: Present    Results for orders placed or performed in visit on 02/06/18 (from the past 24 hour(s))  POC Urinalysis Dipstick OB   Collection Time: 02/06/18  8:52 AM  Result Value Ref Range   Color, UA     Clarity, UA     Glucose, UA Negative Negative   Bilirubin, UA     Ketones, UA neg    Spec Grav, UA     Blood, UA neg    pH, UA     POC Protein UA Negative Negative,  Trace   Urobilinogen, UA     Nitrite, UA neg    Leukocytes, UA Negative Negative   Appearance     Odor      Assessment & Plan:  1) Low-risk pregnancy G1P0 at [redacted]w[redacted]d with an Estimated Date of Delivery: 03/18/18   2) pt denies any on going alcohol or drug use   Meds:  Meds ordered this encounter  Medications  . acyclovir (ZOVIRAX) 400 MG tablet    Sig: Take 1 tablet (400 mg total) by mouth 3 (three) times daily.    Dispense:  90 tablet    Refill:  2   Labs/procedures today:   Plan:  Continue routine obstetrical care   Reviewed: Preterm labor symptoms and general obstetric precautions including but not limited to vaginal bleeding, contractions, leaking of fluid and fetal movement were reviewed in detail with the patient.  All questions were answered  Follow-up: Return in about 2 weeks (around 02/20/2018) for LROB.  Orders Placed This Encounter  Procedures  . POC Urinalysis Dipstick OB   [name] Jacquline Terrill  02/06/2018 9:25 AM

## 2018-02-06 NOTE — Addendum Note (Signed)
Addended by: Lazaro Arms on: 02/06/2018 09:25 AM   Modules accepted: Orders

## 2018-02-23 ENCOUNTER — Other Ambulatory Visit: Payer: Self-pay

## 2018-02-23 ENCOUNTER — Encounter (HOSPITAL_COMMUNITY): Payer: Self-pay

## 2018-02-23 ENCOUNTER — Encounter: Payer: Self-pay | Admitting: Obstetrics & Gynecology

## 2018-02-23 ENCOUNTER — Inpatient Hospital Stay (HOSPITAL_BASED_OUTPATIENT_CLINIC_OR_DEPARTMENT_OTHER): Payer: BLUE CROSS/BLUE SHIELD

## 2018-02-23 ENCOUNTER — Inpatient Hospital Stay (HOSPITAL_COMMUNITY)
Admission: AD | Admit: 2018-02-23 | Discharge: 2018-02-25 | DRG: 832 | Disposition: A | Discharge status: Home / Self Care | Payer: BLUE CROSS/BLUE SHIELD | Attending: Obstetrics & Gynecology | Admitting: Obstetrics & Gynecology

## 2018-02-23 DIAGNOSIS — O4690 Antepartum hemorrhage, unspecified, unspecified trimester: Secondary | ICD-10-CM

## 2018-02-23 DIAGNOSIS — A6000 Herpesviral infection of urogenital system, unspecified: Secondary | ICD-10-CM | POA: Diagnosis present

## 2018-02-23 DIAGNOSIS — F1190 Opioid use, unspecified, uncomplicated: Secondary | ICD-10-CM | POA: Diagnosis not present

## 2018-02-23 DIAGNOSIS — O99343 Other mental disorders complicating pregnancy, third trimester: Secondary | ICD-10-CM

## 2018-02-23 DIAGNOSIS — O99313 Alcohol use complicating pregnancy, third trimester: Secondary | ICD-10-CM | POA: Diagnosis not present

## 2018-02-23 DIAGNOSIS — O98513 Other viral diseases complicating pregnancy, third trimester: Secondary | ICD-10-CM | POA: Diagnosis not present

## 2018-02-23 DIAGNOSIS — F17210 Nicotine dependence, cigarettes, uncomplicated: Secondary | ICD-10-CM | POA: Diagnosis present

## 2018-02-23 DIAGNOSIS — O99323 Drug use complicating pregnancy, third trimester: Secondary | ICD-10-CM | POA: Diagnosis present

## 2018-02-23 DIAGNOSIS — F1410 Cocaine abuse, uncomplicated: Secondary | ICD-10-CM | POA: Diagnosis present

## 2018-02-23 DIAGNOSIS — N930 Postcoital and contact bleeding: Secondary | ICD-10-CM | POA: Diagnosis present

## 2018-02-23 DIAGNOSIS — O98313 Other infections with a predominantly sexual mode of transmission complicating pregnancy, third trimester: Secondary | ICD-10-CM | POA: Diagnosis not present

## 2018-02-23 DIAGNOSIS — O4700 False labor before 37 completed weeks of gestation, unspecified trimester: Secondary | ICD-10-CM | POA: Diagnosis present

## 2018-02-23 DIAGNOSIS — F1490 Cocaine use, unspecified, uncomplicated: Secondary | ICD-10-CM | POA: Diagnosis not present

## 2018-02-23 DIAGNOSIS — B009 Herpesviral infection, unspecified: Secondary | ICD-10-CM | POA: Diagnosis present

## 2018-02-23 DIAGNOSIS — Z3A36 36 weeks gestation of pregnancy: Secondary | ICD-10-CM

## 2018-02-23 DIAGNOSIS — Z3403 Encounter for supervision of normal first pregnancy, third trimester: Secondary | ICD-10-CM

## 2018-02-23 DIAGNOSIS — O99333 Smoking (tobacco) complicating pregnancy, third trimester: Secondary | ICD-10-CM | POA: Diagnosis present

## 2018-02-23 DIAGNOSIS — O4693 Antepartum hemorrhage, unspecified, third trimester: Secondary | ICD-10-CM | POA: Diagnosis not present

## 2018-02-23 HISTORY — DX: Encounter for supervision of normal pregnancy, unspecified, unspecified trimester: Z34.90

## 2018-02-23 HISTORY — DX: Unspecified convulsions: R56.9

## 2018-02-23 HISTORY — DX: Unspecified infectious disease: B99.9

## 2018-02-23 LAB — TYPE AND SCREEN
ABO/RH(D): A POS
Antibody Screen: NEGATIVE

## 2018-02-23 LAB — CBC
HEMATOCRIT: 38.6 % (ref 36.0–46.0)
HEMOGLOBIN: 12.8 g/dL (ref 12.0–15.0)
MCH: 31.2 pg (ref 26.0–34.0)
MCHC: 33.2 g/dL (ref 30.0–36.0)
MCV: 94.1 fL (ref 80.0–100.0)
Platelets: 185 10*3/uL (ref 150–400)
RBC: 4.1 MIL/uL (ref 3.87–5.11)
RDW: 13.9 % (ref 11.5–15.5)
WBC: 10.4 10*3/uL (ref 4.0–10.5)

## 2018-02-23 LAB — ABO/RH: ABO/RH(D): A POS

## 2018-02-23 LAB — RAPID URINE DRUG SCREEN, HOSP PERFORMED
AMPHETAMINES: NOT DETECTED
BARBITURATES: NOT DETECTED
Benzodiazepines: NOT DETECTED
COCAINE: NOT DETECTED
OPIATES: NOT DETECTED
TETRAHYDROCANNABINOL: NOT DETECTED

## 2018-02-23 LAB — GROUP B STREP BY PCR: GROUP B STREP BY PCR: POSITIVE — AB

## 2018-02-23 MED ORDER — OXYTOCIN 40 UNITS IN LACTATED RINGERS INFUSION - SIMPLE MED: 2.5000 [IU]/h | INTRAVENOUS | Status: DC

## 2018-02-23 MED ORDER — VALACYCLOVIR HCL 500 MG PO TABS
1000.0000 mg | ORAL_TABLET | Freq: Two times a day (BID) | ORAL | Status: DC
  Administered 2018-02-23 – 2018-02-25 (×5): 1000 mg via ORAL
  Filled 2018-02-23 (×5): qty 2

## 2018-02-23 MED ORDER — PENICILLIN G 3 MILLION UNITS IVPB - SIMPLE MED: 3.0000 10*6.[IU] | INTRAVENOUS | Status: DC

## 2018-02-23 MED ORDER — FLEET ENEMA 7-19 GM/118ML RE ENEM: 1.0000 | ENEMA | RECTAL | Status: DC | PRN

## 2018-02-23 MED ORDER — ZOLPIDEM TARTRATE 5 MG PO TABS
5.0000 mg | ORAL_TABLET | Freq: Every evening | ORAL | Status: DC | PRN
  Filled 2018-02-23 (×2): qty 1

## 2018-02-23 MED ORDER — FENTANYL CITRATE (PF) 100 MCG/2ML IJ SOLN: 100.0000 ug | INTRAMUSCULAR | Status: DC | PRN

## 2018-02-23 MED ORDER — SOD CITRATE-CITRIC ACID 500-334 MG/5ML PO SOLN: 30.0000 mL | ORAL | Status: DC | PRN

## 2018-02-23 MED ORDER — BETAMETHASONE SOD PHOS & ACET 6 (3-3) MG/ML IJ SUSP
12.0000 mg | INTRAMUSCULAR | Status: AC
  Administered 2018-02-23 – 2018-02-24 (×2): 12 mg via INTRAMUSCULAR
  Filled 2018-02-23 (×2): qty 2

## 2018-02-23 MED ORDER — SODIUM CHLORIDE 0.9 % IV SOLN
INTRAVENOUS | Status: DC
  Administered 2018-02-23: 07:00:00 via INTRAVENOUS

## 2018-02-23 MED ORDER — OXYTOCIN BOLUS FROM INFUSION: 500.0000 mL | Freq: Once | INTRAVENOUS | Status: DC

## 2018-02-23 MED ORDER — SODIUM CHLORIDE 0.9 % IV SOLN
5.0000 10*6.[IU] | Freq: Once | INTRAVENOUS | Status: DC
  Filled 2018-02-23: qty 5

## 2018-02-23 MED ORDER — ACYCLOVIR 400 MG PO TABS
400.0000 mg | ORAL_TABLET | Freq: Three times a day (TID) | ORAL | Status: DC
  Administered 2018-02-23: 400 mg via ORAL
  Filled 2018-02-23 (×4): qty 1

## 2018-02-23 MED ORDER — LIDOCAINE HCL (PF) 1 % IJ SOLN
30.0000 mL | INTRAMUSCULAR | Status: DC | PRN
  Filled 2018-02-23: qty 30

## 2018-02-23 MED ORDER — PRENATAL MULTIVITAMIN CH
1.0000 | ORAL_TABLET | Freq: Every day | ORAL | Status: DC
  Administered 2018-02-24: 1 via ORAL
  Filled 2018-02-23: qty 1

## 2018-02-23 MED ORDER — DOCUSATE SODIUM 100 MG PO CAPS
100.0000 mg | ORAL_CAPSULE | Freq: Every day | ORAL | Status: DC
  Administered 2018-02-24 – 2018-02-25 (×2): 100 mg via ORAL
  Filled 2018-02-23 (×2): qty 1

## 2018-02-23 MED ORDER — ONDANSETRON HCL 4 MG/2ML IJ SOLN: 4.0000 mg | Freq: Four times a day (QID) | INTRAMUSCULAR | Status: DC | PRN

## 2018-02-23 MED ORDER — LACTATED RINGERS IV SOLN: 500.0000 mL | INTRAVENOUS | Status: DC | PRN

## 2018-02-23 MED ORDER — ACETAMINOPHEN 325 MG PO TABS: 650.0000 mg | ORAL_TABLET | ORAL | Status: DC | PRN

## 2018-02-23 MED ORDER — CALCIUM CARBONATE ANTACID 500 MG PO CHEW: 2.0000 | CHEWABLE_TABLET | ORAL | Status: DC | PRN

## 2018-02-23 MED ORDER — LACTATED RINGERS IV SOLN
INTRAVENOUS | Status: AC
  Administered 2018-02-23 – 2018-02-24 (×3): via INTRAVENOUS

## 2018-02-23 MED ORDER — LACTATED RINGERS IV SOLN
INTRAVENOUS | Status: DC
  Administered 2018-02-23 (×2): via INTRAVENOUS

## 2018-02-23 NOTE — MAU Provider Note (Signed)
History     CSN: [id number]  Arrival date and time: 02/23/18 2952   First Provider Initiated Contact with Patient 02/23/18 [phone]      Chief Complaint  Patient presents with  . Vaginal Bleeding    [redacted] weeks pregnant   HPI Angela Moran 27 y.o. [redacted]w[redacted]d  Was having intercourse and began to have vaginal bleeding.  Went to [facility] and was transferred to [facility]  [place] to [facility].  Is a current smoker (6-8 cig/day)  and admits to cocaine use this week (3 days ago).  States she has been having some problems.   OB History    Gravida  1   Para      Term      Preterm      AB      Living        SAB      TAB      Ectopic      Multiple      Live Births              Past Medical History:  Diagnosis Date  . Depression    doing ok  . HSV-2 infection   . Infection    UTI  . Pregnant   . 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 Vaginal Pap smear, abnormal    clear on repeat    Past Surgical History:  Procedure Laterality Date  . NO PAST SURGERIES      Family History  Problem Relation Age of Onset  . Other Mother        Bone deteriation  . Alcohol abuse Mother   . Cirrhosis Father   . Alcohol abuse Father   . Diabetes Brother   . Alcohol abuse Maternal Grandmother   . Alcohol abuse Maternal Grandfather   . Alcohol abuse Paternal Grandmother   . Alcohol abuse Paternal Grandfather   . Autism Brother   . ADD / ADHD Brother     Social History   Tobacco Use  . Smoking status: Current Every Day Smoker    Packs/day: 0.25    Years: 6.00    Pack years: 1.50    Types: Cigarettes  . Smokeless tobacco: Never Used  . Tobacco comment: 6 cigs per day, hx of juul usuage for about 2 wks  Substance Use Topics  . Alcohol use: Yes    Comment: 1 glass of wine per week  . Drug use: Not Currently    Types: Marijuana, Cocaine    Comment: last was approx 3 months ago    Allergies: No Known Allergies  Medications Prior to Admission   Medication Sig Dispense Refill Last Dose  . acyclovir (ZOVIRAX) 400 MG tablet Take 1 tablet (400 mg total) by mouth 3 (three) times daily. 90 tablet 2   . amoxicillin (AMOXIL) 875 MG tablet Take 1 tablet (875 mg total) by mouth 2 (two) times daily. (Patient not taking: Reported on 01/22/2018) 14 tablet 0 Not Taking  . Prenatal Vit-Fe Fumarate-FA (MULTIVITAMIN-PRENATAL) 27-0.8 MG TABS tablet Take 1 tablet by mouth daily at 12 noon.   Taking  . UNABLE TO FIND OTC allergy med-prn   Taking    Review of Systems  Constitutional: Negative for fever.  Gastrointestinal: Negative for abdominal pain, nausea and vomiting.  Genitourinary: Positive for vaginal bleeding. Negative for vaginal discharge.   Physical Exam   Blood pressure 118/69, pulse 72, temperature 97.7 F (36.5 C), temperature  source Oral, resp. rate 16, height 5\' 6"  (1.676 m), weight 83 kg, SpO2 97 %.  Physical Exam  Nursing note and vitals reviewed. Constitutional: She is oriented to person, place, and time. She appears well-developed and well-nourished.  HENT:  Head: Normocephalic.  Eyes: EOM are normal.  Neck: Neck supple.  GI: Soft. There is no tenderness. There is no rebound and no guarding.  Genitourinary:  Genitourinary Comments: Speculum exam: Small amount of bright blood on peripad Vagina - Small to moderate amount of bright blood Cervix - No active bleeding, but mixed with mucous and unable to clear completely from cervix Bimanual exam: Cervix 2-3, 70%, vertex -2 station Uterus non tender Adnexa non tender, no masses bilaterally Chaperone present for exam.   Musculoskeletal: Normal range of motion.  Neurological: She is alert and oriented to person, place, and time.  Skin: Skin is warm and dry.  Psychiatric: She has a normal mood and affect.    MAU Course  Procedures Results for orders placed or performed during the hospital encounter of 02/23/18 (from the past 24 hour(s))  Urine rapid drug screen (hosp  performed)     Status: None   Collection Time: 02/23/18  8:33 AM  Result Value Ref Range   Opiates NONE DETECTED NONE DETECTED   Cocaine NONE DETECTED NONE DETECTED   Benzodiazepines NONE DETECTED NONE DETECTED   Amphetamines NONE DETECTED NONE DETECTED   Tetrahydrocannabinol NONE DETECTED NONE DETECTED   Barbiturates NONE DETECTED NONE DETECTED  CBC     Status: None   Collection Time: 02/23/18  9:44 AM  Result Value Ref Range   WBC 10.4 4.0 - 10.5 K/uL   RBC 4.10 3.87 - 5.11 MIL/uL   Hemoglobin 12.8 12.0 - 15.0 g/dL   HCT 78.2 95.6 - 21.3 %   MCV 94.1 80.0 - 100.0 fL   MCH 31.2 26.0 - 34.0 pg   MCHC 33.2 30.0 - 36.0 g/dL   RDW 08.6 57.8 - 46.9 %   Platelets 185 150 - 400 K/uL    MDM Discussed with client the importance of not using cocaine - can cause abruption and is life threatening for her and for her baby. Reviewed ultrasound and consulted with Dr. Macon Large - will admit to labor  Assessment and Plan  Unexplained vaginal bleeding at [redacted]w[redacted]d Slow cervical change with contractions  Plan Admit in possible labor - small amount of cervical change  Neely Kammerer L Kinnedy Mongiello 02/23/2018, 9:31 AM

## 2018-02-23 NOTE — Progress Notes (Signed)
CSW consulted to speak with MOB about concerns of losing her baby due to recent drug use (cocaine, heroin and etoh). CSW met with MOB at bedside to discuss concerns. CSW inquired MOB concerns, MOB initially confused as to why CSW was meeting with MOB. CSW explained reason for consult and inquired about MOB concerns. MOB reported that she recently used cocaine and is afraid that she will lose her baby. MOB became tearful when speaking with CSW about her concerns. CSW inquired about MOB's knowledge of pregnancy when using cocaine, MOB confirmed that she knew she was pregnant and decided to use because of life stressors.  CSW spoke with MOB about making better decisions and focusing on labor and delivery and bonding with baby. CSW explained that MOB will be assessed after giving birth and that CPS would be contacted after assessment if needed. MOB appeared to be calmer after conversation. CSW will follow up with MOB after delivery to complete assessment.   Abundio Miu, [place] Worker [facility] Cell#: [phone]

## 2018-02-23 NOTE — ED Triage Notes (Signed)
Pt is approx [redacted] weeks pregnant, states she was having sex when she started bleeding with blood "running down my leg".   Pt reports minimal pain at this time with some "braxton hicks contractions as per usual"

## 2018-02-23 NOTE — Progress Notes (Signed)
Patient's contractions have spaced and cervical exam unchanged after 2 hours. Still having mild amount of vaginal bleeding noted at introitus and on glove after cervical check. Discussed with patient need for C/S given symptomatic active lesion. Patient started on prophylaxis at 36 weeks but reports only taking 2 doses of suppression. Started patient on treatment dose of Valtrex for 10 days. If not laboring, vaginal bleeding stable, will need C/S scheduled at 39 weeks. Patient to be transferred to antepartum to complete BMZ and monitor bleeding. Continue IVF as on clear liquid diet overnight. Plan of care discussed with Dr. Macon Large.   Cristal Deer. Earlene Plater, DO OB/GYN Fellow

## 2018-02-23 NOTE — H&P (Addendum)
OBSTETRIC ADMISSION HISTORY AND PHYSICAL  Angela Moran is a 27 y.o. female G1P0000 with IUP at [redacted]w[redacted]d by 1st trimester [place] at [redacted]w[redacted]d presenting with concern for PTL.   Reports good fetal movement. Endorses vaginal bleeding since having sexual intercourse this morning. Also feeling contractions every 2 minutes. Admits to cocaine use in the past week and opioids in the past month. She also thinks she has been having an HSV outbreak for the past 5 days and has taken acyclovir twice in the past week.   She received her prenatal care at [facility].  Support person in labor: Father of baby, Angela Moran . Anatomy U/S: normal   Prenatal History/Complications: . Drug use - cocaine in the past week, opioids in the past month . Active HSV lesions  Past Medical History: Past Medical History:  Diagnosis Date  . Depression    doing ok  . HSV-2 infection   . Infection    UTI  . Pregnant   . 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 Vaginal Pap smear, abnormal    clear on repeat   Past Surgical History: Past Surgical History:  Procedure Laterality Date  . NO PAST SURGERIES     Obstetrical History: OB History    Gravida  1   Para  0   Term  0   Preterm  0   AB  0   Living  0     SAB  0   TAB  0   Ectopic  0   Multiple  0   Live Births  0          Social History: Social History   Socioeconomic History  . Marital status: Married    Spouse name: Not on file  . Number of children: Not on file  . Years of education: Not on file  . Highest education level: Not on file  Occupational History  . Not on file  Social Needs  . Financial resource strain: Not on file  . Food insecurity:    Worry: Not on file    Inability: Not on file  . Transportation needs:    Medical: Not on file    Non-medical: Not on file  Tobacco Use  . Smoking status: Current Every Day Smoker    Packs/day: 0.25    Years: 6.00    Pack years: 1.50    Types: Cigarettes  .  Smokeless tobacco: Never Used  . Tobacco comment: 6 cigs per day, hx of juul usuage for about 2 wks  Substance and Sexual Activity  . Alcohol use: Yes    Comment: 1/2 glass of wine per week  . Drug use: Not Currently    Types: Marijuana, Cocaine, Heroin    Comment: last used last week  . Sexual activity: Yes    Birth control/protection: Condom, None  Lifestyle  . Physical activity:    Days per week: Not on file    Minutes per session: Not on file  . Stress: Not on file  Relationships  . Social connections:    Talks on phone: Not on file    Gets together: Not on file    Attends religious service: Not on file    Active member of club or organization: Not on file    Attends meetings of clubs or organizations: Not on file    Relationship status: Not on file  Other Topics Concern  . Not on file  Social History Narrative  . Not on file    Family History: Family History  Problem Relation Age of Onset  . Other Mother        Bone deteriation  . Alcohol abuse Mother   . Cirrhosis Father   . Alcohol abuse Father   . Diabetes Brother   . Alcohol abuse Maternal Grandmother   . Alcohol abuse Maternal Grandfather   . Alcohol abuse Paternal Grandmother   . Alcohol abuse Paternal Grandfather   . Autism Brother   . ADD / ADHD Brother     Allergies: No Known Allergies  Medications Prior to Admission  Medication Sig Dispense Refill Last Dose  . acyclovir (ZOVIRAX) 400 MG tablet Take 1 tablet (400 mg total) by mouth 3 (three) times daily. 90 tablet 2 02/22/2018 at Unknown time  . loratadine (CLARITIN) 10 MG tablet Take 10 mg by mouth daily as needed for allergies.   Past Week at Unknown time  . Prenatal Vit-Fe Fumarate-FA (MULTIVITAMIN-PRENATAL) 27-0.8 MG TABS tablet Take 1 tablet by mouth daily at 12 noon.   02/22/2018 at Unknown time    Review of Systems  All systems reviewed and negative except as stated in HPI  Blood pressure 122/70, pulse 69, temperature 97.8 F (36.6 C),  temperature source Oral, resp. rate 18, height 5\' 6"  (1.676 m), weight 83 kg, SpO2 97 %. General appearance: alert, cooperative and no distress Lungs: no respiratory distress Heart: regular rate  Abdomen: soft, non-tender; gravid  Pelvic: slow but active vaginal bleeding, erythematous, non-tender lesion on the clitoral hood  Extremities: Homans sign is negative, no sign of DVT Presentation: cephalic Fetal monitoring: FHR baseline 120 with accelerations and no decelerations Uterine activity: contractions every 2-3 minutes Dilation: 3 Effacement (%): 80 Station: -2 Exam by:: Dr. Earlene Plater  Prenatal labs: ABO, Rh: --/--/A POS (11/01 1191) Antibody: NEG (11/01 4782) Rubella: 1.28 (05/01 1235) RPR: Non Reactive (08/26 0843)  HBsAg: Negative (05/01 1235)  HIV: Non Reactive (08/26 0843)  GBS:   positive  Glucola: normal  Genetic screening: normal   Prenatal Transfer Tool  Maternal Diabetes: No Genetic Screening: Normal Maternal Ultrasounds/Referrals: Normal Fetal Ultrasounds or other Referrals:  None Maternal Substance Abuse:  Yes:  Type: Smoker, Cocaine, Prescription drugs Significant Maternal Medications:  Meds include: Other: Acyclovir Significant Maternal Lab Results: None  Results for orders placed or performed during the hospital encounter of 02/23/18 (from the past 24 hour(s))  Urine rapid drug screen (hosp performed)   Collection Time: 02/23/18  8:33 AM  Result Value Ref Range   Opiates NONE DETECTED NONE DETECTED   Cocaine NONE DETECTED NONE DETECTED   Benzodiazepines NONE DETECTED NONE DETECTED   Amphetamines NONE DETECTED NONE DETECTED   Tetrahydrocannabinol NONE DETECTED NONE DETECTED   Barbiturates NONE DETECTED NONE DETECTED  Type and screen [facility] [place] [facility]   Collection Time: 02/23/18  9:43 AM  Result Value Ref Range   ABO/RH(D) A POS    Antibody Screen NEG    Sample Expiration      02/26/2018 Performed at [facility], [address],  [place], [place] [address]   CBC   Collection Time: 02/23/18  9:44 AM  Result Value Ref Range   WBC 10.4 4.0 - 10.5 K/uL   RBC 4.10 3.87 - 5.11 MIL/uL   Hemoglobin 12.8 12.0 - 15.0 g/dL   HCT 30.8 65.7 - 84.6 %   MCV 94.1 80.0 - 100.0 fL   MCH 31.2 26.0 - 34.0 pg  MCHC 33.2 30.0 - 36.0 g/dL   RDW 81.1 91.4 - 78.2 %   Platelets 185 150 - 400 K/uL  Group B strep by PCR   Collection Time: 02/23/18 12:09 PM  Result Value Ref Range   Group B strep by PCR POSITIVE (A) NEGATIVE    Patient Active Problem List   Diagnosis Date Noted  . Threatened preterm labor 02/23/2018  . Cocaine use complicating pregnancy 02/23/2018  . Smoker 12/18/2017  . Alcohol use affecting pregnancy, antepartum 12/18/2017  . Supervision of normal first pregnancy 08/23/2017  . HSV-2 infection   . History of illicit drug use 07/17/2017  . Anxiety disorder 12/04/2013    Assessment/Plan:  Angela Moran is a 27 y.o. G1P0000 at [redacted]w[redacted]d here for PTL.   Threatened PTL: not currently in active labor, recheck at 1:40pm unchanged from 2 hr prior  -- clear liquid diet  Fetal Wellbeing: Leopolds: 5-6lbs, vertex  -- continuous fetal monitoring - FHR baseline 120 with accels and no decels -- s/p betamethasone at 1221 today, 2nd dose tomorrow at noon  Active HSV -- Valtrex 1000mg  BID for 10 days  -- discussed with patient that because of her active lesions in the 3rd trimester, she will require a CS   Postpartum Planning -- boy (outpt)/breast/IUD -- received Tdap, has not had flu shot    Burman Nieves, MD Family Medicine Resident   Attestation: I have seen this patient and agree with the resident's documentation. I have examined them separately, and we have discussed the plan of care.  Cristal Deer. Earlene Plater, DO OB/GYN Fellow

## 2018-02-23 NOTE — Progress Notes (Signed)
Dr Erin Fulling notified of patient and her complaints at this time; 10 minutes of EFM show reactive tracing; patient to be transferred to MAU; ED staff notified of transfer order at this time

## 2018-02-23 NOTE — ED Provider Notes (Signed)
[facility] EMERGENCY DEPARTMENT Provider Note   CSN: [id number] Arrival date & time: 02/23/18  4098  Time seen 06:33 AM   History   Chief Complaint Chief Complaint  Patient presents with  . Vaginal Bleeding    [redacted] weeks pregnant    HPI Angela Moran is a 27 y.o. female.  HPI patient is G1, P0 Ab0, due date November 24, so she is approximately 36 weeks and 5 days.  She states she has had a normal pregnancy although she did have an episode of bleeding with intercourse about 2 to 3 weeks ago that resolved quickly.  She states about 6 AM this morning they were having sex and she started having bleeding like a.  And states the blood was "running down my leg".  She has some upper abdominal cramping discomfort that started after that.  She states she has been having Braxton Hicks contractions that are getting worse the past 2 weeks.`  PCP Waldon Merl, PA-C [facility]  Past Medical History:  Diagnosis Date  . Depression   . HSV-2 infection   . Pregnant   . Vaginal Pap smear, abnormal     Patient Active Problem List   Diagnosis Date Noted  . Smoker 12/18/2017  . Alcohol use affecting pregnancy, antepartum 12/18/2017  . Supervision of normal first pregnancy 08/23/2017  . HSV-2 infection   . History of illicit drug use 07/17/2017  . Anxiety disorder 12/04/2013    Past Surgical History:  Procedure Laterality Date  . NO PAST SURGERIES       OB History    Gravida  1   Para      Term      Preterm      AB      Living        SAB      TAB      Ectopic      Multiple      Live Births               Home Medications    Prior to Admission medications   Medication Sig Start Date End Date Taking? Authorizing Provider  acyclovir (ZOVIRAX) 400 MG tablet Take 1 tablet (400 mg total) by mouth 3 (three) times daily. 02/06/18   Lazaro Arms, MD  amoxicillin (AMOXIL) 875 MG tablet Take 1 tablet (875 mg total) by mouth 2 (two) times daily. Patient not  taking: Reported on 01/22/2018 12/28/17   Waldon Merl, PA-C  Prenatal Vit-Fe Fumarate-FA (MULTIVITAMIN-PRENATAL) 27-0.8 MG TABS tablet Take 1 tablet by mouth daily at 12 noon.    [provider]  UNABLE TO FIND OTC allergy med-prn    [provider]    Family History Family History  Problem Relation Age of Onset  . Other Mother        Bone deteriation  . Alcohol abuse Mother   . Cirrhosis Father   . Alcohol abuse Father   . Diabetes Brother   . Alcohol abuse Maternal Grandmother   . Alcohol abuse Maternal Grandfather   . Alcohol abuse Paternal Grandmother   . Alcohol abuse Paternal Grandfather   . Autism Brother   . ADD / ADHD Brother     Social History Social History   Tobacco Use  . Smoking status: Current Every Day Smoker    Packs/day: 0.25    Types: Cigarettes  . Smokeless tobacco: Never Used  . Tobacco comment: 6 cigs per day  Substance  Use Topics  . Alcohol use: Yes    Comment: 1 glass of wine per week  . Drug use: Not Currently    Types: Marijuana, Cocaine     Allergies   Patient has no known allergies.   Review of Systems Review of Systems  All other systems reviewed and are negative.    Physical Exam Updated Vital Signs BP (!) 117/92 (BP Location: Right Arm)   Pulse 92   Temp 97.6 F (36.4 C) (Oral)   Resp 16   Ht 5\' 6"  (1.676 m)   Wt 83 kg   LMP  (LMP Unknown)   SpO2 98%   BMI 29.54 kg/m   Vital signs normal    Physical Exam  Constitutional: She is oriented to person, place, and time. She appears well-developed and well-nourished.  Non-toxic appearance. She does not appear ill. No distress.  HENT:  Head: Normocephalic and atraumatic.  Right Ear: External ear normal.  Left Ear: External ear normal.  Nose: Nose normal. No mucosal edema or rhinorrhea.  Mouth/Throat: Oropharynx is clear and moist and mucous membranes are normal. No dental abscesses or uvula swelling.  Eyes: Pupils are equal, round, and reactive to  light. Conjunctivae and EOM are normal.  Neck: Normal range of motion and full passive range of motion without pain. Neck supple.  Cardiovascular: Normal rate, regular rhythm and normal heart sounds. Exam reveals no gallop and no friction rub.  No murmur heard. Pulmonary/Chest: Effort normal and breath sounds normal. No respiratory distress. She has no wheezes. She has no rhonchi. She has no rales. She exhibits no tenderness and no crepitus.  Abdominal: Soft. Normal appearance and bowel sounds are normal. She exhibits no distension. There is no tenderness. There is no rebound and no guarding.  Abdomen is consistent with dates, when I palpate her abdomen there is rare fetal movement.  Patient states she has not been feeling the baby move since this happened.  On the fetal monitor her heart rate is around 139.  Genitourinary:  Genitourinary Comments: When I inspect her groin there is no obvious bleeding from her vagina however when I open her labia there is some blood in the opening of the vagina.  Musculoskeletal: Normal range of motion. She exhibits no edema or tenderness.  Moves all extremities well.   Neurological: She is alert and oriented to person, place, and time. She has normal strength. No cranial nerve deficit.  Skin: Skin is warm, dry and intact. No rash noted. No erythema. No pallor.  Psychiatric: She has a normal mood and affect. Her speech is normal and behavior is normal. Her mood appears not anxious.  Nursing note and vitals reviewed.    ED Treatments / Results  Labs (all labs ordered are listed, but only abnormal results are displayed) Labs Reviewed - No data to display  EKG None  Radiology No results found.  Procedures Procedures (including critical care time)  Medications Ordered in ED Medications  0.9 %  sodium chloride infusion (has no administration in time range)     Initial Impression / Assessment and Plan / ED Course  I have reviewed the triage vital  signs and the nursing notes.  Pertinent labs & imaging results that were available during my care of the patient were reviewed by me and considered in my medical decision making (see chart for details).     I reviewed patient's prior ultrasounds, the last when I could find was done on July 2 when she was  [redacted] weeks pregnant.  I do not see anything since then to document the placement of her placenta.  Pelvic examination was not done for risk of placenta previa.  Pt was started on IV 0.9 NS IV fluids.  Patient was placed on the fetal monitor.  I spoke to Dr. Lowry Ram, OB on-call at [facility] at proximally 6:50 AM.  She [facility] but wants [place] to keep her on the fetal monitor to make sure the baby is okay at this point.  6:55 AM [facility] rapid response called back and said to go ahead and transport patient.  07:19 AM waiting for [facility] to transport patient.   07:34 AM [facility] has arrived to transport patient.   Final Clinical Impressions(s) / ED Diagnoses   Final diagnoses:  Vaginal bleeding in pregnancy    Plan transfer to [facility] for further evaluation   Devoria Albe, MD, Concha Pyo, MD 02/23/18 [id number]

## 2018-02-23 NOTE — ED Notes (Signed)
Pt denies contractions at this time. No bleeding noted at this time.

## 2018-02-23 NOTE — MAU Note (Signed)
Started bleeding during intercourse, "was a lot of blood". No associated pain.  Hx of bleeding during intercourse x1 during preg, was minimal amt.  Denies placental issues on [place].

## 2018-02-23 NOTE — Progress Notes (Signed)
[facility] called about patient who presents to [facility] ED with complaints of vaginal bleeding that started when she was having sex; patient stated to ED staff that she feels some BH contractions that she has been feeling for several weeks now; patient is a G1P0 at 40 and 5/[redacted] weeks along in her pregnancy at this time; EFM applied by ED staff and will assess remotely at this time

## 2018-02-23 NOTE — ED Notes (Signed)
Spoke with OB rapid response. They will monitor patient for 20 minutes and call with an update at that time.

## 2018-02-23 NOTE — ED Notes (Signed)
Report given to [facility] at this time. Pt resting with slight increase in "contraction feeling"

## 2018-02-23 NOTE — MAU Note (Signed)
Urine in lab 

## 2018-02-24 DIAGNOSIS — N930 Postcoital and contact bleeding: Secondary | ICD-10-CM | POA: Diagnosis present

## 2018-02-24 LAB — RPR: RPR Ser Ql: NONREACTIVE

## 2018-02-24 NOTE — Progress Notes (Signed)
FACULTY PRACTICE ANTEPARTUM COMPREHENSIVE PROGRESS NOTE  Angela Moran is a 27 y.o. G1P0000 at [redacted]w[redacted]d who is admitted for threatened preterm labor.  Estimated Date of Delivery: 03/18/18 Fetal presentation is unsure.  Length of Stay:  1 Days. Admitted 02/23/2018  Subjective: Reports continued cramping and dark, bloody discharge. Patient reports good fetal movement.  She reports crampy uterine contractions, no bleeding and no loss of fluid per vagina.  Vitals:  Blood pressure 105/72, pulse 78, temperature 98 F (36.7 C), temperature source Oral, resp. rate 18, height 5\' 6"  (1.676 m), weight 83 kg, SpO2 99 %. Physical Examination: CONSTITUTIONAL: Well-developed, well-nourished female in no acute distress.  HENT:  Normocephalic, atraumatic, External right and left ear normal. Oropharynx is clear and moist EYES: Conjunctivae and EOM are normal. Pupils are equal, round, and reactive to light. No scleral icterus.  NECK: Normal range of motion, supple, no masses SKIN: Skin is warm and dry. No rash noted. Not diaphoretic. No erythema. No pallor. NEUROLGIC: Alert and oriented to person, place, and time. Normal reflexes, muscle tone coordination. No cranial nerve deficit noted. PSYCHIATRIC: Normal mood and affect. Normal behavior. Normal judgment and thought content. CARDIOVASCULAR: Normal heart rate noted, regular rhythm RESPIRATORY: Effort and breath sounds normal, no problems with respiration noted MUSCULOSKELETAL: Normal range of motion. No edema and no tenderness. 2+ distal pulses. ABDOMEN: Soft, nontender, nondistended, gravid. CERVIX: Dilation: 3 Effacement (%): 80 Cervical Position: Middle Station: -2 Presentation: Vertex Exam by:: Dr. Jaynie Collins Small amount of dark brown discharge, no red bleeding  Fetal monitoring: FHR: 130 bpm, Variability: moderate, Accelerations: Present, Decelerations: Absent  Uterine activity: Cramping  Results for orders placed or performed during the  hospital encounter of 02/23/18 (from the past 48 hour(s))  Urine rapid drug screen (hosp performed)     Status: None   Collection Time: 02/23/18  8:33 AM  Result Value Ref Range   Opiates NONE DETECTED NONE DETECTED   Cocaine NONE DETECTED NONE DETECTED   Benzodiazepines NONE DETECTED NONE DETECTED   Amphetamines NONE DETECTED NONE DETECTED   Tetrahydrocannabinol NONE DETECTED NONE DETECTED   Barbiturates NONE DETECTED NONE DETECTED    Comment: (NOTE) DRUG SCREEN FOR MEDICAL PURPOSES ONLY.  IF CONFIRMATION IS NEEDED FOR ANY PURPOSE, NOTIFY LAB WITHIN 5 DAYS. LOWEST DETECTABLE LIMITS FOR URINE DRUG SCREEN Drug Class                     Cutoff (ng/mL) Amphetamine and metabolites    1000 Barbiturate and metabolites    200 Benzodiazepine                 200 Tricyclics and metabolites     300 Opiates and metabolites        300 Cocaine and metabolites        300 THC                            50 Performed at [facility], [address], [place], [place] [address]   Type and screen [facility]     Status: None   Collection Time: 02/23/18  9:43 AM  Result Value Ref Range   ABO/RH(D) A POS    Antibody Screen NEG    Sample Expiration      02/26/2018 Performed at [facility], [address], [place], [place] [address]   RPR     Status: None   Collection Time: 02/23/18  9:43  AM  Result Value Ref Range   RPR Ser Ql Non Reactive Non Reactive    Comment: (NOTE) Performed At: [facility] [address] [place], [place] [id number] Jolene Schimke MD WU:[id number]   ABO/Rh     Status: None   Collection Time: 02/23/18  9:43 AM  Result Value Ref Range   ABO/RH(D)      A POS Performed at [facility], [address] [place], [place] [address]   CBC     Status: None   Collection Time: 02/23/18  9:44 AM  Result Value Ref Range   WBC 10.4 4.0 - 10.5 K/uL   RBC 4.10 3.87 - 5.11 MIL/uL   Hemoglobin 12.8 12.0 - 15.0 g/dL   HCT 30.8 65.7 - 84.6  %   MCV 94.1 80.0 - 100.0 fL   MCH 31.2 26.0 - 34.0 pg   MCHC 33.2 30.0 - 36.0 g/dL   RDW 96.2 95.2 - 84.1 %   Platelets 185 150 - 400 K/uL    Comment: Performed at [facility], [address], [place], [place] [address]  Group B strep by PCR     Status: Abnormal   Collection Time: 02/23/18 12:09 PM  Result Value Ref Range   Group B strep by PCR POSITIVE (A) NEGATIVE    Comment: CRITICAL RESULT CALLED TO, READ BACK BY AND VERIFIED WITH: GAGNON R AT 1319 ON 102725 BY FORSYTH K (NOTE) Intrapartum testing with Xpert GBS assay should be used as an adjunct to other methods available and not used to replace antepartum testing (at 35-[redacted] weeks gestation). Performed at [facility], [address], [place], [place] [address]     [place] Mfm Ob Limited  Result Date: 02/23/2018 ----------------------------------------------------------------------  OBSTETRICS REPORT                       (Signed Final 02/23/2018 10:51 am) ---------------------------------------------------------------------- Patient Info  ID #:       [id number]                          D.O.B.:  [birth year] (27 yrs)  Name:       Angela Moran                Visit Date: 02/23/2018 10:27 am ---------------------------------------------------------------------- Performed By  Performed By:     Lenise Arena        Ref. Address:     Faculty Practice                    RDMS  Attending:        Noralee Space MD        Location:         [facility]  Referred By:      Currie Paris NP ---------------------------------------------------------------------- Orders   #  Description                          Code         Ordered By   1  [place] MFM OB LIMITED                    63875.64     Nolene Bernheim  ----------------------------------------------------------------------   #  Order #  Accession #                 Episode #   1  [id number]                  [id number]                  [id number]   ---------------------------------------------------------------------- Indications   [redacted] weeks gestation of pregnancy                Z3A.36   Herpes simplex virus (HSV)                     O98.519 B00.9   Other mental disorder complicating             O99.340   pregnancy, third trimester (depression - no   meds)   Drug use complicating pregnancy, third         O99.323   trimester (heroin/cocaine use)   Alcohol use complicating pregnancy, third      O99.313   trimester (1-2x/week - wine or beer)   Tobacco use complicating pregnancy, third      O99.333   trimester (smokes 6-8 cigs/day)  ---------------------------------------------------------------------- Vital Signs  Weight (lb): 183                               Height:        5'6"  BMI:         29.53 ---------------------------------------------------------------------- Fetal Evaluation  Num Of Fetuses:         1  Fetal Heart Rate(bpm):  144  Cardiac Activity:       Observed  Presentation:           Cephalic  Placenta:               Anterior  Amniotic Fluid  AFI FV:      Within normal limits  AFI Sum(cm)     %Tile       Largest Pocket(cm)  12.09           39          4.69  RUQ(cm)       RLQ(cm)       LUQ(cm)        LLQ(cm)  4.69          0             2.88           4.52  Comment:    No placental abruption or previa identified. ---------------------------------------------------------------------- OB History  Gravidity:    1 ---------------------------------------------------------------------- Gestational Age  Best:          36w 5d     Det. By:  Previous Ultrasound      EDD:   03/18/18                                      (08/09/17) ---------------------------------------------------------------------- Anatomy  Thoracic:              Appears normal         Stomach:                Appears normal, left  sided  Heart:                 Appears normal         Bladder:                Appears normal                          (4CH, axis, and situs ---------------------------------------------------------------------- Cervix Uterus Adnexa  Cervix  Not visualized (advanced GA >24wks) ---------------------------------------------------------------------- Impression  Patient is being evaluated at the MAU for c/o vaginal  bleeding. She still has mild vaginal bleeding and minimal  uterine contractions.  A limited ultrasound was performed. Amniotic fluid is normal  and good fetal activity is seen. Placenta is anterior and there  is no abruption. Ultrasound has limitations in diagnosing  abruption.  Patient admits to using cocaine last week (UTOX is negative).  I informed her cocaine use is associated with maternal  hypertension and placental abruption. ---------------------------------------------------------------------- Recommendations  -Recommend inpatient observation for 24 to 48 hours.  -Delivery at 37 weeks.  -If patient has persistent vaginal bleeding, consider delivery. ----------------------------------------------------------------------                  Noralee Space, MD Electronically Signed Final Report   02/23/2018 10:51 am ----------------------------------------------------------------------   Current scheduled medications . betamethasone acetate-betamethasone sodium phosphate  12 mg Intramuscular Q24 Hr x 2  . docusate sodium  100 mg Oral Daily  . oxytocin 40 units in LR 1000 mL  500 mL Intravenous Once  . prenatal multivitamin  1 tablet Oral Q1200  . valACYclovir  1,000 mg Oral BID    I have reviewed the patient's current medications.  ASSESSMENT: Principal Problem:   Threatened preterm labor Active Problems:   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)   Active genital herpes infection in third trimester   PCB (post coital bleeding)   PLAN: Second dose of BMZ today Continue to monitor bleeding; concerned about abruption given h/o cocaine Continue Valtrex therapy,  cesarean section indicated for delivery Possible discharge to home tomorrow if stable  Continue routine antenatal care.   Jaynie Collins, MD, FACOG Obstetrician & Gynecologist, [facility] for [employer], [facility]

## 2018-02-25 MED ORDER — VALACYCLOVIR HCL 1 G PO TABS: 1000.0000 mg | ORAL_TABLET | Freq: Two times a day (BID) | ORAL | 0 refills | Status: DC

## 2018-02-25 NOTE — Discharge Summary (Signed)
Antenatal Physician Discharge Summary  Patient ID: Angela Moran MRN: [id number] DOB/AGE: 28-Sep-1990 28 y.o.  Admit date: 02/23/2018 Discharge date: 02/25/2018  Admission Diagnoses: IUP 33 weeks, Threaten PTL, Vaginal Bleeding, HSV infection  Discharge Diagnoses: SAA, PTL resolved, undelivered  Prenatal Procedures: none  Consults: NA  Significant Diagnostic Studies:  Results for orders placed or performed during the hospital encounter of 02/23/18 (from the past 168 hour(s))  Urine rapid drug screen (hosp performed)   Collection Time: 02/23/18  8:33 AM  Result Value Ref Range   Opiates NONE DETECTED NONE DETECTED   Cocaine NONE DETECTED NONE DETECTED   Benzodiazepines NONE DETECTED NONE DETECTED   Amphetamines NONE DETECTED NONE DETECTED   Tetrahydrocannabinol NONE DETECTED NONE DETECTED   Barbiturates NONE DETECTED NONE DETECTED  RPR   Collection Time: 02/23/18  9:43 AM  Result Value Ref Range   RPR Ser Ql Non Reactive Non Reactive  Type and screen [facility] [place]   Collection Time: 02/23/18  9:43 AM  Result Value Ref Range   ABO/RH(D) A POS    Antibody Screen NEG    Sample Expiration      02/26/2018 Performed at [facility], [address], [place], [place] [address]   ABO/Rh   Collection Time: 02/23/18  9:43 AM  Result Value Ref Range   ABO/RH(D)      A POS Performed at [facility], [address], [place], [place] [address]   CBC   Collection Time: 02/23/18  9:44 AM  Result Value Ref Range   WBC 10.4 4.0 - 10.5 K/uL   RBC 4.10 3.87 - 5.11 MIL/uL   Hemoglobin 12.8 12.0 - 15.0 g/dL   HCT 82.9 56.2 - 13.0 %   MCV 94.1 80.0 - 100.0 fL   MCH 31.2 26.0 - 34.0 pg   MCHC 33.2 30.0 - 36.0 g/dL   RDW 86.5 78.4 - 69.6 %   Platelets 185 150 - 400 K/uL  Group B strep by PCR   Collection Time: 02/23/18 12:09 PM  Result Value Ref Range   Group B strep by PCR POSITIVE (A) NEGATIVE    Treatments: IV hydration and steroids: Celestone X 2,  Valtrex  Hospital Course:  This is a 27 y.o. G1P0000 with IUP at [redacted]w[redacted]d admitted for above noted Dx. Vaginal bleeding appeared to secondary to recent IC. U/S no evidence of placental abruption but with H/O drug use and cervical change, pt was observed. Vaginal bleeding and PTL resolved. Pt was started on Valtrex d/t recent HSV outbreak. Received BMZ x 2.  She was observed, fetal heart rate monitoring remained reassuring, and she had no signs/symptoms of progressing preterm labor or other maternal-fetal concerns.  Her cervical exam was unchanged from admission.  She was deemed stable for discharge to home with outpatient follow up.  Discharge Exam: BP 113/73 (BP Location: Left Arm)   Pulse 72   Temp 98 F (36.7 C) (Oral)   Resp 18   Ht 5\' 6"  (1.676 m)   Wt 83 kg   LMP  (LMP Unknown)   SpO2 99%   BMI 29.54 kg/m  Lungs clear Heart RRR Abd soft + BS gravid non tender SVE 3/80/-2 Ext non tender   Discharge Condition: Stable  Disposition: Discharge disposition: 01-Home or Self Care       Discharge Instructions    Discharge activity:  No Restrictions   Complete by:  As directed    Discharge diet:  No restrictions   Complete by:  As directed    Do not have sex or do anything that might make you have an orgasm   Complete by:  As directed    Fetal Kick Count:  Lie on our left side for one hour after a meal, and count the number of times your baby kicks.  If it is less than 5 times, get up, move around and drink some juice.  Repeat the test 30 minutes later.  If it is still less than 5 kicks in an hour, notify your doctor.   Complete by:  As directed    LABOR:  When conractions begin, you should start to time them from the beginning of one contraction to the beginning  of the next.  When contractions are 5 - 10 minutes apart or less and have been regular for at least an hour, you should call your health care provider.   Complete by:  As directed    Notify physician for bleeding from  the vagina   Complete by:  As directed    Notify physician for blurring of vision or spots before the eyes   Complete by:  As directed    Notify physician for chills or fever   Complete by:  As directed    Notify physician for fainting spells, "black outs" or loss of consciousness   Complete by:  As directed    Notify physician for increase in vaginal discharge   Complete by:  As directed    Notify physician for leaking of fluid   Complete by:  As directed    Notify physician for pain or burning when urinating   Complete by:  As directed    Notify physician for pelvic pressure (sudden increase)   Complete by:  As directed    Notify physician for severe or continued nausea or vomiting   Complete by:  As directed    Notify physician for sudden gushing of fluid from the vagina (with or without continued leaking)   Complete by:  As directed    Notify physician for sudden, constant, or occasional abdominal pain   Complete by:  As directed    Notify physician if baby moving less than usual   Complete by:  As directed      Allergies as of 02/25/2018   No Known Allergies     Medication List    STOP taking these medications   acyclovir 400 MG tablet Commonly known as:  ZOVIRAX     TAKE these medications   loratadine 10 MG tablet Commonly known as:  CLARITIN Take 10 mg by mouth daily as needed for allergies.   multivitamin-prenatal 27-0.8 MG Tabs tablet Take 1 tablet by mouth daily at 12 noon.   valACYclovir 1000 MG tablet Commonly known as:  VALTREX Take 1 tablet (1,000 mg total) by mouth 2 (two) times daily.      Follow-up Information    [facility] Follow up.   Specialty:  Obstetrics and Gynecology Why:  Pt has appt tomorrow 02/26/18 Contact information: [address] [place] [address] [phone]          Signed: Hermina Staggers M.D. 02/25/2018, 9:58 AM

## 2018-02-25 NOTE — Progress Notes (Signed)
Pt discharged with printed instructions. Pt verbalized an understanding. No concerns noted. Maryfrances Portugal L [facility], RN 

## 2018-02-25 NOTE — Discharge Instructions (Signed)
Braxton Hicks Contractions °Contractions of the uterus can occur throughout pregnancy, but they are not always a sign that you are in labor. You may have practice contractions called Braxton Hicks contractions. These false labor contractions are sometimes confused with true labor. °What are Braxton Hicks contractions? °Braxton Hicks contractions are tightening movements that occur in the muscles of the uterus before labor. Unlike true labor contractions, these contractions do not result in opening (dilation) and thinning of the cervix. Toward the end of pregnancy (32-34 weeks), Braxton Hicks contractions can happen more often and may become stronger. These contractions are sometimes difficult to tell apart from true labor because they can be very uncomfortable. You should not feel embarrassed if you go to the hospital with false labor. °Sometimes, the only way to tell if you are in true labor is for your health care provider to look for changes in the cervix. The health care provider will do a physical exam and may monitor your contractions. If you are not in true labor, the exam should show that your cervix is not dilating and your water has not broken. °If there are other health problems associated with your pregnancy, it is completely safe for you to be sent home with false labor. You may continue to have Braxton Hicks contractions until you go into true labor. °How to tell the difference between true labor and false labor °True labor °· Contractions last 30-70 seconds. °· Contractions become very regular. °· Discomfort is usually felt in the top of the uterus, and it spreads to the lower abdomen and low back. °· Contractions do not go away with walking. °· Contractions usually become more intense and increase in frequency. °· The cervix dilates and gets thinner. °False labor °· Contractions are usually shorter and not as strong as true labor contractions. °· Contractions are usually irregular. °· Contractions  are often felt in the front of the lower abdomen and in the groin. °· Contractions may go away when you walk around or change positions while lying down. °· Contractions get weaker and are shorter-lasting as time goes on. °· The cervix usually does not dilate or become thin. °Follow these instructions at home: °· Take over-the-counter and prescription medicines only as told by your health care provider. °· Keep up with your usual exercises and follow other instructions from your health care provider. °· Eat and drink lightly if you think you are going into labor. °· If Braxton Hicks contractions are making you uncomfortable: °? Change your position from lying down or resting to walking, or change from walking to resting. °? Sit and rest in a tub of warm water. °? Drink enough fluid to keep your urine pale yellow. Dehydration may cause these contractions. °? Do slow and deep breathing several times an hour. °· Keep all follow-up prenatal visits as told by your health care provider. This is important. °Contact a health care provider if: °· You have a fever. °· You have continuous pain in your abdomen. °Get help right away if: °· Your contractions become stronger, more regular, and closer together. °· You have fluid leaking or gushing from your vagina. °· You pass blood-tinged mucus (bloody show). °· You have bleeding from your vagina. °· You have low back pain that you never had before. °· You feel your baby’s head pushing down and causing pelvic pressure. °· Your baby is not moving inside you as much as it used to. °Summary °· Contractions that occur before labor are called Braxton   Hicks contractions, false labor, or practice contractions. °· Braxton Hicks contractions are usually shorter, weaker, farther apart, and less regular than true labor contractions. True labor contractions usually become progressively stronger and regular and they become more frequent. °· Manage discomfort from Braxton Hicks contractions by  changing position, resting in a warm bath, drinking plenty of water, or practicing deep breathing. °This information is not intended to replace advice given to you by your health care provider. Make sure you discuss any questions you have with your health care provider. °Document Released: 08/25/2016 Document Revised: 08/25/2016 Document Reviewed: 08/25/2016 °Elsevier Interactive Patient Education © 2018 [employer] ° °

## 2018-02-26 ENCOUNTER — Ambulatory Visit (INDEPENDENT_AMBULATORY_CARE_PROVIDER_SITE_OTHER): Payer: BLUE CROSS/BLUE SHIELD | Admitting: Women's Health

## 2018-02-26 ENCOUNTER — Encounter: Payer: Self-pay | Admitting: Women's Health

## 2018-02-26 VITALS — BP 122/79 | HR 81 | Wt 186.0 lb

## 2018-02-26 DIAGNOSIS — O9852 Other viral diseases complicating childbirth: Secondary | ICD-10-CM

## 2018-02-26 DIAGNOSIS — Z3A37 37 weeks gestation of pregnancy: Secondary | ICD-10-CM

## 2018-02-26 DIAGNOSIS — O99323 Drug use complicating pregnancy, third trimester: Secondary | ICD-10-CM | POA: Diagnosis not present

## 2018-02-26 DIAGNOSIS — Z1389 Encounter for screening for other disorder: Secondary | ICD-10-CM

## 2018-02-26 DIAGNOSIS — O26843 Uterine size-date discrepancy, third trimester: Secondary | ICD-10-CM

## 2018-02-26 DIAGNOSIS — Z331 Pregnant state, incidental: Secondary | ICD-10-CM

## 2018-02-26 DIAGNOSIS — Z3403 Encounter for supervision of normal first pregnancy, third trimester: Secondary | ICD-10-CM | POA: Diagnosis not present

## 2018-02-26 LAB — POCT URINALYSIS DIPSTICK OB
Glucose, UA: NEGATIVE
KETONES UA: NEGATIVE
LEUKOCYTES UA: NEGATIVE
Nitrite, UA: NEGATIVE
PROTEIN: NEGATIVE
RBC UA: NEGATIVE

## 2018-02-26 NOTE — Progress Notes (Signed)
LOW-RISK PREGNANCY VISIT Patient name: Angela Moran MRN [id number]  Date of birth: 1990-10-12 Chief Complaint:   Routine Prenatal Visit  History of Present Illness:   Angela Moran is a 27 y.o. G53P0000 female at [redacted]w[redacted]d with an Estimated Date of Delivery: 03/18/18 being seen today for ongoing management of a low-risk pregnancy.  Today she reports she was d/c'd from [facility] yesterday morning after 2d stay for threatened PTL, active HSV outbreak- was notified she will need PLTCS @ 39wks- does not want it on 11/17 d/t personal reasons. She was 3cm on d/c. Received bmz x 2. And is on valtrex for HSV outbreak. Pregnancy complicated by cocaine and ETOH use. Contractions: Irregular. Vag. Bleeding: None.  Movement: Present. denies leaking of fluid. Review of Systems:   Pertinent items are noted in HPI Denies abnormal vaginal discharge w/ itching/odor/irritation, headaches, visual changes, shortness of breath, chest pain, abdominal pain, severe nausea/vomiting, or problems with urination or bowel movements unless otherwise stated above. Pertinent History Reviewed:  Reviewed past medical,surgical, social, obstetrical and family history.  Reviewed problem list, medications and allergies. Physical Assessment:   Vitals:   02/26/18 0850  BP: 122/79  Pulse: 81  Weight: 186 lb (84.4 kg)  Body mass index is 30.02 kg/m.        Physical Examination:   General appearance: Well appearing, and in no distress  Mental status: Alert, oriented to person, place, and time  Skin: Warm & dry  Cardiovascular: Normal heart rate noted  Respiratory: Normal respiratory effort, no distress  Abdomen: Soft, gravid, nontender  Pelvic: Cervical exam performed  Dilation: 3 Effacement (%): 90 Station: -1  Extremities: Edema: None  Fetal Status: Fetal Heart Rate (bpm): 155 Fundal Height: 34 cm Movement: Present Presentation: Vertex  Results for orders placed or performed in visit on 02/26/18 (from the past 24 hour(s))    POC Urinalysis Dipstick OB   Collection Time: 02/26/18  8:52 AM  Result Value Ref Range   Color, UA     Clarity, UA     Glucose, UA Negative Negative   Bilirubin, UA     Ketones, UA neg    Spec Grav, UA     Blood, UA neg    pH, UA     POC Protein UA Negative Negative, Trace   Urobilinogen, UA     Nitrite, UA neg    Leukocytes, UA Negative Negative   Appearance     Odor      Assessment & Plan:  1) Low-risk pregnancy G1P0000 at [redacted]w[redacted]d with an Estimated Date of Delivery: 03/18/18   2) Uterine size <dates, will get efw/afi u/s  3) Cocaine & ETOH use during pregnancy> uds today  4) Active HSV> needs c/s scheduled for 39wks, note sent to [facility] to schedule, pt does not want 11/17 for personal reasons. Continue valtrex   Meds: No orders of the defined types were placed in this encounter.  Labs/procedures today: gbs, gc/ct, sve  Plan:  Continue routine obstetrical care   Reviewed: Term labor symptoms and general obstetric precautions including but not limited to vaginal bleeding, contractions, leaking of fluid and fetal movement were reviewed in detail with the patient.  All questions were answered  Follow-up: Return for asap for efw/afi u/s (no visit), then 1wk for LROB w/ [facility].  Orders Placed This Encounter  Procedures  . GC/Chlamydia Probe Amp  . Culture, beta strep (group b only)  . US OB Follow Up  . Pain Management Screening Profile (10S)  .  POC Urinalysis Dipstick OB   Cheral Marker CNM, [facility] 02/26/2018 10:15 AM

## 2018-02-26 NOTE — Patient Instructions (Signed)
Angela Moran, I greatly value your feedback.  If you receive a survey following your visit with [place] today, we appreciate you taking the time to fill it out.  Thanks, Joellyn Haff, CNM, WHNP-BC   Call the office [phone]) or go to [facility] if:  You begin to have strong, frequent contractions  Your water breaks.  Sometimes it is a big gush of fluid, sometimes it is just a trickle that keeps getting your panties wet or running down your legs  You have vaginal bleeding.  It is normal to have a small amount of spotting if your cervix was checked.   You don't feel your baby moving like normal.  If you don't, get you something to eat and drink and lay down and focus on feeling your baby move.  You should feel at least 10 movements in 2 hours.  If you don't, you should call the office or go to [facility]     [facility] Contractions Contractions of the uterus can occur throughout pregnancy, but they are not always a sign that you are in labor. You may have practice contractions called Braxton Hicks contractions. These false labor contractions are sometimes confused with true labor. What are Deberah Pelton contractions? Braxton Hicks contractions are tightening movements that occur in the muscles of the uterus before labor. Unlike true labor contractions, these contractions do not result in opening (dilation) and thinning of the cervix. Toward the end of pregnancy (32-34 weeks), Braxton Hicks contractions can happen more often and may become stronger. These contractions are sometimes difficult to tell apart from true labor because they can be very uncomfortable. You should not feel embarrassed if you go to the hospital with false labor. Sometimes, the only way to tell if you are in true labor is for your health care provider to look for changes in the cervix. The health care provider will do a physical exam and may monitor your contractions. If you are not in true labor, the exam should  show that your cervix is not dilating and your water has not broken. If there are other health problems associated with your pregnancy, it is completely safe for you to be sent home with false labor. You may continue to have Braxton Hicks contractions until you go into true labor. How to tell the difference between true labor and false labor True labor  Contractions last 30-70 seconds.  Contractions become very regular.  Discomfort is usually felt in the top of the uterus, and it spreads to the lower abdomen and low back.  Contractions do not go away with walking.  Contractions usually become more intense and increase in frequency.  The cervix dilates and gets thinner. False labor  Contractions are usually shorter and not as strong as true labor contractions.  Contractions are usually irregular.  Contractions are often felt in the front of the lower abdomen and in the groin.  Contractions may go away when you walk around or change positions while lying down.  Contractions get weaker and are shorter-lasting as time goes on.  The cervix usually does not dilate or become thin. Follow these instructions at home:  Take over-the-counter and prescription medicines only as told by your health care provider.  Keep up with your usual exercises and follow other instructions from your health care provider.  Eat and drink lightly if you think you are going into labor.  If Braxton Hicks contractions are making you uncomfortable: ? Change your position from lying  down or resting to walking, or change from walking to resting. ? Sit and rest in a tub of warm water. ? Drink enough fluid to keep your urine pale yellow. Dehydration may cause these contractions. ? Do slow and deep breathing several times an hour.  Keep all follow-up prenatal visits as told by your health care provider. This is important. Contact a health care provider if:  You have a fever.  You have continuous pain in  your abdomen. Get help right away if:  Your contractions become stronger, more regular, and closer together.  You have fluid leaking or gushing from your vagina.  You pass blood-tinged mucus (bloody show).  You have bleeding from your vagina.  You have low back pain that you never had before.  You feel your baby's head pushing down and causing pelvic pressure.  Your baby is not moving inside you as much as it used to. Summary  Contractions that occur before labor are called Braxton Hicks contractions, false labor, or practice contractions.  Braxton Hicks contractions are usually shorter, weaker, farther apart, and less regular than true labor contractions. True labor contractions usually become progressively stronger and regular and they become more frequent.  Manage discomfort from [facility] contractions by changing position, resting in a warm bath, drinking plenty of water, or practicing deep breathing. This information is not intended to replace advice given to you by your health care provider. Make sure you discuss any questions you have with your health care provider. Document Released: 08/25/2016 Document Revised: 08/25/2016 Document Reviewed: 08/25/2016 Elsevier Interactive Patient Education  2018 [employer].

## 2018-02-27 LAB — PMP SCREEN PROFILE (10S), URINE
Amphetamine Scrn, Ur: NEGATIVE ng/mL
BARBITURATE SCREEN URINE: NEGATIVE ng/mL
BENZODIAZEPINE SCREEN, URINE: NEGATIVE ng/mL
CANNABINOIDS UR QL SCN: NEGATIVE ng/mL
Cocaine (Metab) Scrn, Ur: NEGATIVE ng/mL
Creatinine(Crt), U: 32.5 mg/dL (ref 20.0–300.0)
Methadone Screen, Urine: NEGATIVE ng/mL
OXYCODONE+OXYMORPHONE UR QL SCN: NEGATIVE ng/mL
Opiate Scrn, Ur: NEGATIVE ng/mL
PHENCYCLIDINE QUANTITATIVE URINE: NEGATIVE ng/mL
Ph of Urine: 7.4 (ref 4.5–8.9)
Propoxyphene Scrn, Ur: NEGATIVE ng/mL

## 2018-02-27 LAB — MED LIST OPTION NOT SELECTED

## 2018-02-28 LAB — GC/CHLAMYDIA PROBE AMP
Chlamydia trachomatis, NAA: NEGATIVE
Neisseria gonorrhoeae by PCR: NEGATIVE

## 2018-03-01 ENCOUNTER — Telehealth (HOSPITAL_COMMUNITY): Payer: Self-pay | Admitting: *Deleted

## 2018-03-01 LAB — CULTURE, BETA STREP (GROUP B ONLY): Strep Gp B Culture: POSITIVE — AB

## 2018-03-01 NOTE — Telephone Encounter (Signed)
Preadmission screen  

## 2018-03-02 ENCOUNTER — Ambulatory Visit (INDEPENDENT_AMBULATORY_CARE_PROVIDER_SITE_OTHER): Payer: BLUE CROSS/BLUE SHIELD

## 2018-03-02 DIAGNOSIS — Z3403 Encounter for supervision of normal first pregnancy, third trimester: Secondary | ICD-10-CM

## 2018-03-02 DIAGNOSIS — O99323 Drug use complicating pregnancy, third trimester: Secondary | ICD-10-CM | POA: Diagnosis not present

## 2018-03-02 DIAGNOSIS — O26843 Uterine size-date discrepancy, third trimester: Secondary | ICD-10-CM

## 2018-03-02 DIAGNOSIS — N930 Postcoital and contact bleeding: Secondary | ICD-10-CM

## 2018-03-02 NOTE — Progress Notes (Signed)
[place] 37+5 wks,cephalic,fhr 130 bpm,afi 17 cm,anterior placenta gr 3,efw 3220 g 54%,limited view of right kidney because of fetal position

## 2018-03-05 ENCOUNTER — Encounter: Payer: Self-pay | Admitting: Obstetrics & Gynecology

## 2018-03-05 ENCOUNTER — Other Ambulatory Visit: Payer: Self-pay

## 2018-03-05 ENCOUNTER — Telehealth: Payer: Self-pay | Admitting: Obstetrics & Gynecology

## 2018-03-05 ENCOUNTER — Ambulatory Visit (INDEPENDENT_AMBULATORY_CARE_PROVIDER_SITE_OTHER): Payer: BLUE CROSS/BLUE SHIELD | Admitting: Obstetrics & Gynecology

## 2018-03-05 VITALS — BP 114/71 | HR 96 | Wt 184.0 lb

## 2018-03-05 DIAGNOSIS — Z3A38 38 weeks gestation of pregnancy: Secondary | ICD-10-CM | POA: Diagnosis not present

## 2018-03-05 DIAGNOSIS — Z3403 Encounter for supervision of normal first pregnancy, third trimester: Secondary | ICD-10-CM

## 2018-03-05 DIAGNOSIS — Z1389 Encounter for screening for other disorder: Secondary | ICD-10-CM

## 2018-03-05 DIAGNOSIS — Z331 Pregnant state, incidental: Secondary | ICD-10-CM

## 2018-03-05 LAB — POCT URINALYSIS DIPSTICK OB
GLUCOSE, UA: NEGATIVE
Ketones, UA: NEGATIVE
Leukocytes, UA: NEGATIVE
Nitrite, UA: NEGATIVE
POC,PROTEIN,UA: NEGATIVE
RBC UA: NEGATIVE

## 2018-03-05 NOTE — Telephone Encounter (Signed)
Patient called stating that she would like to speak with Dr. Despina Hidden. Pt did not state the reason for the call. Pt did have an appointment earlier today. Please contact pt

## 2018-03-05 NOTE — Telephone Encounter (Signed)
Pt called stating that she noticed some blood when wiping after using the bathroom. Advised pt that some spotting can be normal after having a cervical check, which she had earlier today. Advised to call back for large gush of fluid, increased vaginal bleeding or decreased fetal movement. Pt verbalized understanding .

## 2018-03-05 NOTE — Progress Notes (Signed)
   LOW-RISK PREGNANCY VISIT Patient name: Angela Moran MRN [id number]  Date of birth: 04-29-1990 Chief Complaint:   Routine Prenatal Visit  History of Present Illness:   Angela Moran is a 27 y.o. G17P0000 female at [redacted]w[redacted]d with an Estimated Date of Delivery: 03/18/18 being seen today for ongoing management of a low-risk pregnancy.  Today she reports no complaints. Contractions: Irregular. Vag. Bleeding: None.  Movement: Present. denies leaking of fluid. Review of Systems:   Pertinent items are noted in HPI Denies abnormal vaginal discharge w/ itching/odor/irritation, headaches, visual changes, shortness of breath, chest pain, abdominal pain, severe nausea/vomiting, or problems with urination or bowel movements unless otherwise stated above. Pertinent History Reviewed:  Reviewed past medical,surgical, social, obstetrical and family history.  Reviewed problem list, medications and allergies. Physical Assessment:   Vitals:   03/05/18 1127  BP: 114/71  Pulse: 96  Weight: 184 lb (83.5 kg)  Body mass index is 29.7 kg/m.        Physical Examination:   General appearance: Well appearing, and in no distress  Mental status: Alert, oriented to person, place, and time  Skin: Warm & dry  Cardiovascular: Normal heart rate noted  Respiratory: Normal respiratory effort, no distress  Abdomen: Soft, gravid, nontender  Pelvic: Cervical exam performed       4/80/-2  Extremities: Edema: None  Fetal Status:     Movement: Present    Results for orders placed or performed in visit on 03/05/18 (from the past 24 hour(s))  POC Urinalysis Dipstick OB   Collection Time: 03/05/18 11:27 AM  Result Value Ref Range   Color, UA     Clarity, UA     Glucose, UA Negative Negative   Bilirubin, UA     Ketones, UA neg    Spec Grav, UA     Blood, UA neg    pH, UA     POC,PROTEIN,UA Negative Negative, Trace   Urobilinogen, UA     Nitrite, UA neg    Leukocytes, UA Negative Negative   Appearance     Odor       Assessment & Plan:  1) Low-risk pregnancy G1P0000 at [redacted]w[redacted]d with an Estimated Date of Delivery: 03/18/18   2) Primary C section scheduled due to 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  Labs/procedures today:   Plan:  Continue routine obstetrical care   Reviewed: Term labor symptoms and general obstetric precautions including but not limited to vaginal bleeding, contractions, leaking of fluid and fetal movement were reviewed in detail with the patient.  All questions were answered  Follow-up: Return in about 15 days (around 03/20/2018) for Post Op.  Orders Placed This Encounter  Procedures  . POC Urinalysis Dipstick OB   Angela Moran  03/05/2018 11:50 AM

## 2018-03-05 NOTE — Telephone Encounter (Signed)
Attempted to return pts call. LMOVM. 

## 2018-03-06 ENCOUNTER — Telehealth (HOSPITAL_COMMUNITY): Payer: Self-pay | Admitting: *Deleted

## 2018-03-06 ENCOUNTER — Inpatient Hospital Stay (HOSPITAL_COMMUNITY)
Admission: AD | Admit: 2018-03-06 | Discharge: 2018-03-09 | DRG: 787 | Disposition: A | Discharge status: Home / Self Care | Payer: BLUE CROSS/BLUE SHIELD | Attending: Family Medicine | Admitting: Family Medicine

## 2018-03-06 ENCOUNTER — Telehealth: Payer: Self-pay | Admitting: *Deleted

## 2018-03-06 ENCOUNTER — Encounter (HOSPITAL_COMMUNITY): Payer: Self-pay | Admitting: Anesthesiology

## 2018-03-06 DIAGNOSIS — Z23 Encounter for immunization: Secondary | ICD-10-CM | POA: Diagnosis not present

## 2018-03-06 DIAGNOSIS — Z811 Family history of alcohol abuse and dependence: Secondary | ICD-10-CM | POA: Diagnosis not present

## 2018-03-06 DIAGNOSIS — F17210 Nicotine dependence, cigarettes, uncomplicated: Secondary | ICD-10-CM | POA: Diagnosis present

## 2018-03-06 DIAGNOSIS — O99334 Smoking (tobacco) complicating childbirth: Secondary | ICD-10-CM | POA: Diagnosis present

## 2018-03-06 DIAGNOSIS — Z3A38 38 weeks gestation of pregnancy: Secondary | ICD-10-CM

## 2018-03-06 DIAGNOSIS — Z638 Other specified problems related to primary support group: Secondary | ICD-10-CM | POA: Diagnosis not present

## 2018-03-06 DIAGNOSIS — O9832 Other infections with a predominantly sexual mode of transmission complicating childbirth: Secondary | ICD-10-CM | POA: Diagnosis not present

## 2018-03-06 DIAGNOSIS — K5900 Constipation, unspecified: Secondary | ICD-10-CM | POA: Diagnosis not present

## 2018-03-06 DIAGNOSIS — Z813 Family history of other psychoactive substance abuse and dependence: Secondary | ICD-10-CM | POA: Diagnosis not present

## 2018-03-06 DIAGNOSIS — F1490 Cocaine use, unspecified, uncomplicated: Secondary | ICD-10-CM | POA: Diagnosis present

## 2018-03-06 DIAGNOSIS — A6000 Herpesviral infection of urogenital system, unspecified: Secondary | ICD-10-CM | POA: Diagnosis present

## 2018-03-06 DIAGNOSIS — O99324 Drug use complicating childbirth: Secondary | ICD-10-CM | POA: Diagnosis present

## 2018-03-06 DIAGNOSIS — F1290 Cannabis use, unspecified, uncomplicated: Secondary | ICD-10-CM | POA: Diagnosis not present

## 2018-03-06 DIAGNOSIS — Z98891 History of uterine scar from previous surgery: Secondary | ICD-10-CM

## 2018-03-06 DIAGNOSIS — F1190 Opioid use, unspecified, uncomplicated: Secondary | ICD-10-CM | POA: Diagnosis present

## 2018-03-06 DIAGNOSIS — Z831 Family history of other infectious and parasitic diseases: Secondary | ICD-10-CM | POA: Diagnosis not present

## 2018-03-06 LAB — CBC
HCT: 41.8 % (ref 36.0–46.0)
HEMOGLOBIN: 14 g/dL (ref 12.0–15.0)
MCH: 31.3 pg (ref 26.0–34.0)
MCHC: 33.5 g/dL (ref 30.0–36.0)
MCV: 93.5 fL (ref 80.0–100.0)
NRBC: 0 % (ref 0.0–0.2)
PLATELETS: 233 10*3/uL (ref 150–400)
RBC: 4.47 MIL/uL (ref 3.87–5.11)
RDW: 14 % (ref 11.5–15.5)
WBC: 15.7 10*3/uL — ABNORMAL HIGH (ref 4.0–10.5)

## 2018-03-06 MED ORDER — LACTATED RINGERS IV SOLN
INTRAVENOUS | Status: DC
  Administered 2018-03-06 – 2018-03-07 (×2): via INTRAVENOUS

## 2018-03-06 MED ORDER — CEFAZOLIN SODIUM-DEXTROSE 2-4 GM/100ML-% IV SOLN
2.0000 g | Freq: Once | INTRAVENOUS | Status: AC
  Administered 2018-03-07: 2 g via INTRAVENOUS

## 2018-03-06 MED ORDER — LIDOCAINE HCL (PF) 1 % IJ SOLN
30.0000 mL | INTRAMUSCULAR | Status: DC | PRN
  Filled 2018-03-06: qty 30

## 2018-03-06 MED ORDER — FLEET ENEMA 7-19 GM/118ML RE ENEM: 1.0000 | ENEMA | RECTAL | Status: DC | PRN

## 2018-03-06 MED ORDER — ONDANSETRON HCL 4 MG/2ML IJ SOLN
4.0000 mg | Freq: Four times a day (QID) | INTRAMUSCULAR | Status: DC | PRN
  Administered 2018-03-07: 4 mg via INTRAVENOUS

## 2018-03-06 MED ORDER — OXYTOCIN BOLUS FROM INFUSION: 500.0000 mL | Freq: Once | INTRAVENOUS | Status: DC

## 2018-03-06 MED ORDER — OXYTOCIN 40 UNITS IN LACTATED RINGERS INFUSION - SIMPLE MED
2.5000 [IU]/h | INTRAVENOUS | Status: DC
  Administered 2018-03-07: 2.5 [IU]/h via INTRAVENOUS
  Filled 2018-03-06: qty 1000

## 2018-03-06 MED ORDER — SOD CITRATE-CITRIC ACID 500-334 MG/5ML PO SOLN
30.0000 mL | ORAL | Status: DC | PRN
  Administered 2018-03-06: 30 mL via ORAL
  Filled 2018-03-06: qty 15

## 2018-03-06 MED ORDER — LACTATED RINGERS IV SOLN: 500.0000 mL | INTRAVENOUS | Status: DC | PRN

## 2018-03-06 MED ORDER — OXYCODONE-ACETAMINOPHEN 5-325 MG PO TABS: 1.0000 | ORAL_TABLET | ORAL | Status: DC | PRN

## 2018-03-06 MED ORDER — ACETAMINOPHEN 325 MG PO TABS: 650.0000 mg | ORAL_TABLET | ORAL | Status: DC | PRN

## 2018-03-06 MED ORDER — SODIUM CHLORIDE 0.9 % IV SOLN
2.0000 g | Freq: Once | INTRAVENOUS | Status: AC
  Administered 2018-03-06: 2 g via INTRAVENOUS
  Filled 2018-03-06: qty 2

## 2018-03-06 MED ORDER — OXYCODONE-ACETAMINOPHEN 5-325 MG PO TABS: 2.0000 | ORAL_TABLET | ORAL | Status: DC | PRN

## 2018-03-06 NOTE — Telephone Encounter (Signed)
Pt called stating that she has not had a bowel movement in 4 days. She states that she has tried stool softener and suppository with no results. Advised pt, per Dr Despina HiddenEure, that she could try an enema and if that didn't work she could use magnesium citrate. Pt verbalized understanding.

## 2018-03-06 NOTE — MAU Note (Signed)
Physician exited room-RN inquired whether any HSV lesion was seen.  None observed by MD.  RN [facility]

## 2018-03-06 NOTE — Telephone Encounter (Signed)
Preadmission screen  

## 2018-03-06 NOTE — Progress Notes (Signed)
Patient ID: Angela Moran, female   DOB: 06/03/1990, 27 y.o.   MRN: [id number]  Patient seen - had recent HSV outbreak < 10 days ago. Discussed c/s with patient as safest mode of delivery due to high risk of viral shedding, especially with patient telling me that she hasn't taken her medicine the way she was prescribed.   The risks of cesarean section discussed with the patient included but were not limited to: bleeding which may require transfusion or reoperation; infection which may require antibiotics; injury to bowel, bladder, ureters or other surrounding organs; injury to the fetus; need for additional procedures including hysterectomy in the event of a life-threatening hemorrhage; placental abnormalities wth subsequent pregnancies, incisional problems, thromboembolic phenomenon and other postoperative/anesthesia complications. The patient concurred with the proposed plan, giving informed written consent for the procedure.   Patient has been NPO since 9pm, she will remain NPO for procedure. Anesthesia and OR aware.  Preoperative prophylactic Ancef ordered on call to the OR.  To OR when ready.  [name], Jacob J, DO 03/06/2018 11:46 PM

## 2018-03-06 NOTE — Telephone Encounter (Signed)
Patient states she is having contractions about 6-7 minutes a part for a little over an hour.  Some are stronger than others and no bleeding or leaking is noted.  Informed patient it sounded like braxton hicks contractions and encouraged to try a warm bath/shower, push fluids and rest if possible or walk around if she wanted.  If contractions became stronger or more consistent, to call or [facility] to [facility]. Pt verbalized understanding with all questions answered.

## 2018-03-06 NOTE — H&P (Signed)
LABOR AND DELIVERY ADMISSION HISTORY AND PHYSICAL NOTE  Angela Moran is a 27 y.o. female G1P0000 with IUP at 3931w2d by early US presenting for SOL and vaginal bleeding.   She reports positive fetal movement. She denies leakage of fluid. Has had vaginal bleeding over the past several weeks that was c/f placental abruption.   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 She is unsure if it has resolved. This was not her first outbreak. Has been taking valtrex intermittently throughout pregnancy. Has not taken it over past few days.   Pt notes that she has smoked 1/2 ppd throughout pregnancy. She also reports that she drank a full bottle of wine yesterday, however, did not consume alcohol prior to this.   [facility] Pregnancy complications:  - HSV outbreak during 3rd trimester (took valtrex intermittently throughout pregnancy)  Past Medical History: Past Medical History:  Diagnosis Date  . Depression    doing ok  . HSV-2 infection   . Infection    UTI  . Pregnant   . 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 Vaginal Pap smear, abnormal    clear on repeat    Past Surgical History: Past Surgical History:  Procedure Laterality Date  . NO PAST SURGERIES      Obstetrical History: OB History    Gravida  1   Para  0   Term  0   Preterm  0   AB  0   Living  0     SAB  0   TAB  0   Ectopic  0   Multiple  0   Live Births  0           Social History: Social History   Socioeconomic History  . Marital status: Married    Spouse name: Not on file  . Number of children: Not on file  . Years of education: Not on file  . Highest education level: Not on file  Occupational History  . Not on file  Social Needs  . Financial resource strain: Not on file  . Food insecurity:    Worry: Not on file    Inability: Not on file  . Transportation needs:    Medical: Not on file    Non-medical:  Not on file  Tobacco Use  . Smoking status: Current Every Day Smoker    Packs/day: 0.25    Years: 6.00    Pack years: 1.50    Types: Cigarettes  . Smokeless tobacco: Never Used  . Tobacco comment: 6 cigs per day, hx of juul usuage for about 2 wks  Substance and Sexual Activity  . Alcohol use: Yes    Comment: 1/2 glass of wine per week  . Drug use: Not Currently    Types: Marijuana, Cocaine, Heroin    Comment: last used last week  . Sexual activity: Yes    Birth control/protection: Condom, None  Lifestyle  . Physical activity:    Days per week: Not on file    Minutes per session: Not on file  . Stress: Not on file  Relationships  . Social connections:    Talks on phone: Not on file    Gets together: Not on file    Attends religious service: Not on file    Active member of club or organization: Not on file    Attends meetings of clubs or organizations:  Not on file    Relationship status: Not on file  Other Topics Concern  . Not on file  Social History Narrative  . Not on file    Family History: Family History  Problem Relation Age of Onset  . Other Mother        Bone deteriation  . Alcohol abuse Mother   . Cirrhosis Father   . Alcohol abuse Father   . Diabetes Brother   . Alcohol abuse Maternal Grandmother   . Alcohol abuse Maternal Grandfather   . Alcohol abuse Paternal Grandmother   . Alcohol abuse Paternal Grandfather   . Autism Brother   . ADD / ADHD Brother     Allergies: No Known Allergies  Medications Prior to Admission  Medication Sig Dispense Refill Last Dose  . loratadine (CLARITIN) 10 MG tablet Take 10 mg by mouth daily as needed for allergies.   03/05/2018  . Prenatal Vit-Fe Fumarate-FA (MULTIVITAMIN-PRENATAL) 27-0.8 MG TABS tablet Take 1 tablet by mouth daily at 12 noon.   03/05/2018  . valACYclovir (VALTREX) 1000 MG tablet Take 1 tablet (1,000 mg total) by mouth 2 (two) times daily. 10 tablet 0 03/05/2018     Review of Systems  All systems  reviewed and negative except as stated in HPI  Physical Exam Blood pressure 128/86, pulse (!) 105, temperature (!) 97.4 F (36.3 C), resp. rate 18, SpO2 100 %. General appearance: alert, oriented, NAD Lungs: normal respiratory effort Heart: regular rate Abdomen: soft, non-tender; gravid, FH appropriate for GA Extremities: No calf swelling or tenderness Presentation: cephalic Fetal monitoring: Baseline FHR 130 bpm with moderate variability, +accelerations, -decelerations Uterine activity: Uterine contractions every    Prenatal labs: ABO, Rh: --/--/A POS, A POS Performed at [facility], [address] [place], [place] [address]  [phone]) Antibody: NEG (11/01 1191) Rubella: 1.28 (05/01 1235) RPR: Non Reactive (11/01 0943)  HBsAg: Negative (05/01 1235)  HIV: Non Reactive (08/26 0843)  GBS: Positive (10/04 0000)  2-hr GTT: normal Genetic screening:  Neg  Anatomy US: normal  Prenatal Transfer Tool  Maternal Diabetes: No Genetic Screening: Normal Maternal Ultrasounds/Referrals: Normal Fetal Ultrasounds or other Referrals:  None Maternal Substance Abuse:  Yes:  Type: Cocaine Significant Maternal Medications:  None Significant Maternal Lab Results: Lab values include: Group B Strep positive, Other: HSV   No results found for this or any previous visit (from the past 24 hour(s)).  Patient Active Problem List   Diagnosis Date Noted  . PCB (post coital bleeding) 02/24/2018  . Threatened preterm labor 02/23/2018  . 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) 02/23/2018  . Active genital herpes infection in third trimester 02/23/2018  . Smoker 12/18/2017  . Alcohol use affecting pregnancy, antepartum 12/18/2017  . Supervision of normal first pregnancy 08/23/2017  . History of illicit drug use 07/17/2017  . Anxiety disorder 12/04/2013    Assessment: Angela Moran is a 27 y.o. G1P0000 at [redacted]w[redacted]d here for labor.  Patient had recent HSV lesions.  Will proceed with c/s. Discussed risks with patient.  #MOF: Breast #MOC: IUD #Circ:  Outpatient circ  Angela Heritage, DO 11:49 PM 03/06/2018

## 2018-03-07 ENCOUNTER — Encounter (HOSPITAL_COMMUNITY)
Admission: AD | Disposition: A | Discharge status: Home / Self Care | Payer: Self-pay | Source: Home / Self Care | Attending: Family Medicine

## 2018-03-07 ENCOUNTER — Encounter (HOSPITAL_COMMUNITY): Payer: Self-pay

## 2018-03-07 ENCOUNTER — Inpatient Hospital Stay (HOSPITAL_COMMUNITY): Payer: BLUE CROSS/BLUE SHIELD | Admitting: Anesthesiology

## 2018-03-07 DIAGNOSIS — O9832 Other infections with a predominantly sexual mode of transmission complicating childbirth: Secondary | ICD-10-CM

## 2018-03-07 DIAGNOSIS — Z3A38 38 weeks gestation of pregnancy: Secondary | ICD-10-CM

## 2018-03-07 DIAGNOSIS — Z98891 History of uterine scar from previous surgery: Secondary | ICD-10-CM

## 2018-03-07 LAB — RPR: RPR Ser Ql: NONREACTIVE

## 2018-03-07 LAB — CBC
HCT: 33.9 % — ABNORMAL LOW (ref 36.0–46.0)
HEMOGLOBIN: 11.3 g/dL — AB (ref 12.0–15.0)
MCH: 31.6 pg (ref 26.0–34.0)
MCHC: 33.3 g/dL (ref 30.0–36.0)
MCV: 94.7 fL (ref 80.0–100.0)
NRBC: 0 % (ref 0.0–0.2)
PLATELETS: 201 10*3/uL (ref 150–400)
RBC: 3.58 MIL/uL — AB (ref 3.87–5.11)
RDW: 14.1 % (ref 11.5–15.5)
WBC: 17 10*3/uL — ABNORMAL HIGH (ref 4.0–10.5)

## 2018-03-07 LAB — TYPE AND SCREEN
ABO/RH(D): A POS
Antibody Screen: NEGATIVE

## 2018-03-07 SURGERY — Surgical Case
Anesthesia: Spinal

## 2018-03-07 MED ORDER — SIMETHICONE 80 MG PO CHEW: 80.0000 mg | CHEWABLE_TABLET | ORAL | Status: DC | PRN

## 2018-03-07 MED ORDER — NALBUPHINE HCL 10 MG/ML IJ SOLN: 5.0000 mg | Freq: Once | INTRAMUSCULAR | Status: DC | PRN

## 2018-03-07 MED ORDER — NALBUPHINE HCL 10 MG/ML IJ SOLN: 5.0000 mg | INTRAMUSCULAR | Status: DC | PRN

## 2018-03-07 MED ORDER — SCOPOLAMINE 1 MG/3DAYS TD PT72
1.0000 | MEDICATED_PATCH | Freq: Once | TRANSDERMAL | Status: DC
  Filled 2018-03-07: qty 1

## 2018-03-07 MED ORDER — LACTATED RINGERS IV SOLN
INTRAVENOUS | Status: DC | PRN
  Administered 2018-03-07: 01:00:00 via INTRAVENOUS

## 2018-03-07 MED ORDER — TETANUS-DIPHTH-ACELL PERTUSSIS 5-2.5-18.5 LF-MCG/0.5 IM SUSP: 0.5000 mL | Freq: Once | INTRAMUSCULAR | Status: DC

## 2018-03-07 MED ORDER — FENTANYL CITRATE (PF) 100 MCG/2ML IJ SOLN
INTRAMUSCULAR | Status: DC | PRN
  Administered 2018-03-07: 15 ug via INTRATHECAL

## 2018-03-07 MED ORDER — ZOLPIDEM TARTRATE 5 MG PO TABS: 5.0000 mg | ORAL_TABLET | Freq: Every evening | ORAL | Status: DC | PRN

## 2018-03-07 MED ORDER — PHENYLEPHRINE 8 MG IN D5W 100 ML (0.08MG/ML) PREMIX OPTIME
INJECTION | INTRAVENOUS | Status: DC | PRN
  Administered 2018-03-07: 60 ug/min via INTRAVENOUS

## 2018-03-07 MED ORDER — NALOXONE HCL 0.4 MG/ML IJ SOLN: 0.4000 mg | INTRAMUSCULAR | Status: DC | PRN

## 2018-03-07 MED ORDER — SIMETHICONE 80 MG PO CHEW
80.0000 mg | CHEWABLE_TABLET | Freq: Three times a day (TID) | ORAL | Status: DC
  Administered 2018-03-07 – 2018-03-09 (×6): 80 mg via ORAL
  Filled 2018-03-07 (×6): qty 1

## 2018-03-07 MED ORDER — DIPHENHYDRAMINE HCL 25 MG PO CAPS: 25.0000 mg | ORAL_CAPSULE | Freq: Four times a day (QID) | ORAL | Status: DC | PRN

## 2018-03-07 MED ORDER — NALOXONE HCL 4 MG/10ML IJ SOLN
1.0000 ug/kg/h | INTRAVENOUS | Status: DC | PRN
  Filled 2018-03-07: qty 5

## 2018-03-07 MED ORDER — OXYTOCIN 10 UNIT/ML IJ SOLN
INTRAVENOUS | Status: DC | PRN
  Administered 2018-03-07: 40 [IU] via INTRAVENOUS

## 2018-03-07 MED ORDER — PRENATAL MULTIVITAMIN CH
1.0000 | ORAL_TABLET | Freq: Every day | ORAL | Status: DC
  Administered 2018-03-07 – 2018-03-08 (×2): 1 via ORAL
  Filled 2018-03-07 (×2): qty 1

## 2018-03-07 MED ORDER — WITCH HAZEL-GLYCERIN EX PADS: 1.0000 "application " | MEDICATED_PAD | CUTANEOUS | Status: DC | PRN

## 2018-03-07 MED ORDER — IBUPROFEN 600 MG PO TABS
600.0000 mg | ORAL_TABLET | Freq: Four times a day (QID) | ORAL | Status: DC
  Administered 2018-03-07 – 2018-03-09 (×10): 600 mg via ORAL
  Filled 2018-03-07 (×10): qty 1

## 2018-03-07 MED ORDER — BUPIVACAINE IN DEXTROSE 0.75-8.25 % IT SOLN
INTRATHECAL | Status: DC | PRN
  Administered 2018-03-07: 12 mg via INTRATHECAL

## 2018-03-07 MED ORDER — OXYTOCIN 40 UNITS IN LACTATED RINGERS INFUSION - SIMPLE MED
2.5000 [IU]/h | INTRAVENOUS | Status: AC
  Administered 2018-03-07: 2.5 [IU]/h via INTRAVENOUS

## 2018-03-07 MED ORDER — PRENATAL MULTIVITAMIN CH
1.0000 | ORAL_TABLET | Freq: Every day | ORAL | Status: DC
  Filled 2018-03-07: qty 1

## 2018-03-07 MED ORDER — OXYCODONE-ACETAMINOPHEN 5-325 MG PO TABS
1.0000 | ORAL_TABLET | ORAL | Status: DC | PRN
  Administered 2018-03-07 – 2018-03-08 (×3): 1 via ORAL
  Filled 2018-03-07 (×3): qty 1

## 2018-03-07 MED ORDER — ACETAMINOPHEN 325 MG PO TABS
650.0000 mg | ORAL_TABLET | ORAL | Status: DC | PRN
  Administered 2018-03-07: 650 mg via ORAL
  Filled 2018-03-07 (×3): qty 2

## 2018-03-07 MED ORDER — DIBUCAINE 1 % RE OINT: 1.0000 "application " | TOPICAL_OINTMENT | RECTAL | Status: DC | PRN

## 2018-03-07 MED ORDER — OXYCODONE-ACETAMINOPHEN 5-325 MG PO TABS
2.0000 | ORAL_TABLET | ORAL | Status: DC | PRN
  Administered 2018-03-08 – 2018-03-09 (×2): 2 via ORAL
  Filled 2018-03-07 (×2): qty 2

## 2018-03-07 MED ORDER — MORPHINE SULFATE (PF) 0.5 MG/ML IJ SOLN
INTRAMUSCULAR | Status: AC
  Filled 2018-03-07: qty 10

## 2018-03-07 MED ORDER — SENNOSIDES-DOCUSATE SODIUM 8.6-50 MG PO TABS
2.0000 | ORAL_TABLET | ORAL | Status: DC
  Administered 2018-03-07 – 2018-03-08 (×2): 2 via ORAL
  Filled 2018-03-07 (×2): qty 2

## 2018-03-07 MED ORDER — SIMETHICONE 80 MG PO CHEW
80.0000 mg | CHEWABLE_TABLET | ORAL | Status: DC
  Administered 2018-03-07 – 2018-03-08 (×2): 80 mg via ORAL
  Filled 2018-03-07 (×2): qty 1

## 2018-03-07 MED ORDER — KETOROLAC TROMETHAMINE 30 MG/ML IJ SOLN: 30.0000 mg | Freq: Four times a day (QID) | INTRAMUSCULAR | Status: AC | PRN

## 2018-03-07 MED ORDER — MENTHOL 3 MG MT LOZG: 1.0000 | LOZENGE | OROMUCOSAL | Status: DC | PRN

## 2018-03-07 MED ORDER — DIPHENHYDRAMINE HCL 50 MG/ML IJ SOLN: 12.5000 mg | INTRAMUSCULAR | Status: DC | PRN

## 2018-03-07 MED ORDER — DIPHENHYDRAMINE HCL 25 MG PO CAPS
25.0000 mg | ORAL_CAPSULE | ORAL | Status: DC | PRN
  Administered 2018-03-07: 25 mg via ORAL
  Filled 2018-03-07: qty 1

## 2018-03-07 MED ORDER — ONDANSETRON HCL 4 MG/2ML IJ SOLN: 4.0000 mg | Freq: Three times a day (TID) | INTRAMUSCULAR | Status: DC | PRN

## 2018-03-07 MED ORDER — OXYTOCIN 40 UNITS IN LACTATED RINGERS INFUSION - SIMPLE MED
INTRAVENOUS | Status: AC
  Administered 2018-03-07: 2.5 [IU]/h via INTRAVENOUS
  Filled 2018-03-07: qty 1000

## 2018-03-07 MED ORDER — LACTATED RINGERS IV SOLN: INTRAVENOUS | Status: DC

## 2018-03-07 MED ORDER — FENTANYL CITRATE (PF) 100 MCG/2ML IJ SOLN
INTRAMUSCULAR | Status: AC
  Filled 2018-03-07: qty 2

## 2018-03-07 MED ORDER — MORPHINE SULFATE (PF) 0.5 MG/ML IJ SOLN
INTRAMUSCULAR | Status: DC | PRN
  Administered 2018-03-07: .15 mg via INTRATHECAL

## 2018-03-07 MED ORDER — DEXAMETHASONE SODIUM PHOSPHATE 10 MG/ML IJ SOLN
INTRAMUSCULAR | Status: DC | PRN
  Administered 2018-03-07: 10 mg via INTRAVENOUS

## 2018-03-07 MED ORDER — COCONUT OIL OIL: 1.0000 "application " | TOPICAL_OIL | Status: DC | PRN

## 2018-03-07 MED ORDER — PNEUMOCOCCAL VAC POLYVALENT 25 MCG/0.5ML IJ INJ
0.5000 mL | INJECTION | INTRAMUSCULAR | Status: AC
  Administered 2018-03-09: 0.5 mL via INTRAMUSCULAR
  Filled 2018-03-07 (×2): qty 0.5

## 2018-03-07 MED ORDER — SODIUM CHLORIDE 0.9% FLUSH: 3.0000 mL | INTRAVENOUS | Status: DC | PRN

## 2018-03-07 MED ORDER — SCOPOLAMINE 1 MG/3DAYS TD PT72
MEDICATED_PATCH | TRANSDERMAL | Status: DC | PRN
  Administered 2018-03-07: 1 via TRANSDERMAL

## 2018-03-07 SURGICAL SUPPLY — 40 items
BENZOIN TINCTURE PRP APPL 2/3 (GAUZE/BANDAGES/DRESSINGS) ×2 IMPLANT
CHLORAPREP W/TINT 26ML (MISCELLANEOUS) ×2 IMPLANT
CLAMP CORD UMBIL (MISCELLANEOUS) IMPLANT
CLOTH BEACON ORANGE TIMEOUT ST (SAFETY) ×2 IMPLANT
DERMABOND ADVANCED (GAUZE/BANDAGES/DRESSINGS) ×2
DERMABOND ADVANCED .7 DNX12 (GAUZE/BANDAGES/DRESSINGS) ×2 IMPLANT
DRSG OPSITE POSTOP 4X10 (GAUZE/BANDAGES/DRESSINGS) ×2 IMPLANT
ELECT REM PT RETURN 9FT ADLT (ELECTROSURGICAL) ×2
ELECTRODE REM PT RTRN 9FT ADLT (ELECTROSURGICAL) ×1 IMPLANT
EXTRACTOR VACUUM BELL STYLE (SUCTIONS) IMPLANT
GLOVE BIOGEL PI IND STRL 7.0 (GLOVE) ×1 IMPLANT
GLOVE BIOGEL PI IND STRL 8 (GLOVE) ×1 IMPLANT
GLOVE BIOGEL PI INDICATOR 7.0 (GLOVE) ×1
GLOVE BIOGEL PI INDICATOR 8 (GLOVE) ×1
GLOVE ECLIPSE 8.0 STRL XLNG CF (GLOVE) ×2 IMPLANT
GOWN STRL REUS W/TWL LRG LVL3 (GOWN DISPOSABLE) ×4 IMPLANT
KIT ABG SYR 3ML LUER SLIP (SYRINGE) ×2 IMPLANT
NEEDLE HYPO 18GX1.5 BLUNT FILL (NEEDLE) ×2 IMPLANT
NEEDLE HYPO 22GX1.5 SAFETY (NEEDLE) ×2 IMPLANT
NEEDLE HYPO 25X5/8 SAFETYGLIDE (NEEDLE) ×2 IMPLANT
NS IRRIG 1000ML POUR BTL (IV SOLUTION) ×2 IMPLANT
PACK C SECTION WH (CUSTOM PROCEDURE TRAY) ×2 IMPLANT
PAD OB MATERNITY 4.3X12.25 (PERSONAL CARE ITEMS) ×2 IMPLANT
PENCIL SMOKE EVAC W/HOLSTER (ELECTROSURGICAL) ×2 IMPLANT
RTRCTR C-SECT PINK 25CM LRG (MISCELLANEOUS) ×2 IMPLANT
SPONGE LAP 18X18 RF (DISPOSABLE) ×6 IMPLANT
STRIP CLOSURE SKIN 1/2X4 (GAUZE/BANDAGES/DRESSINGS) ×2 IMPLANT
SUT CHROMIC 0 CT 1 (SUTURE) ×2 IMPLANT
SUT MNCRL 0 VIOLET CTX 36 (SUTURE) ×2 IMPLANT
SUT MONOCRYL 0 CTX 36 (SUTURE) ×2
SUT PLAIN 2 0 (SUTURE)
SUT PLAIN 2 0 XLH (SUTURE) IMPLANT
SUT PLAIN ABS 2-0 CT1 27XMFL (SUTURE) IMPLANT
SUT VIC AB 0 CTX 36 (SUTURE) ×2
SUT VIC AB 0 CTX36XBRD ANBCTRL (SUTURE) ×1 IMPLANT
SUT VIC AB 4-0 KS 27 (SUTURE) IMPLANT
SYR 20CC LL (SYRINGE) ×4 IMPLANT
TOWEL OR 17X24 6PK STRL BLUE (TOWEL DISPOSABLE) ×2 IMPLANT
TRAY FOLEY W/BAG SLVR 14FR LF (SET/KITS/TRAYS/PACK) IMPLANT
WATER STERILE IRR 1000ML POUR (IV SOLUTION) ×2 IMPLANT

## 2018-03-07 NOTE — Op Note (Signed)
Angela Moran PROCEDURE DATE: 03/07/2018  PREOPERATIVE DIAGNOSIS: Intrauterine pregnancy at  8621w3d weeks gestation; recent HSV infection, labor  POSTOPERATIVE DIAGNOSIS: The same  PROCEDURE: Primary Low Transverse Cesarean Section  SURGEON:  Dr. Candelaria CelesteJacob Amjad Moran  ASSISTANT: Dr Marcy Sirenatherine Wallace, Trish MageFrances Jenkins, MS3  INDICATIONS: Angela Moran is a 27 y.o. G1P0000 at 4621w3d scheduled for cesarean section secondary to herpes virus infection, labor.  The risks of cesarean section discussed with the patient included but were not limited to: bleeding which may require transfusion or reoperation; infection which may require antibiotics; injury to bowel, bladder, ureters or other surrounding organs; injury to the fetus; need for additional procedures including hysterectomy in the event of a life-threatening hemorrhage; placental abnormalities wth subsequent pregnancies, incisional problems, thromboembolic phenomenon and other postoperative/anesthesia complications. The patient concurred with the proposed plan, giving informed written consent for the procedure.    FINDINGS:  Viable female infant in vertex presentation.  Apgars 8 and 9, weight, pending.  Clear amniotic fluid.  Intact placenta, three vessel cord.  Normal uterus, fallopian tubes and ovaries bilaterally.  ANESTHESIA:    Spinal INTRAVENOUS FLUIDS:1700 ml ESTIMATED BLOOD LOSS: 650 ml URINE OUTPUT:  200 ml SPECIMENS: Placenta sent to L&D COMPLICATIONS: None immediate  PROCEDURE IN DETAIL:  The patient received intravenous antibiotics and had sequential compression devices applied to her lower extremities while in the preoperative area.  She was then taken to the operating room where spinal anesthesia was administered and was found to be adequate. She was then placed in a dorsal supine position with a leftward tilt, and prepped and draped in a sterile manner.  A foley catheter was placed into her bladder and attached to constant gravity,  which drained clear fluid throughout.  After an adequate timeout was performed, a Pfannenstiel skin incision was made with scalpel and carried through to the underlying layer of fascia. The fascia was incised in the midline and this incision was extended bilaterally using the Mayo scissors. Kocher clamps were applied to the superior aspect of the fascial incision and the underlying rectus muscles were dissected off bluntly. The rectus muscles were separated in the midline bluntly and the peritoneum was entered bluntly. An Alexis retractor was placed to aid in visualization of the uterus.  Attention was turned to the lower uterine segment where a transverse hysterotomy was made with a scalpel and extended bilaterally bluntly. The infant was successfully delivered, and cord was clamped and cut and infant was handed over to awaiting neonatology team. Uterine massage was then administered and the placenta delivered intact with three-vessel cord. The uterus was then cleared of clot and debris.  The hysterotomy was closed with 0 Vicryl in a running locked fashion, and an imbricating layer was also placed with a 0 Vicryl. Overall, excellent hemostasis was noted. The abdomen and the pelvis were cleared of all clot and debris and the Jon Gillslexis was removed. Hemostasis was confirmed on all surfaces.  The peritoneum was reapproximated using 2-0 vicryl running stitches. The fascia was then closed using 0 Vicryl in a running fashion. The skin was closed with 4-0 vicryl. The patient tolerated the procedure well. Sponge, lap, instrument and needle counts were correct x 2. She was taken to the recovery room in stable condition.    Angela Moran, Angela Colquhoun J, DO 03/07/2018 1:23 AM

## 2018-03-07 NOTE — Anesthesia Postprocedure Evaluation (Signed)
Anesthesia Post Note  Patient: Angela Moran  Procedure(s) Performed: REPEAT CESAREAN SECTION (N/A )     Patient location during evaluation: PACU Anesthesia Type: Spinal Level of consciousness: oriented and awake and alert Pain management: pain level controlled Vital Signs Assessment: post-procedure vital signs reviewed and stable Respiratory status: spontaneous breathing, respiratory function stable and patient connected to nasal cannula oxygen Cardiovascular status: blood pressure returned to baseline and stable Postop Assessment: no headache, no backache and no apparent nausea or vomiting Anesthetic complications: no    Last Vitals:  Vitals:   03/07/18 0502 03/07/18 0614  BP: 114/74 (!) 106/56  Pulse: 79 69  Resp: 16 16  Temp: 37.2 C 36.8 C  SpO2: 99% 99%    Last Pain:  Vitals:   03/07/18 0614  TempSrc: Oral  PainSc: 2    Pain Goal:                 [name]

## 2018-03-07 NOTE — Anesthesia Procedure Notes (Signed)
Spinal

## 2018-03-07 NOTE — Anesthesia Preprocedure Evaluation (Signed)
Anesthesia Evaluation  Patient identified by MRN, date of birth, ID band Patient awake    Reviewed: Allergy & Precautions, H&P , NPO status , Patient's Chart, lab work & pertinent test results, reviewed documented beta blocker date and time   Airway Mallampati: II  TM Distance: >3 FB Neck ROM: full    Dental no notable dental hx.    Pulmonary neg pulmonary ROS, Current Smoker,    Pulmonary exam normal breath sounds clear to auscultation       Cardiovascular Exercise Tolerance: Good negative cardio ROS Normal cardiovascular exam Rhythm:regular Rate:Normal     Neuro/Psych Seizures -,  PSYCHIATRIC DISORDERS Anxiety Depression Seizures 2014 related to drug use      GI/Hepatic negative GI ROS, (+)     substance abuse  cocaine use,   Endo/Other  negative endocrine ROS  Renal/GU negative Renal ROS  negative genitourinary   Musculoskeletal   Abdominal   Peds  Hematology negative hematology ROS (+)   Anesthesia Other Findings   Reproductive/Obstetrics negative OB ROS                             Anesthesia Physical Anesthesia Plan  ASA: III and emergent  Anesthesia Plan: Spinal   Post-op Pain Management:    Induction:   PONV Risk Score and Plan: 2 and Ondansetron, Treatment may vary due to age or medical condition and Scopolamine patch - Pre-op  Airway Management Planned: Nasal Cannula, Natural Airway and Mask  Additional Equipment:   Intra-op Plan:   Post-operative Plan:   Informed Consent: I have reviewed the patients History and Physical, chart, labs and discussed the procedure including the risks, benefits and alternatives for the proposed anesthesia with the patient or authorized representative who has indicated his/her understanding and acceptance.   Dental Advisory Given  Plan Discussed with: CRNA, Anesthesiologist and Surgeon  Anesthesia Plan Comments: (  )         Anesthesia Quick Evaluation

## 2018-03-07 NOTE — Transfer of Care (Signed)
Immediate Anesthesia Transfer of Care Note  Patient: Angela Moran  Procedure(s) Performed: REPEAT CESAREAN SECTION (N/A )  Patient Location: PACU  Anesthesia Type:Spinal  Level of Consciousness: awake, alert , oriented and patient cooperative  Airway & Oxygen Therapy: Patient Spontanous Breathing  Post-op Assessment: Report given to RN and Post -op Vital signs reviewed and stable  Post vital signs: Reviewed and stable  Last Vitals:  Vitals Value Taken Time  BP 127/103 03/07/2018  1:25 AM  Temp    Pulse 85 03/07/2018  1:27 AM  Resp 17 03/07/2018  1:27 AM  SpO2 100 % 03/07/2018  1:27 AM  Vitals shown include unvalidated device data.  Last Pain:  Vitals:   03/06/18 2346  TempSrc: Oral  PainSc: 10-Worst pain ever         Complications: No apparent anesthesia complications

## 2018-03-07 NOTE — Anesthesia Procedure Notes (Signed)
Spinal  Patient location during procedure: OR Start time: 03/07/2018 12:20 AM End time: 03/07/2018 12:25 AM Staffing Anesthesiologist: Bethena Midgetddono, Alexei Ey, MD Preanesthetic Checklist Completed: patient identified, site marked, surgical consent, pre-op evaluation, timeout performed, IV checked, risks and benefits discussed and monitors and equipment checked Spinal Block Patient position: sitting Prep: DuraPrep Patient monitoring: heart rate, cardiac monitor, continuous pulse ox and blood pressure Approach: midline Location: L3-4 Injection technique: single-shot Needle Needle type: Sprotte  Needle gauge: 24 G Needle length: 9 cm Assessment Sensory level: T4

## 2018-03-08 MED ORDER — POLYETHYLENE GLYCOL 3350 17 G PO PACK
17.0000 g | PACK | Freq: Every day | ORAL | Status: DC
  Administered 2018-03-08 – 2018-03-09 (×2): 17 g via ORAL
  Filled 2018-03-08 (×3): qty 1

## 2018-03-08 NOTE — Progress Notes (Signed)
CSW made [facility] C[place] report for MOB's admission to alcohol and polysubstance use during pregnancy.  Report was made to CPS worker J. Corine ShelterWatkins. CPS will follow-up with family within 72 hours.   There are no barriers to discharge.   Blaine HamperAngel Boyd-Gilyard, MSW, LCSW Clinical Social Work [phone]

## 2018-03-08 NOTE — Clinical Social Work Maternal (Signed)
CLINICAL SOCIAL WORK MATERNAL/CHILD NOTE  Patient Details  Name: Angela Moran MRN: [id number] Date of Birth: 11/20/1990  Date:  03/08/2018  Clinical Social Worker Initiating Note:  Rasheen Bells, LCSWA   Date/Time: Initiated:  03/08/18/1200             Child's Name:  Angela Moran   Biological Parents:  Mother, Father(Father - Jospeh Moran 01-03-91)   Need for Interpreter:  None   Reason for Referral:  Behavioral Health Concerns, Current Substance Use/Substance Use During Pregnancy    Address:  [address] [place] [address]    Phone number:  [phone] (home)     Additional phone number:   Household Members/Support Persons (HM/SP):   Household Member/Support Person 1, Household Member/Support Person 2, Household Member/Support Person 3   HM/SP Name Relationship DOB or Age  HM/SP -1  MOB mother    HM/SP -2  MOB mother's boyfriend   HM/SP -3 William Edwards MOB brother 10-06-02  HM/SP -4     HM/SP -5     HM/SP -6     HM/SP -7     HM/SP -8       Natural Supports (not living in the home): Extended Family(FOB sister)   Professional Supports:None   Employment:Full-time   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 Kidder)  Pediatrician List:   [place]   [place]   [place]   [place]   [place]   [place]     Pediatrician Fax Number:    Risk Factors/Current Problems:     Cognitive State: Able to Concentrate , Alert , Linear Thinking    Mood/Affect: Interested , Relaxed    CSW Assessment:CSW spoke with MOB at bedside to discuss multiple consult concerns. CSW inquired  about MOB substance use during pregnancy. MOB reported that she used cocaine, pills and etoh during pregnancy because of anger and sadness towards FOB. MOB reported that her last use was 3 weeks ago and she took some pills. CSW asked MOB if she felt like she had a substance abuse problem, MOB replied no and reported that she has ended her relationship with FOB and that she does not plan to use drugs anymore. CSW inquired how MOB will now cope with anger and sadness, MOB reported that ending relationship with FOB should eliminate her anger and sadness. MOB reported that she will be eventually moving in with her mom at [address] [place]  [address] and that she will also sometimes be staying at [address] [place]  [address]. MOB reported that she has all essential items for baby to care for him. CSW informed MOB about hospital drug policy and that CPS would be notified if warranted, MOB verbalized understanding.   CSW inquired about MOB mental health hx, MOB reported that she was diagnosed with depression 6 years ago and that she was put on Prozac during an inpatient admission at [facility]. MOB reported that she stopped using the Prozac and does not feel that it is needed. MOB denied depressive symptoms and reported that her sadness was situational and attributed to her FOB. MOB did not present with any acute mental health   signs or symptoms and possessed insight about mental health history. CSW assessed for safety, MOB denied SI, HI and domestic violence.   CSW provided education regarding the baby blues period vs. perinatal mood disorders, discussed treatment and gave resources for mental health follow up if concerns arise.  CSW recommends self-evaluation during the postpartum time period using the New Mom Checklist from Postpartum Progress and encouraged MOB to contact a medical professional if symptoms are noted at any time.    CSW provided review of Sudden Infant  Death Syndrome (SIDS) precautions.    CSW identifies no further need for intervention and no barriers to discharge at this time.   CSW Plan/Description: No Further Intervention Required/No Barriers to Discharge, Sudden Infant Death Syndrome (SIDS) Education, Perinatal Mood and Anxiety Disorder (PMADs) Education, CSW Will Continue to Monitor Umbilical Cord Tissue Drug Screen Results and Make Report if Warranted, Hospital Drug Screen Policy Information    Janasia Coverdale L Malik Ruffino, LCSW 03/08/2018, 12:04 PM           

## 2018-03-08 NOTE — Lactation Note (Signed)
This note was copied from a baby's chart. Lactation Consultation Note  Patient Name: Angela Moran ZOXWR'UToday's Date: 03/08/2018 Reason for consult: Initial assessment;Early term 37-38.6wks;1st time breastfeeding;Difficult latch;Mother's request P1, 28 hour female infant, ETI BF concerns: Mom thinking she doesn't have milk, short shafted nipples, sore breast due to  pain with  Previous latch. LC entered room per mom, given formula earlier, LC notice infant still cuing in basinet . Per mom, she given formula because she felt she did not have any milk and infant seemed hungry. LC ask mom hand express, mom taught back and expressed 2 ml of colostrum that was spoon feed to infant. Mom was fitted earlier by nurse with 20 mm NS. LC asked mom demonstrated how to put on breast. Mom did nipple roll prior to placing NS, mom  latched infant on left breast using cross cradle hold, infant had deep latch with audible swallows observed by [facility] and nurse. Per mom, she is not feeling pain only tug, LC discussed with mom infant's mouth should be wide like biting into an apple, lower jaw extended downwards, when latched infant should be nose to breast. LC discussed I & O. Reviewed Baby & Me book's Breastfeeding Basics.  Mom shown how to use hand pump by nurse. Per mom, she doesn't want use DEBP  and to have pay cost of pumping or for attachments states she heard that it cost $ 50 use pump in hospital.  Mom given comfort gels by nurse. Mom made aware of O/P services, breastfeeding support groups, community resources, and our phone # for post-discharge questions.   Mother's goals: 1. Mom BF according hunger cues, 8 to 12 times within 24 hours including nights. 2. Mom will pre-pump, do nipple roll prior to applying NS and before latching infant to breast. 3. Mom can hand express and give  Infant  EBM  back after feeding infant at breast.  Maternal Data Has patient been taught Hand Expression?: Yes Does the patient  have breastfeeding experience prior to this delivery?: No  Feeding Feeding Type: Breast Fed Nipple Type: Slow - flow  LATCH Score Latch: Grasps breast easily, tongue down, lips flanged, rhythmical sucking.  Audible Swallowing: Spontaneous and intermittent  Type of Nipple: Everted at rest and after stimulation(short shafted )  Comfort (Breast/Nipple): Soft / non-tender  Hold (Positioning): Assistance needed to correctly position infant at breast and maintain latch.  LATCH Score: 9  Interventions Interventions: Breast feeding basics reviewed;Assisted with latch;Breast compression;Adjust position;Support pillows;Breast massage;Skin to skin;Hand express;Hand pump;Comfort gels  Lactation Tools Discussed/Used Tools: Comfort gels;Pump;Nipple Shields Nipple shield size: 20 Breast pump type: Manual WIC Program: No Pump Review: Setup, frequency, and cleaning;Milk Storage Initiated by:: by Nurse Date initiated:: 03/07/18   Consult Status Consult Status: Follow-up Date: 03/08/18 Follow-up type: In-patient    [name] 03/08/2018, 5:17 AM

## 2018-03-08 NOTE — Progress Notes (Signed)
Mother is requesting formula for her baby because she is frustrated with trying to latch baby. Mother used cocaine within the last month according to MD note on 02/23/2018. Will support mother with formula feeding.

## 2018-03-08 NOTE — Progress Notes (Addendum)
Post Operative Day #1 Subjective: up ad lib, voiding, tolerating PO, + flatus and no bowel movement yet and concerned because she was constipated prior to delivery  Objective: Blood pressure 106/63, pulse 63, temperature 98 F (36.7 C), temperature source Oral, resp. rate 18, SpO2 99 %, unknown if currently breastfeeding.  Physical Exam:  General: alert and cooperative Lochia: appropriate Uterine Fundus: firm Incision: healing well, no significant drainage (small amount of dried blood) no dehiscence, no significant erythema DVT Evaluation: No evidence of DVT seen on physical exam. Negative Homan's sign.  Recent Labs    03/06/18 2328 03/07/18 0523  HGB 14.0 11.3*  HCT 41.8 33.9*   Assessment/Plan:  POD#1 - Hg 14.0 -> 11.3  -- routine post operative care -- breastfeeding, lactation consult as needed -- SW consult for UDS +cocaine on admission -- adding miralax for constipation    LOS: 2 days   Burman NievesJulia Layann Bluett, MD Family Medicine Resident  03/08/2018, 7:54 AM

## 2018-03-09 MED ORDER — OXYCODONE-ACETAMINOPHEN 5-325 MG PO TABS: 1.0000 | ORAL_TABLET | ORAL | 0 refills | Status: DC | PRN

## 2018-03-09 MED ORDER — IBUPROFEN 600 MG PO TABS: 600.0000 mg | ORAL_TABLET | Freq: Four times a day (QID) | ORAL | 0 refills | Status: DC

## 2018-03-09 MED ORDER — INFLUENZA VAC SPLIT QUAD 0.5 ML IM SUSY
0.5000 mL | PREFILLED_SYRINGE | INTRAMUSCULAR | Status: AC
  Administered 2018-03-09: 0.5 mL via INTRAMUSCULAR

## 2018-03-09 NOTE — Discharge Summary (Signed)
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 yo G1P1001 at [redacted]w[redacted]d was admitted to the hospital 03/06/2018 for scheduled cesarean section with the following indication:Active HSV.  Membrane Rupture Time/Date: 12:39 AM ,03/07/2018   Patient delivered a Viable infant.03/07/2018  Details of operation can be found in separate operative note.  Pateint had an uncomplicated postpartum course.  She is ambulating, tolerating a regular diet, passing flatus, and urinating well. Patient is discharged home in stable condition on  03/09/18. CPS report filed due to alcohol and polysubstance use during pregnancy, however no barriers to discharge.          Physical exam  Vitals:   03/08/18 0515 03/08/18 1050 03/08/18 1300 03/09/18 0533  BP: 106/63 (!) 98/58 106/78 108/66  Pulse: 63 64 63 79  Resp: 18 18 19 16   Temp: 98 F (36.7 C) 97.6 F (36.4 C) 97.9 F (36.6 C) 97.8 F (36.6 C)  TempSrc: Oral Oral Oral Oral  SpO2: 99%   98%   General: alert, cooperative and no distress Lochia: appropriate Uterine Fundus: firm Incision: Healing well with no significant drainage, Dressing is clean, dry, and intact DVT Evaluation: No evidence of DVT seen on physical exam. No significant calf/ankle  edema. Labs: Lab Results  Component Value Date   WBC 17.0 (H) 03/07/2018   HGB 11.3 (L) 03/07/2018   HCT 33.9 (L) 03/07/2018   MCV 94.7 03/07/2018   PLT 201 03/07/2018   CMP Latest Ref Rng & Units 12/18/2017  Glucose 65 - 99 mg/dL 409(W)  BUN 6 - 20 mg/dL 8  Creatinine 1.19 - 1.47 mg/dL 8.29  Sodium 562 - 130 mmol/L 139  Potassium 3.5 - 5.2 mmol/L 4.4  Chloride 96 - 106 mmol/L 105  CO2 20 - 29 mmol/L 20  Calcium 8.7 - 10.2 mg/dL 9.0  Total Protein 6.0 - 8.5 g/dL 6.2  Total Bilirubin 0.0 - 1.2 mg/dL <8.6  Alkaline Phos 39 - 117 IU/L 55  AST 0 - 40 IU/L 13  ALT 0 - 32 IU/L 13    Discharge instruction: per After Visit Summary and "Baby and Me Booklet".  After visit meds:  Allergies as of 03/09/2018   No Known Allergies     Medication List    TAKE these medications   docusate sodium 100 MG capsule Commonly known  as:  COLACE Take 100 mg by mouth 2 (two) times daily as needed for mild constipation.   ibuprofen 600 MG tablet Commonly known as:  ADVIL,MOTRIN Take 1 tablet (600 mg total) by mouth every 6 (six) hours.   loratadine 10 MG tablet Commonly known as:  CLARITIN Take 10 mg by mouth daily as needed for allergies.   oxyCODONE-acetaminophen 5-325 MG tablet Commonly known as:  PERCOCET/ROXICET Take 1 tablet by mouth every 4 (four) hours as needed for up to 15 doses for moderate pain or severe pain (pain scale 4-7).   prenatal multivitamin Tabs tablet Take 1 tablet by mouth daily at 12 noon.   valACYclovir 1000 MG tablet Commonly known as:  VALTREX Take 1 tablet (1,000 mg total) by mouth 2 (two) times daily.       Diet: routine diet  Activity: Advance as tolerated. Pelvic rest for 6 weeks.   Outpatient follow up:4 weeks and 1 week incision check  Follow up Appt: Future Appointments  Date Time Provider Department Center  03/20/2018 11:30 AM Lazaro ArmsEure, Luther H, MD [facility]   Follow up Visit:No follow-ups on file.  Postpartum contraception:  IUD  Newborn Data: Live born female  Birth Weight: 7 lb 5.6 oz (3335 g) APGAR: 8, 9  Newborn Delivery   Birth date/time:  03/07/2018 00:40:00 Delivery type:  C-Section, Low Transverse Trial of labor:  No C-section categorization:  Primary     Baby Feeding: Bottle and Breast Disposition:home with mother   03/09/2018 Allayne StackSamantha N , DO

## 2018-03-12 ENCOUNTER — Encounter (HOSPITAL_COMMUNITY)
Admission: RE | Admit: 2018-03-12 | Discharge: 2018-03-12 | Disposition: A | Discharge status: Home / Self Care | Payer: BLUE CROSS/BLUE SHIELD | Source: Ambulatory Visit

## 2018-03-13 ENCOUNTER — Inpatient Hospital Stay (HOSPITAL_COMMUNITY): Admit: 2018-03-13 | Payer: Self-pay | Admitting: Obstetrics & Gynecology

## 2018-03-14 ENCOUNTER — Telehealth: Payer: Self-pay | Admitting: Physician Assistant

## 2018-03-14 NOTE — Telephone Encounter (Signed)
Copied from CRM [id number]. Topic: Quick Communication - Rx Refill/Question >> Mar 12, 2018 10:23 AM Frances FurbishIlderton, Jessica L wrote: Medication: oxyCODONE-acetaminophen (PERCOCET/ROXICET) 5-325 MG tablet [[id number]][[id number]] would like to know if this can be refilled for one more day. Please advise [phone]#[phone] [id number] - [place], [place] - [address] [phone] (Phone) [phone] (Fax)

## 2018-03-15 NOTE — Telephone Encounter (Signed)
If she's still having this much pain that she needs percocet, then she should be seen.

## 2018-03-16 NOTE — Telephone Encounter (Signed)
Called patient but VM was not set up. Was advising patient that if her pain is that severe. She would need to see her GYN.

## 2018-03-20 ENCOUNTER — Telehealth: Payer: Self-pay | Admitting: Obstetrics & Gynecology

## 2018-03-20 ENCOUNTER — Encounter: Payer: BLUE CROSS/BLUE SHIELD | Admitting: Obstetrics & Gynecology

## 2018-03-20 NOTE — Telephone Encounter (Signed)
LMOVM returning patient's call.  

## 2018-03-20 NOTE — Telephone Encounter (Signed)
Patient called, stated that she had an appointment for today for post-op from her c-section.  [place] in [place] and will not be back for a couple of weeks.  She wants to know if it's okay to wait that long to follow up.  She tried to see a physician there and they would not see her because they did not do her surgery.  Please advise.  [phone]

## 2018-03-20 NOTE — Telephone Encounter (Signed)
Patient states she is out of town visiting family and cannot make it to her incision check appt.  She has already taken off the bandage including the steri strips. States the incision looks fine with no evidence of infection.  Advised patient to call us and make appt when she returns to town for provider to look at it.  Pt verbalized understanding.

## 2018-04-05 ENCOUNTER — Encounter: Payer: Self-pay | Admitting: Obstetrics & Gynecology

## 2018-04-05 ENCOUNTER — Ambulatory Visit (INDEPENDENT_AMBULATORY_CARE_PROVIDER_SITE_OTHER): Payer: BLUE CROSS/BLUE SHIELD | Admitting: Obstetrics & Gynecology

## 2018-04-05 VITALS — BP 100/68 | HR 52 | Ht 67.0 in | Wt 168.0 lb

## 2018-04-05 DIAGNOSIS — Z98891 History of uterine scar from previous surgery: Secondary | ICD-10-CM | POA: Diagnosis not present

## 2018-04-05 MED ORDER — ESCITALOPRAM OXALATE 20 MG PO TABS: 20.0000 mg | ORAL_TABLET | Freq: Every day | ORAL | 3 refills | Status: DC

## 2018-04-05 NOTE — Progress Notes (Signed)
  HPI: Patient returns for routine postoperative follow-up having undergone primary c section on 03/07/2018.  The patient's immediate postoperative recovery has been unremarkable. Since hospital discharge the patient reports has had sex x 3 encouraged to wait. G1P1001   Current Outpatient Medications: loratadine (CLARITIN) 10 MG tablet, Take 10 mg by mouth daily as needed for allergies., Disp: , Rfl:  valACYclovir (VALTREX) 1000 MG tablet, Take 1 tablet (1,000 mg total) by mouth 2 (two) times daily., Disp: 10 tablet, Rfl: 0 docusate sodium (COLACE) 100 MG capsule, Take 100 mg by mouth 2 (two) times daily as needed for mild constipation., Disp: , Rfl:  escitalopram (LEXAPRO) 20 MG tablet, Take 1 tablet (20 mg total) by mouth daily., Disp: 30 tablet, Rfl: 3 ibuprofen (ADVIL,MOTRIN) 600 MG tablet, Take 1 tablet (600 mg total) by mouth every 6 (six) hours. (Patient not taking: Reported on 04/05/2018), Disp: 30 tablet, Rfl: 0 oxyCODONE-acetaminophen (PERCOCET/ROXICET) 5-325 MG tablet, Take 1 tablet by mouth every 4 (four) hours as needed for up to 15 doses for moderate pain or severe pain (pain scale 4-7). (Patient not taking: Reported on 04/05/2018), Disp: 15 tablet, Rfl: 0 Prenatal Vit-Fe Fumarate-FA (PRENATAL MULTIVITAMIN) TABS tablet, Take 1 tablet by mouth daily at 12 noon., Disp: , Rfl:   No current facility-administered medications for this visit.     Blood pressure 100/68, pulse (!) 52, height 5\' 7"  (1.702 m), weight 168 lb (76.2 kg), not currently breastfeeding.  Physical Exam: Incision clean dry intact  Diagnostic Tests:   Pathology:   Impression: S/p C section 03/08/19-19 PP depression, history of depression   Plan: Meds ordered this encounter  Medications  . escitalopram (LEXAPRO) 20 MG tablet    Sig: Take 1 tablet (20 mg total) by mouth daily.    Dispense:  30 tablet    Refill:  3     Follow up: 4  weeks  Considering IUD if doesn't get pregnant in the  meantime  Lazaro ArmsLuther H Eure, MD

## 2018-04-10 ENCOUNTER — Telehealth: Payer: Self-pay | Admitting: *Deleted

## 2018-04-10 NOTE — Telephone Encounter (Signed)
Tobi Bastosnna from [facility] called stating she just finished visiting with patient and she scored 20 on depression screen.  Patient was recently started on Lexapro last Thursday and she has been taking it.   I did follow-up with patient who states she does not have a plan at this time but has had thoughts that she did not want to be here. Advised to call us or [facility] if she felt like she was going to act on anything.  Pt verbalized understanding.

## 2018-04-13 ENCOUNTER — Ambulatory Visit (INDEPENDENT_AMBULATORY_CARE_PROVIDER_SITE_OTHER): Payer: BLUE CROSS/BLUE SHIELD | Admitting: Women's Health

## 2018-04-13 ENCOUNTER — Encounter (INDEPENDENT_AMBULATORY_CARE_PROVIDER_SITE_OTHER): Payer: Self-pay

## 2018-04-13 ENCOUNTER — Encounter: Payer: Self-pay | Admitting: Women's Health

## 2018-04-13 DIAGNOSIS — F418 Other specified anxiety disorders: Secondary | ICD-10-CM

## 2018-04-13 DIAGNOSIS — Z98891 History of uterine scar from previous surgery: Secondary | ICD-10-CM

## 2018-04-13 DIAGNOSIS — Z789 Other specified health status: Secondary | ICD-10-CM | POA: Insufficient documentation

## 2018-04-13 DIAGNOSIS — Z7289 Other problems related to lifestyle: Secondary | ICD-10-CM

## 2018-04-13 DIAGNOSIS — Z3009 Encounter for other general counseling and advice on contraception: Secondary | ICD-10-CM

## 2018-04-13 DIAGNOSIS — F1090 Alcohol use, unspecified, uncomplicated: Secondary | ICD-10-CM | POA: Insufficient documentation

## 2018-04-13 NOTE — Patient Instructions (Signed)
NO SEX UNTIL AFTER YOU GET YOUR BIRTH CONTROL    Perinatal Depression When a woman feels excessive sadness, anger, or anxiety during pregnancy or during the first 12 months after she gives birth, she has a condition called perinatal depression. Depression can interfere with work, school, relationships, and other everyday activities. If it is not managed properly, it can also cause problems in the mother and her baby. Sometimes, perinatal depression is left untreated because symptoms are thought to be normal mood swings during and right after pregnancy. If you have symptoms of depression, it is important to talk with your health care provider. What are the causes? The exact cause of this condition is not known. Hormonal changes during and after pregnancy may play a role in causing perinatal depression. What increases the risk? You are more likely to develop this condition if:  You have a personal or family history of depression, anxiety, or mood disorders.  You experience a stressful life event during pregnancy, such as the death of a loved one.  You have a lot of regular life stress.  You do not have support from family members or loved ones, or you are in an abusive relationship. What are the signs or symptoms? Symptoms of this condition include:  Feeling sad or hopeless.  Feelings of guilt.  Feeling irritable or overwhelmed.  Changes in your appetite.  Lack of energy or motivation.  Sleep problems.  Difficulty concentrating or completing tasks.  Loss of interest in hobbies or relationships.  Headaches or stomach problems that do not go away. How is this diagnosed? This condition is diagnosed based on a physical exam and mental evaluation. In some cases, your health care provider may use a depression screening tool. These tools include a list of questions that can help a health care provider diagnose depression. Your health care provider may refer you to a mental health expert  who specializes in depression. How is this treated? This condition may be treated with:  Medicines. Your health care provider will only give you medicines that have been proven safe for pregnancy and breastfeeding.  Talk therapy with a mental health professional to help change your patterns of thinking (cognitive behavioral therapy).  Support groups.  Brain stimulation or light therapies.  Stress reduction therapies, such as mindfulness. Follow these instructions at home: Lifestyle  Do not use any products that contain nicotine or tobacco, such as cigarettes and e-cigarettes. If you need help quitting, ask your health care provider.  Do not use alcohol when you are pregnant. After your baby is born, limit alcohol intake to no more than 1 drink a day. One drink equals 12 oz of beer, 5 oz of wine, or 1 oz of hard liquor.  Consider joining a support group for new mothers. Ask your health care provider for recommendations.  Take good care of yourself. Make sure you: ? Get plenty of sleep. If you are having trouble sleeping, talk with your health care provider. ? Eat a healthy diet. This includes plenty of fruits and vegetables, whole grains, and lean proteins. ? Exercise regularly, as told by your health care provider. Ask your health care provider what exercises are safe for you. General instructions  Take over-the-counter and prescription medicines only as told by your health care provider.  Talk with your partner or family members about your feelings during pregnancy. Share any concerns or anxieties that you may have.  Ask for help with tasks or chores when you need it. Ask friends  and family members to provide meals, watch your children, or help with cleaning.  Keep all follow-up visits as told by your health care provider. This is important. Contact a health care provider if:  You (or people close to you) notice that you have any symptoms of depression.  You have depression  and your symptoms get worse.  You experience side effects from medicines, such as nausea or sleep problems. Get help right away if:  You feel like hurting yourself, your baby, or someone else. If you ever feel like you may hurt yourself or others, or have thoughts about taking your own life, get help right away. You can go to your nearest emergency department or call:  Your local emergency services (911 in the [place]  A suicide crisis helpline, such as the National Suicide Prevention Lifeline at 30956913711-843-107-6397. This is open 24 hours a day. Summary  Perinatal depression is when a woman feels excessive sadness, anger, or anxiety during pregnancy or during the first 12 months after she gives birth.  If perinatal depression is not treated, it can lead to health problems for the mother and her baby.  This condition is treated with medicines, talk therapy, stress reduction therapies, or a combination of two or more treatments.  Talk with your partner or family members about your feelings. Do not be afraid to ask for help. This information is not intended to replace advice given to you by your health care provider. Make sure you discuss any questions you have with your health care provider. Document Released: 06/08/2016 Document Revised: 06/08/2016 Document Reviewed: 06/08/2016 Elsevier Interactive Patient Education  [employer]2019 Elsevier Inc.

## 2018-04-13 NOTE — Progress Notes (Signed)
POSTPARTUM VISIT Patient name: Angela GarlandCallie J Altland MRN [id number]  Date of birth: 03/16/1991 Chief Complaint:   Postpartum Care  History of Present Illness:   Angela Moran is a 27 y.o. 421P1001 Caucasian female being seen today for a postpartum visit. She is 5 weeks postpartum following a primary cesarean section, low transverse incision at 38.2 gestational weeks d/t recent HSV outbreak. Anesthesia: spinal. Laceration: n/a. I have fully reviewed the prenatal and intrapartum course. 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 Postpartum course has been complicated by boyfriend purposely OD'ing while she was on phone with him, he was able to be saved, and is currently in jail for nonrelated issues. . Bleeding no bleeding. Bowel function is normal. Bladder function is normal.  Patient is sexually active. Last sexual activity: 2wks ago, has had sex x3, used condoms q time.  Contraception method is wants [place] IUD.  [place] Postpartum Depression Screening: positive. Score 15. Has h/o dep/anx, states she was doing fine prior to boyfriend OD'ing, but since then things have gone 'downhill'. Was rx'd lexapro 20mg  last week on 12/12, states she can already tell an improvement in depression, still feels anxious/overwhelmed. Lives w/ boyfriend's sister who helps her a lot. Denies SI/HI/II. Not sleeping well, decreased appetite, doesn't find joy in things she used to. Declines counseling at this time.  Denies cocaine/heroine use. Does rely on etoh to help relax/calm her, reports 4 beers/day or 3 shots. Feels once Lexapro starts kicking in more, she will be able to cut back on this.  Last pap 05/22/15.  Results were normal .  No LMP recorded.  Baby's course has been uncomplicated. Baby is feeding by bottle.  Review of Systems:   Pertinent items are noted in HPI Denies Abnormal vaginal discharge w/ itching/odor/irritation, headaches, visual changes, shortness of breath, chest pain, abdominal  pain, severe nausea/vomiting, or problems with urination or bowel movements. Pertinent History Reviewed:  Reviewed past medical,surgical, obstetrical and family history.  Reviewed problem list, medications and allergies. OB History  Gravida Para Term Preterm AB Living  1 1 1  0 0 1  SAB TAB Ectopic Multiple Live Births  0 0 0 0 1    # Outcome Date GA Lbr Len/2nd Weight Sex Delivery Anes PTL Lv  1 Term 03/07/18 [id number]  7 lb 5.6 oz (3.335 kg) M CS-LTranv Spinal  LIV   Physical Assessment:   Vitals:   04/13/18 1202  BP: 118/77  Pulse: 60  Weight: 168 lb (76.2 kg)  Height: 5\' 7"  (1.702 m)  Body mass index is 26.31 kg/m.       Physical Examination:   General appearance: alert, well appearing, and in no distress  Mental status: alert, oriented to person, place, and time  Skin: warm & dry   Cardiovascular: normal heart rate noted   Respiratory: normal respiratory effort, no distress   Breasts: deferred, no complaints   Abdomen: soft, non-tender, c/s incision well-healed   Pelvic: VULVA: normal appearing vulva with no masses, tenderness or lesions, UTERUS: uterus is normal size, shape, consistency and nontender  Rectal: no hemorrhoids  Extremities: no edema       No results found for this or any previous visit (from the past 24 hour(s)).  Assessment & Plan:  1) Postpartum exam 2) 5 wks s/p PLTCS d/t HSV outbreak 3) Bottlefeeding 4) Depression screening 5) Contraception counseling, pt prefers abstinence until IUD insertion. No chance of boyfriend getting out anytime soon. Will wait til 8wk pp for IUD (  3more weeks) d/t c/s  6) Dep/anx> continue Lexapro 20mg , declines counseling, to let us know if worsening/develops SI/HI/II  Meds: No orders of the defined types were placed in this encounter.   Follow-up: Return in about 3 weeks (around 05/04/2018) for IUD insertion.   No orders of the defined types were placed in this encounter.   Cheral MarkerKimberly R Dwon Sky CNM,  [facility]NP-BC 04/13/2018 12:36 PM

## 2018-05-04 ENCOUNTER — Ambulatory Visit: Payer: BLUE CROSS/BLUE SHIELD | Admitting: Women's Health

## 2018-05-16 ENCOUNTER — Ambulatory Visit: Payer: BLUE CROSS/BLUE SHIELD | Admitting: Women's Health

## 2018-05-17 ENCOUNTER — Encounter: Payer: Self-pay | Admitting: Advanced Practice Midwife

## 2018-05-17 ENCOUNTER — Other Ambulatory Visit: Payer: Self-pay

## 2018-05-17 ENCOUNTER — Ambulatory Visit (INDEPENDENT_AMBULATORY_CARE_PROVIDER_SITE_OTHER): Payer: BLUE CROSS/BLUE SHIELD | Admitting: Advanced Practice Midwife

## 2018-05-17 VITALS — BP 123/78 | HR 82 | Ht 67.0 in | Wt 171.0 lb

## 2018-05-17 DIAGNOSIS — Z3202 Encounter for pregnancy test, result negative: Secondary | ICD-10-CM | POA: Diagnosis not present

## 2018-05-17 DIAGNOSIS — Z30430 Encounter for insertion of intrauterine contraceptive device: Secondary | ICD-10-CM

## 2018-05-17 LAB — POCT URINE PREGNANCY: Preg Test, Ur: NEGATIVE

## 2018-05-17 MED ORDER — LEVONORGESTREL 19.5 MCG/DAY IU IUD
INTRAUTERINE_SYSTEM | Freq: Once | INTRAUTERINE | Status: AC
  Administered 2018-05-17: 15:00:00 via INTRAUTERINE

## 2018-05-17 NOTE — Patient Instructions (Signed)

## 2018-05-17 NOTE — Progress Notes (Signed)
Angela Moran is a 28 y.o. year old  female Gravida 1 Para 1  who presents for placement of a Liletta IUD. Her LMP was Monday and her pregnancy test today is negative.    The risks and benefits of the method and placement have been thouroughly reviewed with the patient and all questions were answered.  Specifically the patient is aware of failure rate of 04/998, expulsion of the IUD and of possible perforation.  The patient is aware of irregular bleeding due to the method and understands the incidence of irregular bleeding diminishes with time.  Time out was performed.  A Graves speculum was placed.  The cervix was prepped using Betadine. The uterus was found to be neutral and it sounded to 8 cm.  The cervix was grasped with a tenaculum and the IUD was inserted to 8 cm.  It was pulled back 1 cm and the IUD was disengaged.  The strings were trimmed to 3 cm.  Sonogram was performed and the proper placement of the IUD was verified.  The patient was instructed on signs and symptoms of infection and to check for the strings after each menses or each month.  The patient is to refrain from intercourse for 3 days.  The patient is scheduled for a return appointment after her first menses or 4 weeks.  Jacklyn Shell 05/17/2018 2:34 PM

## 2018-06-14 ENCOUNTER — Ambulatory Visit: Payer: BLUE CROSS/BLUE SHIELD | Admitting: Advanced Practice Midwife

## 2018-06-21 ENCOUNTER — Ambulatory Visit: Payer: BLUE CROSS/BLUE SHIELD | Admitting: Advanced Practice Midwife

## 2018-07-13 ENCOUNTER — Ambulatory Visit: Payer: BLUE CROSS/BLUE SHIELD | Admitting: Physician Assistant

## 2018-08-02 ENCOUNTER — Other Ambulatory Visit: Payer: Self-pay

## 2018-08-02 ENCOUNTER — Ambulatory Visit (INDEPENDENT_AMBULATORY_CARE_PROVIDER_SITE_OTHER): Payer: BLUE CROSS/BLUE SHIELD | Admitting: Physician Assistant

## 2018-08-02 ENCOUNTER — Encounter: Payer: Self-pay | Admitting: Physician Assistant

## 2018-08-02 VITALS — Temp 97.4°F

## 2018-08-02 DIAGNOSIS — F1010 Alcohol abuse, uncomplicated: Secondary | ICD-10-CM | POA: Diagnosis not present

## 2018-08-02 DIAGNOSIS — Z7251 High risk heterosexual behavior: Secondary | ICD-10-CM

## 2018-08-02 NOTE — Progress Notes (Signed)
Virtual Visit via Video Note I connected with Angela Moran on 08/02/18 at  9:00 AM EDT by a video enabled telemedicine application and verified that I am speaking with the correct person using two identifiers.   I discussed the limitations of evaluation and management by telemedicine and the availability of in person appointments. The patient expressed understanding and agreed to proceed.  History of Present Illness: Patient presents via [facility] today to discuss STD testing and assessment of liver. Patient is currently sexually active with 1 female partner. Endorses this is the same partner (father of her son) but they are not together. Knows he is sexually active with other partners and unsure of condom use in those encounters. They do not use protection. Denies any symptoms. She also notes her sexual partner has a history of IV drug abuse. He is not involved in their son's life as she states she cannot trust him.  Patient also requesting liver function testing due to history of alcohol use. Remained abstinent of alcohol use throughout pregnancy. Has restarted drinking in the past couple of months. Denies use of drugs. States she drinks about 4 beers per day and 2-3 shots of liquor. Sometimes more on weekends. Denies any anger when questioned about dringing or any guilt over drinking. Is concerned about drinking as she "knows its not good". Does endorse eye opener once every few weeks to help get rid of hangovers.   Observations/Objective: Patient is well-developed, well-nourished in no acute distress.  Resting comfortably at home.  Head is normocephalic, atraumatic.  No labored breathing.  Speech is clear and coherent with logical contest.  Patient is alert and oriented at baseline.   Assessment and Plan: 1. High risk heterosexual behavior 1 female partner but no protection use. Partner is known to have multiple partners and IV drug use. Patient asymptomatic. 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 Discussed safe sex practices and recommended avoidance of this partner giving his drug use.  Will alter regimen based on results. She is to abstain from any sexual acts until workup is complete and for any necessary treatment. - Acute Hep Panel & Hep B Surface Ab; Future - HIV Antibody (routine testing w rflx); Future - RPR; Future - Urine cytology ancillary only([place]); Future  2. Alcohol abuse 2/4 on CAGE questionnaire. Will have her work on cutting out the liquor for now. Will work on small steps at a time to wean down on this to a healthier level. Will check LFTS, B12 and folate levels.   - Hepatic function panel; Future - B12 and Folate Panel; Future    Follow Up Instructions: Lab appointment scheduled.  Will schedule Video follow-up when calling patient with results.   I discussed the assessment and treatment plan with the patient. The patient was provided an opportunity to ask questions and all were answered. The patient agreed with the plan and demonstrated an understanding of the instructions.   The patient was advised to call back or seek an in-person evaluation if the symptoms worsen or if the condition fails to improve as anticipated.   Piedad Climes, PA-C

## 2018-08-02 NOTE — Progress Notes (Signed)
I have discussed the procedure for the virtual visit with the patient who has given consent to proceed with assessment and treatment.   Kinze Labo S Alejandria Wessells, CMA     

## 2018-08-06 ENCOUNTER — Other Ambulatory Visit: Payer: BLUE CROSS/BLUE SHIELD

## 2018-08-08 ENCOUNTER — Encounter: Payer: Self-pay | Admitting: Physician Assistant

## 2018-08-08 ENCOUNTER — Ambulatory Visit (INDEPENDENT_AMBULATORY_CARE_PROVIDER_SITE_OTHER): Payer: BLUE CROSS/BLUE SHIELD | Admitting: Physician Assistant

## 2018-08-08 ENCOUNTER — Telehealth: Payer: Self-pay | Admitting: Physician Assistant

## 2018-08-08 DIAGNOSIS — R42 Dizziness and giddiness: Secondary | ICD-10-CM | POA: Diagnosis not present

## 2018-08-08 NOTE — Telephone Encounter (Signed)
Recommend video visit to assess further so we can decide (1) how to get her feeling better, (2) if she can still come for lab tomorrow and (3) if any additional labs need to be added

## 2018-08-08 NOTE — Progress Notes (Signed)
   Virtual Visit via Video   I connected with Angela Moran on 08/08/18 at  3:15 PM EDT by a video enabled telemedicine application and verified that I am speaking with the correct person using two identifiers  Location patient: Home Location provider: [facility], Office Persons participating in the virtual visit: Patient and Provider  I discussed the limitations of evaluation and management by telemedicine and the availability of in person appointments. The patient expressed understanding and agreed to proceed.  Subjective:   HPI:   Patient endorses 1.5 days of intermittent lightheadedness, noted mainly with positional change. Denies true dizziness. Notes mild occasional nausea without vomiting. Denies fever, chills, URI symptoms. Denies change to bowel or bladder habits. Denies melena or hematochezia. IUD in place. Notes she has not been hydrating well. Only drinking mild when she has something. Notes appetite is ok -- eating mainly junk food currently. Denies polydipsia, polyphagia. Denies increased stress or anxiety. Denies chest pain, palpitations or SOB. Is due to come in tomorrow morning for STI screen due to high risk sexual behavior. Notes she has know way to check BP currently. Is asymptomatic at present.  ROS: See pertinent positives and negatives per HPI.  Patient Active Problem List   Diagnosis Date Noted  . Alcohol use 04/13/2018  . Status post cesarean section 03/07/2018  . 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) 02/23/2018  . 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 Smoker 12/18/2017  . History of illicit drug use 07/17/2017  . Depression with anxiety 12/04/2013    Social History   Tobacco Use  . Smoking status: Current Every Day Smoker    Packs/day: 1.00    Years: 6.00    Pack years: 6.00    Types: Cigarettes  . Smokeless tobacco: Never Used  Substance Use Topics  . Alcohol use: Yes    Comment: 4 beers daily, 2-4 shots daily     Current Outpatient Medications:  .  escitalopram (LEXAPRO) 20 MG tablet, Take 1 tablet (20 mg total) by mouth daily., Disp: 30 tablet, Rfl: 3 .  loratadine (CLARITIN) 10 MG tablet, Take 10 mg by mouth daily as needed for allergies., Disp: , Rfl:  .  valACYclovir (VALTREX) 1000 MG tablet, Take 1 tablet (1,000 mg total) by mouth 2 (two) times daily., Disp: 10 tablet, Rfl: 0  No Known Allergies  Objective:   There were no vitals taken for this visit.  Patient is well-developed, well-nourished in no acute distress.  Resting comfortably at home.  Head is normocephalic, atraumatic.  No labored breathing.  Speech is clear and coherent with logical contest.  Patient is alert and oriented at baseline.    Assessment and Plan:   1. Episodic lightheadedness Unable to assess BP or heart rate presently. Asymptomatic at present. Question mild anemia and orthostasis giving history. She is coming in tomorrow for labs already. Will add on CBC, CMP, Iron level. Will have BP and OVS checked by CMA at that time to further assess. Recommend she watch her diet but increase fluids. Limit alcohol. Strict ER precautions reviewed with patient.     Piedad Climes, PA-C 08/08/2018

## 2018-08-08 NOTE — Telephone Encounter (Signed)
Female answered the phone, he will have her to call back

## 2018-08-08 NOTE — Telephone Encounter (Signed)
Pt called in stating that she has been feeling dizzy and nauseas. She wanted to make you aware of this. She had a lab appt scheduled for tomorrow.

## 2018-08-09 ENCOUNTER — Other Ambulatory Visit (INDEPENDENT_AMBULATORY_CARE_PROVIDER_SITE_OTHER): Payer: BLUE CROSS/BLUE SHIELD

## 2018-08-09 ENCOUNTER — Other Ambulatory Visit (HOSPITAL_COMMUNITY)
Admission: RE | Admit: 2018-08-09 | Discharge: 2018-08-09 | Disposition: A | Discharge status: Home / Self Care | Payer: BLUE CROSS/BLUE SHIELD | Source: Ambulatory Visit | Attending: Physician Assistant | Admitting: Physician Assistant

## 2018-08-09 DIAGNOSIS — F1010 Alcohol abuse, uncomplicated: Secondary | ICD-10-CM | POA: Diagnosis not present

## 2018-08-09 DIAGNOSIS — R42 Dizziness and giddiness: Secondary | ICD-10-CM | POA: Diagnosis not present

## 2018-08-09 DIAGNOSIS — Z7251 High risk heterosexual behavior: Secondary | ICD-10-CM | POA: Insufficient documentation

## 2018-08-09 LAB — CBC WITH DIFFERENTIAL/PLATELET
Basophils Absolute: 0 10*3/uL (ref 0.0–0.1)
Basophils Relative: 0.8 % (ref 0.0–3.0)
Eosinophils Absolute: 0.4 10*3/uL (ref 0.0–0.7)
Eosinophils Relative: 6 % — ABNORMAL HIGH (ref 0.0–5.0)
HCT: 43.9 % (ref 36.0–46.0)
Hemoglobin: 14.9 g/dL (ref 12.0–15.0)
Lymphocytes Relative: 17.1 % (ref 12.0–46.0)
Lymphs Abs: 1.1 10*3/uL (ref 0.7–4.0)
MCHC: 33.9 g/dL (ref 30.0–36.0)
MCV: 91.2 fl (ref 78.0–100.0)
Monocytes Absolute: 0.5 10*3/uL (ref 0.1–1.0)
Monocytes Relative: 8.2 % (ref 3.0–12.0)
Neutro Abs: 4.2 10*3/uL (ref 1.4–7.7)
Neutrophils Relative %: 67.9 % (ref 43.0–77.0)
Platelets: 240 10*3/uL (ref 150.0–400.0)
RBC: 4.81 Mil/uL (ref 3.87–5.11)
RDW: 15.4 % (ref 11.5–15.5)
WBC: 6.2 10*3/uL (ref 4.0–10.5)

## 2018-08-09 LAB — COMPREHENSIVE METABOLIC PANEL
ALT: 18 U/L (ref 0–35)
AST: 20 U/L (ref 0–37)
Albumin: 4.6 g/dL (ref 3.5–5.2)
Alkaline Phosphatase: 43 U/L (ref 39–117)
BUN: 12 mg/dL (ref 6–23)
CO2: 29 mEq/L (ref 19–32)
Calcium: 10 mg/dL (ref 8.4–10.5)
Chloride: 103 mEq/L (ref 96–112)
Creatinine, Ser: 0.81 mg/dL (ref 0.40–1.20)
GFR: 83.83 mL/min (ref >60.00)
Glucose, Bld: 71 mg/dL (ref 70–99)
Potassium: 4.6 mEq/L (ref 3.5–5.1)
Sodium: 139 mEq/L (ref 135–145)
Total Bilirubin: 1 mg/dL (ref 0.2–1.2)
Total Protein: 7.2 g/dL (ref 6.0–8.3)

## 2018-08-09 LAB — B12 AND FOLATE PANEL
Folate: 11.3 ng/mL (ref >5.9)
Vitamin B-12: 341 pg/mL (ref 211–911)

## 2018-08-09 LAB — IRON: Iron: 208 ug/dL — ABNORMAL HIGH (ref 42–145)

## 2018-08-11 LAB — REFLEX TIQ

## 2018-08-11 LAB — RPR: RPR Ser Ql: NONREACTIVE

## 2018-08-11 LAB — ACUTE HEP PANEL AND HEP B SURFACE AB
HEPATITIS C ANTIBODY REFILL$(REFL): NONREACTIVE
Hep A IgM: NONREACTIVE
Hep B C IgM: NONREACTIVE
Hepatitis B Surface Ag: NONREACTIVE
SIGNAL TO CUT-OFF: 0.01 (ref <1.00)

## 2018-08-11 LAB — HIV ANTIBODY (ROUTINE TESTING W REFLEX): HIV 1&2 Ab, 4th Generation: NONREACTIVE

## 2018-08-11 LAB — URINE CYTOLOGY ANCILLARY ONLY
Chlamydia: NEGATIVE
Neisseria Gonorrhea: NEGATIVE
Trichomonas: NEGATIVE

## 2018-08-13 ENCOUNTER — Encounter: Payer: Self-pay | Admitting: Physician Assistant

## 2018-08-13 ENCOUNTER — Other Ambulatory Visit: Payer: Self-pay | Admitting: Physician Assistant

## 2018-08-13 DIAGNOSIS — R42 Dizziness and giddiness: Secondary | ICD-10-CM

## 2018-08-13 DIAGNOSIS — R790 Abnormal level of blood mineral: Secondary | ICD-10-CM

## 2018-08-14 NOTE — Progress Notes (Signed)
This encounter was created in error - please disregard.

## 2018-08-16 ENCOUNTER — Telehealth: Payer: Self-pay | Admitting: Physician Assistant

## 2018-08-16 NOTE — Telephone Encounter (Signed)
SW patient regarding symptoms.  Patient reports right arm pain and tingling x 2 weeks. Noticed more after venipuncture here on 08/06/2018.  Patient able to hold and grasp objects without difficulty. She is also able to raise and extend arm. Patient concerned of blood clot.   Patient also c/o pounding heart beat x 2 weeks. States she continues to drink ETOH and smoke.   Scheduled for virtual visit with PCP on 08/17/2018.

## 2018-08-16 NOTE — Telephone Encounter (Signed)
Pt states that she had labs done here 4/13 and that her arm is tingling, has shooting pains at times, and was wonding if she needed to do anything. Pt states that she was stuck twice and this was from the 1st attempt to collect her blood. Please advise.

## 2018-08-17 ENCOUNTER — Encounter: Payer: BLUE CROSS/BLUE SHIELD | Admitting: Physician Assistant

## 2018-08-17 NOTE — Progress Notes (Signed)
I have discussed the procedure for the virtual visit with the patient who has given consent to proceed with assessment and treatment.   Iyan Flett, CMA     

## 2018-08-19 NOTE — Progress Notes (Signed)
This encounter was created in error - please disregard.

## 2018-09-18 ENCOUNTER — Encounter: Payer: Self-pay | Admitting: Physician Assistant

## 2018-09-18 ENCOUNTER — Other Ambulatory Visit: Payer: Self-pay

## 2018-09-18 ENCOUNTER — Ambulatory Visit (INDEPENDENT_AMBULATORY_CARE_PROVIDER_SITE_OTHER): Payer: BLUE CROSS/BLUE SHIELD | Admitting: Physician Assistant

## 2018-09-18 DIAGNOSIS — B3731 Acute candidiasis of vulva and vagina: Secondary | ICD-10-CM

## 2018-09-18 DIAGNOSIS — B373 Candidiasis of vulva and vagina: Secondary | ICD-10-CM | POA: Diagnosis not present

## 2018-09-18 MED ORDER — FLUCONAZOLE 150 MG PO TABS: 150.0000 mg | ORAL_TABLET | Freq: Once | ORAL | 0 refills | Status: AC

## 2018-09-18 NOTE — Progress Notes (Signed)
I have discussed the procedure for the virtual visit with the patient who has given consent to proceed with assessment and treatment.   Truth Wolaver S Clema Skousen, CMA     

## 2018-09-18 NOTE — Progress Notes (Signed)
   Virtual Visit via Video   I connected with patient on 09/18/18 at  8:40 AM EDT by a video enabled telemedicine application and verified that I am speaking with the correct person using two identifiers.  Location patient: Home Location provider: [facility], Office Persons participating in the virtual visit: Patient, Provider, CMA (Patina Moore)  I discussed the limitations of evaluation and management by telemedicine and the availability of in person appointments. The patient expressed understanding and agreed to proceed.  Subjective:   HPI:   Patient presents to clinic today c/o 2 days of vaginal itching with a thick, cheesy discharge. Denies recent antibiotic use. Denies change to soaps, lotions or detergents. Has had sex prior to start of symptoms with her long-standing partner. Denies concern for STI. Denies fever, chills, malaise or fatigue.  ROS:   See pertinent positives and negatives per HPI.  Patient Active Problem List   Diagnosis Date Noted  . Alcohol use 04/13/2018  . Status post cesarean section 03/07/2018  . 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) 02/23/2018  . 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 Smoker 12/18/2017  . History of illicit drug use 07/17/2017  . Depression with anxiety 12/04/2013    Social History   Tobacco Use  . Smoking status: Current Every Day Smoker    Packs/day: 1.00    Years: 6.00    Pack years: 6.00    Types: Cigarettes  . Smokeless tobacco: Never Used  Substance Use Topics  . Alcohol use: Yes    Comment: 4 beers daily, 2-4 shots daily    Current Outpatient Medications:  .  loratadine (CLARITIN) 10 MG tablet, Take 10 mg by mouth daily as needed for allergies., Disp: , Rfl:  .  escitalopram (LEXAPRO) 20 MG tablet, Take 1 tablet (20 mg total) by mouth daily. (Patient not taking: Reported on 09/18/2018), Disp: 30 tablet, Rfl: 3 .  valACYclovir (VALTREX) 1000 MG tablet, Take 1 tablet (1,000 mg total) by mouth 2  (two) times daily. (Patient not taking: Reported on 09/18/2018), Disp: 10 tablet, Rfl: 0  No Known Allergies  Objective:   Breastfeeding No   Patient is well-developed, well-nourished in no acute distress.  Resting comfortably in car (parked).  Head is normocephalic, atraumatic.  No labored breathing.  Speech is clear and coherent with logical contest.  Patient is alert and oriented at baseline.   Assessment and Plan:   1. Yeast vaginitis Symptoms and history classic for yeast. Rx Diflucan. Supportive measures reviewed. Follow-up in office if not improving as we will need exam and testing at that point.  - fluconazole (DIFLUCAN) 150 MG tablet; Take 1 tablet (150 mg total) by mouth once for 1 dose. Repeat in 3 days.  Dispense: 2 tablet; Refill: 0    Piedad Climes, [place] 09/18/2018

## 2018-09-28 DIAGNOSIS — M7989 Other specified soft tissue disorders: Secondary | ICD-10-CM | POA: Diagnosis not present

## 2018-09-28 DIAGNOSIS — S62664A Nondisplaced fracture of distal phalanx of right ring finger, initial encounter for closed fracture: Secondary | ICD-10-CM | POA: Diagnosis not present

## 2018-12-12 ENCOUNTER — Telehealth: Payer: Self-pay | Admitting: Emergency Medicine

## 2018-12-12 ENCOUNTER — Ambulatory Visit (HOSPITAL_COMMUNITY)
Admission: EM | Admit: 2018-12-12 | Discharge: 2018-12-12 | Disposition: A | Discharge status: Home / Self Care | Payer: BC Managed Care – PPO | Attending: Family Medicine | Admitting: Family Medicine

## 2018-12-12 ENCOUNTER — Ambulatory Visit: Payer: Self-pay | Admitting: Physician Assistant

## 2018-12-12 ENCOUNTER — Other Ambulatory Visit: Payer: Self-pay

## 2018-12-12 ENCOUNTER — Encounter (HOSPITAL_COMMUNITY): Payer: Self-pay | Admitting: Emergency Medicine

## 2018-12-12 DIAGNOSIS — M545 Low back pain: Secondary | ICD-10-CM | POA: Diagnosis not present

## 2018-12-12 DIAGNOSIS — Z818 Family history of other mental and behavioral disorders: Secondary | ICD-10-CM | POA: Insufficient documentation

## 2018-12-12 DIAGNOSIS — R197 Diarrhea, unspecified: Secondary | ICD-10-CM | POA: Insufficient documentation

## 2018-12-12 DIAGNOSIS — Z8379 Family history of other diseases of the digestive system: Secondary | ICD-10-CM | POA: Insufficient documentation

## 2018-12-12 DIAGNOSIS — R1030 Lower abdominal pain, unspecified: Secondary | ICD-10-CM | POA: Insufficient documentation

## 2018-12-12 DIAGNOSIS — R5383 Other fatigue: Secondary | ICD-10-CM | POA: Insufficient documentation

## 2018-12-12 DIAGNOSIS — Z20828 Contact with and (suspected) exposure to other viral communicable diseases: Secondary | ICD-10-CM | POA: Insufficient documentation

## 2018-12-12 DIAGNOSIS — F1014 Alcohol abuse with alcohol-induced mood disorder: Secondary | ICD-10-CM

## 2018-12-12 DIAGNOSIS — Z811 Family history of alcohol abuse and dependence: Secondary | ICD-10-CM | POA: Insufficient documentation

## 2018-12-12 DIAGNOSIS — Z87898 Personal history of other specified conditions: Secondary | ICD-10-CM

## 2018-12-12 DIAGNOSIS — R569 Unspecified convulsions: Secondary | ICD-10-CM | POA: Insufficient documentation

## 2018-12-12 DIAGNOSIS — F1010 Alcohol abuse, uncomplicated: Secondary | ICD-10-CM

## 2018-12-12 DIAGNOSIS — R112 Nausea with vomiting, unspecified: Secondary | ICD-10-CM | POA: Diagnosis not present

## 2018-12-12 DIAGNOSIS — F17210 Nicotine dependence, cigarettes, uncomplicated: Secondary | ICD-10-CM | POA: Insufficient documentation

## 2018-12-12 DIAGNOSIS — Z833 Family history of diabetes mellitus: Secondary | ICD-10-CM | POA: Insufficient documentation

## 2018-12-12 DIAGNOSIS — R5381 Other malaise: Secondary | ICD-10-CM | POA: Insufficient documentation

## 2018-12-12 DIAGNOSIS — F1991 Other psychoactive substance use, unspecified, in remission: Secondary | ICD-10-CM

## 2018-12-12 DIAGNOSIS — R630 Anorexia: Secondary | ICD-10-CM | POA: Insufficient documentation

## 2018-12-12 LAB — COMPREHENSIVE METABOLIC PANEL
ALT: 18 U/L (ref 0–44)
AST: 17 U/L (ref 15–41)
Albumin: 4.3 g/dL (ref 3.5–5.0)
Alkaline Phosphatase: 38 U/L (ref 38–126)
Anion gap: 10 (ref 5–15)
BUN: 12 mg/dL (ref 6–20)
CO2: 24 mmol/L (ref 22–32)
Calcium: 10 mg/dL (ref 8.9–10.3)
Chloride: 103 mmol/L (ref 98–111)
Creatinine, Ser: 0.76 mg/dL (ref 0.44–1.00)
GFR calc Af Amer: >60 mL/min (ref >60)
GFR calc non Af Amer: >60 mL/min (ref >60)
Glucose, Bld: 101 mg/dL — ABNORMAL HIGH (ref 70–99)
Potassium: 4.3 mmol/L (ref 3.5–5.1)
Sodium: 137 mmol/L (ref 135–145)
Total Bilirubin: 1.1 mg/dL (ref 0.3–1.2)
Total Protein: 7.1 g/dL (ref 6.5–8.1)

## 2018-12-12 LAB — CBC
HCT: 44.5 % (ref 36.0–46.0)
Hemoglobin: 15.2 g/dL — ABNORMAL HIGH (ref 12.0–15.0)
MCH: 33 pg (ref 26.0–34.0)
MCHC: 34.2 g/dL (ref 30.0–36.0)
MCV: 96.5 fL (ref 80.0–100.0)
Platelets: 237 10*3/uL (ref 150–400)
RBC: 4.61 MIL/uL (ref 3.87–5.11)
RDW: 13 % (ref 11.5–15.5)
WBC: 6.3 10*3/uL (ref 4.0–10.5)
nRBC: 0 % (ref 0.0–0.2)

## 2018-12-12 LAB — LIPASE, BLOOD: Lipase: 28 U/L (ref 11–51)

## 2018-12-12 NOTE — ED Provider Notes (Signed)
MRN: [id number] DOB: Jun 10, 1990  Subjective:   Angela Moran is a 28 y.o. female with past medical history of depression, anxiety, illicit drug use, cocaine abuse, alcohol abuse presenting for 1.5 week history of nausea with diarrhea, lower abdominal pain.  Belly pain is intermittent and cramp-like sensation of the lower abdomen.  Has had lower back pain a couple of times in the past week. 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  She has never been able to quit drinking. Her last drink was ~16:00 yesterday. Smokes ~1/2ppd. Denies any other drug use. Has not been eating either, decreased appetite. Has not tried any medications for relief. Has never had rehab. Has hx of 1 seizure due to drug use. Reached out to her PCP but they advised she come here for COVID 19 r/o and general work up.    No current facility-administered medications for this encounter.   Current Outpatient Medications:  .  loratadine (CLARITIN) 10 MG tablet, Take 10 mg by mouth daily as needed for allergies., Disp: , Rfl:  .  valACYclovir (VALTREX) 1000 MG tablet, Take 1 tablet (1,000 mg total) by mouth 2 (two) times daily. (Patient not taking: Reported on 09/18/2018), Disp: 10 tablet, Rfl: 0    No Known Allergies   Past Medical History:  Diagnosis Date  . Depression    doing ok  . HSV-2 infection   . Infection    UTI  . Pregnant   . Seizures ([place]) 2014   related to drug use   . Vaginal Pap smear, abnormal    clear on repeat     Past Surgical History:  Procedure Laterality Date  . CESAREAN SECTION N/A 03/07/2018   Procedure: REPEAT CESAREAN SECTION;  Surgeon: Truett Mainland, DO;  Location: [facility];  Service: Obstetrics;  Laterality: N/A;  . NO PAST SURGERIES      Review of Systems  Constitutional: Negative for fever and malaise/fatigue.  HENT: Negative for congestion, ear pain, sinus pain and sore throat.   Eyes: Negative for blurred vision, double  vision, discharge and redness.  Respiratory: Negative for cough, hemoptysis, shortness of breath and wheezing.   Cardiovascular: Negative for chest pain.  Gastrointestinal: Positive for abdominal pain (lower abdomen), diarrhea, nausea and vomiting (dry heaving). Negative for blood in stool and constipation.  Genitourinary: Negative for dysuria, flank pain and hematuria.  Musculoskeletal: Positive for back pain (low back). Negative for myalgias.  Skin: Negative for rash.  Neurological: Negative for dizziness, weakness and headaches.  Psychiatric/Behavioral: Negative for depression and substance abuse.    Family History  Problem Relation Age of Onset  . Other Mother        Bone deteriation  . Alcohol abuse Mother   . Cirrhosis Father   . Alcohol abuse Father   . Diabetes Brother   . Alcohol abuse Maternal Grandmother   . Alcohol abuse Maternal Grandfather   . Alcohol abuse Paternal Grandmother   . Alcohol abuse Paternal Grandfather   . Autism Brother   . ADD / ADHD Brother      Objective:   Vitals: BP 124/84 (BP Location: Right Arm)   Pulse 64   Temp 98.5 F (36.9 C) (Oral)   Resp 18   SpO2 99%   Physical Exam Constitutional:      General: She is not in acute distress.    Appearance: Normal appearance. She is well-developed and normal weight. She is not ill-appearing,  toxic-appearing or diaphoretic.  HENT:     Head: Normocephalic and atraumatic.     Right Ear: External ear normal.     Left Ear: External ear normal.     Nose: Nose normal.     Mouth/Throat:     Mouth: Mucous membranes are moist.     Pharynx: Oropharynx is clear.  Eyes:     General: No scleral icterus.    Extraocular Movements: Extraocular movements intact.     Pupils: Pupils are equal, round, and reactive to light.  Cardiovascular:     Rate and Rhythm: Normal rate and regular rhythm.     Pulses: Normal pulses.     Heart sounds: Normal heart sounds. No murmur. No friction rub. No gallop.    Pulmonary:     Effort: Pulmonary effort is normal. No respiratory distress.     Breath sounds: Normal breath sounds. No stridor. No wheezing, rhonchi or rales.  Abdominal:     General: Bowel sounds are normal. There is no distension.     Palpations: Abdomen is soft. There is no mass.     Tenderness: There is abdominal tenderness (Mild over mid to lower abdominal area). There is no right CVA tenderness, left CVA tenderness, guarding or rebound.  Skin:    General: Skin is warm and dry.     Coloration: Skin is not pale.     Findings: No rash.  Neurological:     General: No focal deficit present.     Mental Status: She is alert and oriented to person, place, and time.     Cranial Nerves: No cranial nerve deficit.     Motor: No weakness.     Coordination: Coordination normal.     Deep Tendon Reflexes: Reflexes normal (No hyper or hyporeflexia).     Comments: No asterixis, tremor noted.  Psychiatric:        Mood and Affect: Mood normal.        Behavior: Behavior normal.        Thought Content: Thought content normal.        Judgment: Judgment normal.     Assessment and Plan :   1. Alcohol abuse   2. Nausea vomiting and diarrhea   3. Lower abdominal pain   4. History of seizure   5. Family history of alcohol abuse   6. History of illicit drug use   7. Decreased appetite   8. Malaise and fatigue     Physical exam findings reassuring, labs pending.  Will redirect patient to the emergency room pending lab results.  Otherwise emphasized need to contact an outpatient rehab facility to start helping her immediately with her alcohol abuse and dependence.  Patient can otherwise follow-up with PCP, COVID-19 labs pending. Counseled patient on potential for adverse effects with medications prescribed/recommended today, ER and return-to-clinic precautions discussed, patient verbalized understanding.    Wallis BambergMani, Loyalty Arentz, PA-C 12/12/18 1419

## 2018-12-12 NOTE — Telephone Encounter (Signed)
Giving GI symptoms and concern of COVID we cannot see her in office but she needs assessment today that includes examination, urine testing and labs, specifically her blood count and functions of her liver and pancreas. I am concerned for alcoholic gastritis/pancreatitis or hepatitis. Recommend Urgent Care evaluation.

## 2018-12-12 NOTE — ED Triage Notes (Signed)
Pt here for vomiting; pt sts drinks heavily daily

## 2018-12-12 NOTE — Telephone Encounter (Signed)
Spoke with patient in details of her symptoms Patient states her symptoms started 1-2 weeks ago. Since an increase in alcohol (liquor, beer, wine) intake she has been having nausea, diarrhea and abdominal pain.  She states she doesn't feel good. Complains of back pain-unsure if possible kidney infection. Decreased appetite, she does drink water and green tea Patient states she will miss work today and will need a work note.

## 2018-12-12 NOTE — Telephone Encounter (Signed)
Advised patient of PCP recommendations. She is agreeable and will go to [facility].  We want to make sure to rule out COVID due to her GI symptoms and to rule out liver or pancreatitis.

## 2018-12-12 NOTE — Discharge Instructions (Addendum)
[facility] in [place], [place] Address: [address] [place], [place], [place] [address] [phone]

## 2018-12-14 LAB — NOVEL CORONAVIRUS, NAA (HOSP ORDER, SEND-OUT TO REF LAB; TAT 18-24 HRS): SARS-CoV-2, NAA: NOT DETECTED

## 2018-12-26 ENCOUNTER — Other Ambulatory Visit: Payer: Self-pay

## 2018-12-26 ENCOUNTER — Ambulatory Visit
Admission: EM | Admit: 2018-12-26 | Discharge: 2018-12-26 | Disposition: A | Discharge status: Home / Self Care | Payer: Self-pay | Attending: Emergency Medicine | Admitting: Emergency Medicine

## 2018-12-26 ENCOUNTER — Encounter: Payer: Self-pay | Admitting: Emergency Medicine

## 2018-12-26 DIAGNOSIS — F17210 Nicotine dependence, cigarettes, uncomplicated: Secondary | ICD-10-CM

## 2018-12-26 DIAGNOSIS — R059 Cough, unspecified: Secondary | ICD-10-CM

## 2018-12-26 DIAGNOSIS — J029 Acute pharyngitis, unspecified: Secondary | ICD-10-CM

## 2018-12-26 DIAGNOSIS — Z1159 Encounter for screening for other viral diseases: Secondary | ICD-10-CM

## 2018-12-26 DIAGNOSIS — R05 Cough: Secondary | ICD-10-CM

## 2018-12-26 MED ORDER — BENZONATATE 100 MG PO CAPS: 100.0000 mg | ORAL_CAPSULE | Freq: Three times a day (TID) | ORAL | 0 refills | Status: DC

## 2018-12-26 MED ORDER — LIDOCAINE VISCOUS HCL 2 % MT SOLN: 15.0000 mL | OROMUCOSAL | 0 refills | Status: DC | PRN

## 2018-12-26 NOTE — Discharge Instructions (Signed)
Your COVID test is pending: Is important you quarantine at home until your results are back. °You may take OTC Tylenol for fever and myalgias.  It is important to drink plenty of water throughout the day to stay hydrated. °If you test positive and later develop severe fever, cough, or shortness of breath, it is recommended that you go to the ER for further evaluation. °

## 2018-12-26 NOTE — ED Notes (Signed)
Patient able to ambulate independently  

## 2018-12-26 NOTE — ED Provider Notes (Signed)
[facility]    CSN: [id number] Arrival date & time: 12/26/18  1625      History   Chief Complaint Chief Complaint  Patient presents with  . Cough    HPI Angela Moran is a 28 y.o. female with history of depression, alcohol abuse presenting for 3-day course of sore throat, productive, non-hemoptic cough, hot flashes, loss of taste and smell.  Patient states she noticed single episode of green mucus the other day with tiny blood streaks, has not happened again since.  Patient tried OTC cough syrup with minimal relief.  Patient was seen on 8/19 for abdominal pain, record reviewed by me: Tested negative for COVID on 8/19.  States that she has been working on decreasing how much she is drinking (has discontinued liquor use), though did not go to detox/rehab as recommended by medical provider at that time.  Denies headache, agitation, tremors, seizure.    Past Medical History:  Diagnosis Date  . Depression    doing ok  . HSV-2 infection   . Infection    UTI  . Pregnant   . Seizures ([place]) 2014   related to drug use   . Vaginal Pap smear, abnormal    clear on repeat    Patient Active Problem List   Diagnosis Date Noted  . Alcohol use 04/13/2018  . Status post cesarean section 03/07/2018  . Cocaine abuse complicating pregnancy in third trimester ([place]) 02/23/2018  . 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 Smoker 12/18/2017  . History of illicit drug use 36/14/4315  . Depression with anxiety 12/04/2013    Past Surgical History:  Procedure Laterality Date  . CESAREAN SECTION N/A 03/07/2018   Procedure: REPEAT CESAREAN SECTION;  Surgeon: Truett Mainland, DO;  Location: [facility];  Service: Obstetrics;  Laterality: N/A;  . NO PAST SURGERIES      OB History    Gravida  1   Para  1   Term  1   Preterm  0   AB  0   Living  1     SAB  0   TAB  0   Ectopic  0   Multiple  0   Live Births  1            Home Medications    Prior to  Admission medications   Medication Sig Start Date End Date Taking? Authorizing Provider  benzonatate (TESSALON) 100 MG capsule Take 1 capsule (100 mg total) by mouth every 8 (eight) hours. 12/26/18   Hall-Potvin, [place], PA-C  lidocaine (XYLOCAINE) 2 % solution Use as directed 15 mLs in the mouth or throat as needed for mouth pain. 12/26/18   Hall-Potvin, [place], PA-C  loratadine (CLARITIN) 10 MG tablet Take 10 mg by mouth daily as needed for allergies.    [provider]  valACYclovir (VALTREX) 1000 MG tablet Take 1 tablet (1,000 mg total) by mouth 2 (two) times daily. Patient not taking: Reported on 09/18/2018 02/25/18   Chancy Milroy, MD    Family History Family History  Problem Relation Age of Onset  . Other Mother        Bone deteriation  . Alcohol abuse Mother   . Cirrhosis Father   . Alcohol abuse Father   . Diabetes Brother   . Alcohol abuse Maternal Grandmother   . Alcohol abuse Maternal Grandfather   . Alcohol abuse Paternal Grandmother   . Alcohol abuse Paternal Grandfather   . Autism Brother   .  ADD / ADHD Brother     Social History Social History   Tobacco Use  . Smoking status: Current Every Day Smoker    Packs/day: 0.50    Years: 6.00    Pack years: 3.00    Types: Cigarettes  . Smokeless tobacco: Never Used  Substance Use Topics  . Alcohol use: Yes    Comment: 4 beers daily, 2-4 shots daily  . Drug use: Not Currently    Types: Marijuana, Cocaine, Heroin    Comment: last used last week     Allergies   Patient has no known allergies.   Review of Systems Review of Systems  Constitutional: Negative for fatigue and fever.  HENT: Positive for sore throat. Negative for congestion, dental problem, ear pain, facial swelling, hearing loss, sinus pain, trouble swallowing and voice change.   Eyes: Negative for photophobia, pain, redness and visual disturbance.  Respiratory: Positive for cough. Negative for shortness of breath.   Cardiovascular:  Negative for chest pain and palpitations.  Gastrointestinal: Negative for abdominal pain, diarrhea, nausea and vomiting.  Musculoskeletal: Negative for arthralgias and myalgias.  Skin: Negative for rash and wound.  Neurological: Negative for dizziness, syncope and headaches.     Physical Exam Triage Vital Signs ED Triage Vitals  Enc Vitals Group     BP 12/26/18 1635 118/79     Pulse Rate 12/26/18 1635 90     Resp 12/26/18 1635 18     Temp 12/26/18 1635 97.8 F (36.6 C)     Temp Source 12/26/18 1635 Oral     SpO2 12/26/18 1635 98 %     Weight --      Height --      Head Circumference --      Peak Flow --      Pain Score 12/26/18 1636 4     Pain Loc --      Pain Edu? --      Excl. in GC? --    No data found.  Updated Vital Signs BP 118/79 (BP Location: Left Arm)   Pulse 90   Temp 97.8 F (36.6 C) (Oral)   Resp 18   SpO2 98%    Physical Exam Constitutional:      General: She is not in acute distress.    Appearance: She is not ill-appearing.  HENT:     Head: Normocephalic and atraumatic.     Jaw: There is normal jaw occlusion. No tenderness or pain on movement.     Right Ear: Hearing, tympanic membrane, ear canal and external ear normal. No tenderness. No mastoid tenderness.     Left Ear: Hearing, tympanic membrane, ear canal and external ear normal. No tenderness. No mastoid tenderness.     Nose: Nose normal. No nasal deformity, septal deviation or nasal tenderness.     Right Turbinates: Not swollen or pale.     Left Turbinates: Not swollen or pale.     Right Sinus: No maxillary sinus tenderness or frontal sinus tenderness.     Left Sinus: No maxillary sinus tenderness or frontal sinus tenderness.     Mouth/Throat:     Lips: Pink. No lesions.     Mouth: Mucous membranes are moist. No injury.     Pharynx: Oropharynx is clear. Uvula midline. No posterior oropharyngeal erythema or uvula swelling.     Comments: no tonsillar exudate or hypertrophy Eyes:     General:  No scleral icterus.    Pupils: Pupils are equal, round, and reactive  to light.  Neck:     Musculoskeletal: Normal range of motion and neck supple. No muscular tenderness.  Cardiovascular:     Rate and Rhythm: Normal rate and regular rhythm.     Heart sounds: No murmur. No gallop.   Pulmonary:     Effort: Pulmonary effort is normal. No respiratory distress.     Breath sounds: No wheezing, rhonchi or rales.  Musculoskeletal: Normal range of motion.     Right lower leg: No edema.     Left lower leg: No edema.  Lymphadenopathy:     Cervical: No cervical adenopathy.  Skin:    Capillary Refill: Capillary refill takes less than 2 seconds.     Coloration: Skin is not jaundiced or pale.  Neurological:     Mental Status: She is alert and oriented to person, place, and time.      UC Treatments / Results  Labs (all labs ordered are listed, but only abnormal results are displayed) Labs Reviewed  NOVEL CORONAVIRUS, NAA    EKG   Radiology No results found.  Procedures Procedures (including critical care time)  Medications Ordered in UC Medications - No data to display  Initial Impression / Assessment and Plan / UC Course  I have reviewed the triage vital signs and the nursing notes.  Pertinent labs & imaging results that were available during my care of the patient were reviewed by me and considered in my medical decision making (see chart for details).     1.  Cough Short duration, afebrile, no adventitious sounds on exam, reported SOB: Radiography deferred.  Low concern for pneumonia at this time, likely viral process.  COVID test pending: work note provided.  2.  Sore throat Centor score:0, strep test deferred.  Will monitor for now, treat supportively with viscous lidocaine.  Return precautions discussed, patient verbalized understanding and is agreeable to plan. Final Clinical Impressions(s) / UC Diagnoses   Final diagnoses:  Cough  Sore throat     Discharge  Instructions     Your COVID test is pending: Is important you quarantine at home until your results are back. You may take OTC Tylenol for fever and myalgias.  It is important to drink plenty of water throughout the day to stay hydrated. If you test positive and later develop severe fever, cough, or shortness of breath, it is recommended that you go to the ER for further evaluation.    ED Prescriptions    Medication Sig Dispense Auth. Provider   lidocaine (XYLOCAINE) 2 % solution Use as directed 15 mLs in the mouth or throat as needed for mouth pain. 100 mL Hall-Potvin, [place], PA-C   benzonatate (TESSALON) 100 MG capsule Take 1 capsule (100 mg total) by mouth every 8 (eight) hours. 21 capsule Hall-Potvin, [place], PA-C     Controlled Substance Prescriptions [place] Controlled Substance Registry consulted? Not Applicable   Shea EvansHall-Potvin, , [place] JerseyPA-C 12/26/18 1907

## 2018-12-26 NOTE — ED Triage Notes (Signed)
Pt presents to [facility] for assessment of 3 days of sore throat, cough, hot flashes, green mucous with blood tinges from time to time, and loss of sense taste and smell

## 2018-12-27 ENCOUNTER — Telehealth: Payer: Self-pay | Admitting: Emergency Medicine

## 2018-12-27 LAB — NOVEL CORONAVIRUS, NAA: SARS-CoV-2, NAA: NOT DETECTED

## 2018-12-29 NOTE — Telephone Encounter (Signed)
Unable to leave message for check in.  Voicemail not set up.

## 2018-12-31 ENCOUNTER — Telehealth (HOSPITAL_COMMUNITY): Payer: Self-pay | Admitting: Emergency Medicine

## 2018-12-31 NOTE — Telephone Encounter (Signed)
Pt left voicemail asking about test results. Returned call and reported results as negative in VM.

## 2019-02-01 ENCOUNTER — Other Ambulatory Visit: Payer: Self-pay

## 2019-02-01 ENCOUNTER — Emergency Department (HOSPITAL_COMMUNITY)
Admission: EM | Admit: 2019-02-01 | Discharge: 2019-02-02 | Disposition: A | Discharge status: Home / Self Care | Payer: BC Managed Care – PPO | Attending: Emergency Medicine | Admitting: Emergency Medicine

## 2019-02-01 DIAGNOSIS — F17210 Nicotine dependence, cigarettes, uncomplicated: Secondary | ICD-10-CM | POA: Insufficient documentation

## 2019-02-01 DIAGNOSIS — T401X1A Poisoning by heroin, accidental (unintentional), initial encounter: Secondary | ICD-10-CM

## 2019-02-01 DIAGNOSIS — T402X1A Poisoning by other opioids, accidental (unintentional), initial encounter: Secondary | ICD-10-CM | POA: Insufficient documentation

## 2019-02-01 DIAGNOSIS — R030 Elevated blood-pressure reading, without diagnosis of hypertension: Secondary | ICD-10-CM

## 2019-02-01 LAB — I-STAT BETA HCG BLOOD, ED (MC, WL, AP ONLY): I-stat hCG, quantitative: <5 m[IU]/mL (ref <5)

## 2019-02-01 LAB — CBG MONITORING, ED: Glucose-Capillary: 95 mg/dL (ref 70–99)

## 2019-02-01 MED ORDER — LORAZEPAM 1 MG PO TABS
0.0000 mg | ORAL_TABLET | Freq: Four times a day (QID) | ORAL | Status: DC
  Administered 2019-02-01: 1 mg via ORAL
  Filled 2019-02-01: qty 1

## 2019-02-01 MED ORDER — LORAZEPAM 2 MG/ML IJ SOLN: 0.0000 mg | Freq: Four times a day (QID) | INTRAMUSCULAR | Status: DC

## 2019-02-01 MED ORDER — LORAZEPAM 2 MG/ML IJ SOLN: 0.0000 mg | Freq: Two times a day (BID) | INTRAMUSCULAR | Status: DC

## 2019-02-01 MED ORDER — LORAZEPAM 1 MG PO TABS: 0.0000 mg | ORAL_TABLET | Freq: Two times a day (BID) | ORAL | Status: DC

## 2019-02-01 MED ORDER — SODIUM CHLORIDE 0.9 % IV SOLN: INTRAVENOUS | Status: DC

## 2019-02-01 MED ORDER — SODIUM CHLORIDE 0.9 % IV BOLUS
1000.0000 mL | Freq: Once | INTRAVENOUS | Status: AC
  Administered 2019-02-02: 1000 mL via INTRAVENOUS

## 2019-02-01 MED ORDER — THIAMINE HCL 100 MG/ML IJ SOLN: 100.0000 mg | Freq: Every day | INTRAMUSCULAR | Status: DC

## 2019-02-01 MED ORDER — VITAMIN B-1 100 MG PO TABS: 100.0000 mg | ORAL_TABLET | Freq: Every day | ORAL | Status: DC

## 2019-02-01 NOTE — ED Provider Notes (Signed)
[facility]-EMERGENCY DEPT Provider Note   CSN: [id number] Arrival date & time: 02/01/19  2214     History   Chief Complaint Chief Complaint  Patient presents with  . Drug Overdose    HPI Angela Moran is a 28 y.o. female with a past medical history significant for depression, drug induced seizures, polysubstance abuse, and alcohol abuse who presents to the ED via EMS after a multiple drug overdose. Patient was found in the [employer] parking lot unresponsive. Patient was given 2 mg of Narcan and bagged for ventilation. Once EMS arrived, patient became alert and was able to breath on her own. Patient states she has been using heroin everyday for the past month, but switched to Ice (meth) 4 days ago as a way to try and withdraw from heroin. Today, patient felt very upset and drank almost a full 750 mL bottle of rum, smoked meth, and snorted heroin. She is unsure if it was laced with anything, but assumes it was because she has never overdosed before. Patient drinks daily, but has recently cut back over the past month to just 1 beer or 2 shots daily. Patient denies suicidal ideations. She denies injecting drugs. Patient is currently sexually active with numerous partners, but states she uses protection each time. Patient denies vaginal symptoms, but admits to dysuria for the past few days. She has an IUD and does not get periods. Patient states she is experiencing a bilateral frontal headache that she states typically happens after using drugs. Patient notes headache is non-radiating and constant. Patient denies vision changes, nausea, vomiting, abdominal pain, chest pain, palpitations, and shortness of breath.  Past Medical History:  Diagnosis Date  . Depression    doing ok  . HSV-2 infection   . Infection    UTI  . Pregnant   . 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 Vaginal Pap smear, abnormal    clear on repeat    Patient Active Problem List   Diagnosis  Date Noted  . Alcohol use 04/13/2018  . Status post cesarean section 03/07/2018  . 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) 02/23/2018  . 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 Smoker 12/18/2017  . History of illicit drug use 07/17/2017  . Depression with anxiety 12/04/2013    Past Surgical History:  Procedure Laterality Date  . CESAREAN SECTION N/A 03/07/2018   Procedure: REPEAT CESAREAN SECTION;  Surgeon: Levie Heritage, DO;  [facility]: [facility];  Service: Obstetrics;  Laterality: N/A;  . NO PAST SURGERIES       OB History    Gravida  1   Para  1   Term  1   Preterm  0   AB  0   Living  1     SAB  0   TAB  0   Ectopic  0   Multiple  0   Live Births  1            Home Medications    Prior to Admission medications   Medication Sig Start Date End Date Taking? Authorizing Provider  benzonatate (TESSALON) 100 MG capsule Take 1 capsule (100 mg total) by mouth every 8 (eight) hours. Patient not taking: Reported on 02/01/2019 12/26/18   Hall-Potvin, [place], PA-C  cephALEXin (KEFLEX) 500 MG capsule Take 1 capsule (500 mg total) by mouth 2 (two) times daily for 7 days. 02/02/19 02/09/19  Cheek, Vesta Mixer, PA-C  lidocaine (XYLOCAINE)  2 % solution Use as directed 15 mLs in the mouth or throat as needed for mouth pain. Patient not taking: Reported on 02/01/2019 12/26/18   Hall-Potvin, [place], PA-C  valACYclovir (VALTREX) 1000 MG tablet Take 1 tablet (1,000 mg total) by mouth 2 (two) times daily. Patient not taking: Reported on 09/18/2018 02/25/18   Hermina Staggers, MD    Family History Family History  Problem Relation Age of Onset  . Other Mother        Bone deteriation  . Alcohol abuse Mother   . Cirrhosis Father   . Alcohol abuse Father   . Diabetes Brother   . Alcohol abuse Maternal Grandmother   . Alcohol abuse Maternal Grandfather   . Alcohol abuse Paternal Grandmother   . Alcohol abuse Paternal Grandfather   . Autism Brother    . ADD / ADHD Brother     Social History Social History   Tobacco Use  . Smoking status: Current Every Day Smoker    Packs/day: 0.50    Years: 6.00    Pack years: 3.00    Types: Cigarettes  . Smokeless tobacco: Never Used  Substance Use Topics  . Alcohol use: Yes    Comment: 4 beers daily, 2-4 shots daily  . Drug use: Not Currently    Types: Marijuana, Cocaine, Heroin    Comment: last used last week     Allergies   Patient has no known allergies.   Review of Systems Review of Systems  Constitutional: Negative for chills and fever.  Respiratory: Negative for shortness of breath.   Cardiovascular: Negative for chest pain and palpitations.  Genitourinary: Positive for dysuria.  Musculoskeletal: Negative for neck stiffness.  Skin: Negative for color change and wound.  Neurological: Positive for tremors and headaches. Negative for dizziness, seizures, speech difficulty, weakness, light-headedness and numbness.  All other systems reviewed and are negative.    Physical Exam Updated Vital Signs BP (!) 130/98 (BP Location: Right Arm)   Pulse (!) 104   Temp 97.9 F (36.6 C) (Oral)   Resp 16   SpO2 96%   Physical Exam Vitals signs and nursing note reviewed.  Constitutional:      General: She is not in acute distress.    Appearance: She is not ill-appearing.  HENT:     Head: Normocephalic.  Eyes:     Pupils: Pupils are equal, round, and reactive to light.  Neck:     Musculoskeletal: Neck supple.  Cardiovascular:     Rate and Rhythm: Regular rhythm. Tachycardia present.     Pulses: Normal pulses.     Heart sounds: Normal heart sounds. No murmur. No friction rub. No gallop.   Pulmonary:     Effort: Pulmonary effort is normal. No respiratory distress.     Breath sounds: Normal breath sounds. No wheezing or rales.     Comments: Non labored breathing. Clear to auscultation bilaterally. Abdominal:     General: Abdomen is flat. Bowel sounds are normal. There is no  distension.     Palpations: Abdomen is soft.  Musculoskeletal: Normal range of motion.  Skin:    General: Skin is warm and dry.     Comments: No injection marks noted in cupital fossa or in neck.  Neurological:     General: No focal deficit present.     Mental Status: She is alert.     Cranial Nerves: No cranial nerve deficit.     Comments: Patient does have noticeable full body twitching, more  pronounced in upper extremities. Non-slurred speech. Able to follow commands.      ED Treatments / Results  Labs (all labs ordered are listed, but only abnormal results are displayed) Labs Reviewed  COMPREHENSIVE METABOLIC PANEL - Abnormal; Notable for the following components:      Result Value   Glucose, Bld 108 (*)    All other components within normal limits  ACETAMINOPHEN LEVEL - Abnormal; Notable for the following components:   Acetaminophen (Tylenol), Serum <10 (*)    All other components within normal limits  ETHANOL - Abnormal; Notable for the following components:   Alcohol, Ethyl (B) 160 (*)    All other components within normal limits  RAPID URINE DRUG SCREEN, HOSP PERFORMED - Abnormal; Notable for the following components:   Amphetamines POSITIVE (*)    All other components within normal limits  CBC WITH DIFFERENTIAL/PLATELET - Abnormal; Notable for the following components:   Hemoglobin 15.2 (*)    HCT 48.1 (*)    All other components within normal limits  URINALYSIS, ROUTINE W REFLEX MICROSCOPIC - Abnormal; Notable for the following components:   APPearance HAZY (*)    Hgb urine dipstick SMALL (*)    Nitrite POSITIVE (*)    Leukocytes,Ua SMALL (*)    Bacteria, UA FEW (*)    Non Squamous Epithelial 11-20 (*)    All other components within normal limits  SALICYLATE LEVEL  CBG MONITORING, ED  I-STAT BETA HCG BLOOD, ED ([facility], [facility], [facility] ONLY)    EKG EKG Interpretation  Date/Time:  Friday February 01 2019 23:22:01 EDT Ventricular Rate:  108 PR Interval:    QRS  Duration: 84 QT Interval:  338 QTC Calculation: 453 R Axis:   75 Text Interpretation:  Sinus tachycardia Abnormal ECG Confirmed by Gerhard MunchLockwood, Robert [phone]([id number]) on 02/01/2019 11:35:33 PM   Radiology No results found.  Procedures Procedures (including critical care time)  Medications Ordered in ED Medications  sodium chloride 0.9 % bolus 1,000 mL (0 mLs Intravenous Stopped 02/02/19 0213)  ondansetron (ZOFRAN-ODT) disintegrating tablet 4 mg (4 mg Oral Given 02/02/19 0213)  naloxone (NARCAN) nasal spray 4 mg/0.1 mL (0.4 mg Nasal Provided for home use 02/02/19 0249)     Initial Impression / Assessment and Plan / ED Course  I have reviewed the triage vital signs and the nursing notes.  Pertinent labs & imaging results that were available during my care of the patient were reviewed by me and considered in my medical decision making (see chart for details).  Angela EvenerCallie Robley is a 28 year old female who presents to the ED via EMS after an accidental overdose. Patient is mildly tachycardic at 104, elevated BP of 130/98. Patient is not hypoxic. On physical exam, patient has non-labored breathing, is non-toxic appearing, and is twitching occasionally, more significant in the upper extremity. Neurological exam was unremarkable, pupils are reactive to light. Will continue to monitor BP readings throughout patient's stay.  Due to patient's alcohol history, will place patient on CIWA protocol. 1L bolus will be given. Routine labs of thiamine, acetaminophen level, CBC, CMP, ethanol, Salicylate level, UA, and UDS. EKG will be obtained. Will observe patient for a few hours due to short lasting effect of Narcan.  EKG demonstrated sinus tachycardia with no signs of ischemia. UDS positive for amphetamines. UA displayed sign of UTI with positive nitrites and leukocytes. Will treat with Keflex. Patient instructed to finish all antibiotics. Patient also given Narcan to take home with her. Patient is discharged home in  no acute distress and vitals within normal limit. Strict ED return precautions discussed with patient. Patient states understanding and agrees to plan. Patient also received community resources for drug and alcohol help.  Final Clinical Impressions(s) / ED Diagnoses   Final diagnoses:  Accidental overdose of heroin, initial encounter ([place])  Elevated blood pressure reading    ED Discharge Orders         Ordered    cephALEXin (KEFLEX) 500 MG capsule  2 times daily     02/02/19 0239           Romie Levee 40/34/74 2595    Delora Fuel, MD 63/87/56 [phone]

## 2019-02-01 NOTE — ED Triage Notes (Signed)
Per EMS, Pt was found in at a [employer] parking lot unresponsive due to overdose. Pt has been trying to stop taking Heroin over that last 4 days and decided to switch to "Ice" (meth) because "it is a quick bump" to help her get going. Pt smoked Ice this morning, consumed amount of alcohol then relapsed and snorted heroin that is suspected to be lased with unknown substance. When police arrived pt was given 2mg  of narcan, and fire bagged pt. On EMS arrival pt was able to breath and became alert.

## 2019-02-02 LAB — URINALYSIS, ROUTINE W REFLEX MICROSCOPIC
Bilirubin Urine: NEGATIVE
Glucose, UA: NEGATIVE mg/dL
Ketones, ur: NEGATIVE mg/dL
Nitrite: POSITIVE — AB
Protein, ur: NEGATIVE mg/dL
Specific Gravity, Urine: 1.011 (ref 1.005–1.030)
pH: 6 (ref 5.0–8.0)

## 2019-02-02 LAB — CBC WITH DIFFERENTIAL/PLATELET
Abs Immature Granulocytes: 0.02 10*3/uL (ref 0.00–0.07)
Basophils Absolute: 0.1 10*3/uL (ref 0.0–0.1)
Basophils Relative: 1 %
Eosinophils Absolute: 0.2 10*3/uL (ref 0.0–0.5)
Eosinophils Relative: 3 %
HCT: 48.1 % — ABNORMAL HIGH (ref 36.0–46.0)
Hemoglobin: 15.2 g/dL — ABNORMAL HIGH (ref 12.0–15.0)
Immature Granulocytes: 0 %
Lymphocytes Relative: 23 %
Lymphs Abs: 1.6 10*3/uL (ref 0.7–4.0)
MCH: 31.4 pg (ref 26.0–34.0)
MCHC: 31.6 g/dL (ref 30.0–36.0)
MCV: 99.4 fL (ref 80.0–100.0)
Monocytes Absolute: 0.6 10*3/uL (ref 0.1–1.0)
Monocytes Relative: 9 %
Neutro Abs: 4.5 10*3/uL (ref 1.7–7.7)
Neutrophils Relative %: 64 %
Platelets: 275 10*3/uL (ref 150–400)
RBC: 4.84 MIL/uL (ref 3.87–5.11)
RDW: 12.6 % (ref 11.5–15.5)
WBC: 7.1 10*3/uL (ref 4.0–10.5)
nRBC: 0 % (ref 0.0–0.2)

## 2019-02-02 LAB — RAPID URINE DRUG SCREEN, HOSP PERFORMED
Amphetamines: POSITIVE — AB
Barbiturates: NOT DETECTED
Benzodiazepines: NOT DETECTED
Cocaine: NOT DETECTED
Opiates: NOT DETECTED
Tetrahydrocannabinol: NOT DETECTED

## 2019-02-02 LAB — COMPREHENSIVE METABOLIC PANEL
ALT: 23 U/L (ref 0–44)
AST: 23 U/L (ref 15–41)
Albumin: 4.7 g/dL (ref 3.5–5.0)
Alkaline Phosphatase: 46 U/L (ref 38–126)
Anion gap: 9 (ref 5–15)
BUN: 8 mg/dL (ref 6–20)
CO2: 27 mmol/L (ref 22–32)
Calcium: 9.7 mg/dL (ref 8.9–10.3)
Chloride: 108 mmol/L (ref 98–111)
Creatinine, Ser: 0.73 mg/dL (ref 0.44–1.00)
GFR calc Af Amer: >60 mL/min (ref >60)
GFR calc non Af Amer: >60 mL/min (ref >60)
Glucose, Bld: 108 mg/dL — ABNORMAL HIGH (ref 70–99)
Potassium: 3.7 mmol/L (ref 3.5–5.1)
Sodium: 144 mmol/L (ref 135–145)
Total Bilirubin: 0.3 mg/dL (ref 0.3–1.2)
Total Protein: 7.9 g/dL (ref 6.5–8.1)

## 2019-02-02 LAB — SALICYLATE LEVEL: Salicylate Lvl: <7 mg/dL (ref 2.8–30.0)

## 2019-02-02 LAB — ETHANOL: Alcohol, Ethyl (B): 160 mg/dL — ABNORMAL HIGH (ref <10)

## 2019-02-02 LAB — ACETAMINOPHEN LEVEL: Acetaminophen (Tylenol), Serum: <10 ug/mL — ABNORMAL LOW (ref 10–30)

## 2019-02-02 MED ORDER — ONDANSETRON 4 MG PO TBDP
4.0000 mg | ORAL_TABLET | Freq: Once | ORAL | Status: AC
  Administered 2019-02-02: 4 mg via ORAL
  Filled 2019-02-02: qty 1

## 2019-02-02 MED ORDER — CEPHALEXIN 500 MG PO CAPS: 500.0000 mg | ORAL_CAPSULE | Freq: Two times a day (BID) | ORAL | 0 refills | Status: AC

## 2019-02-02 MED ORDER — NALOXONE HCL 4 MG/0.1ML NA LIQD
0.4000 mg | Freq: Once | NASAL | Status: AC
  Administered 2019-02-02: 0.4 mg via NASAL

## 2019-02-02 MED ORDER — NALOXONE HCL 4 MG/0.1ML NA LIQD
NASAL | Status: AC
  Filled 2019-02-02: qty 4

## 2019-02-02 NOTE — Discharge Instructions (Addendum)
RESOURCE GUIDE Behavioral Health Resources in [facility]  Intensive Outpatient Programs: [facility]      [address] [place], [place] [phone] Both a day and evening program       [facility]     [address]        [place], [place] [address] [phone]         ADS: [facility] [address] [place] [phone]  [facility] ACCESS LINE: [phone] or [phone] [address] [place], [place] [address] EntrepreneurLoan.co.[email or web address]/[email or web address]   Substance Abuse Resources: [facility] Drug Services  [phone] [facility] [phone] [facility] [phone] [facility] [phone] Residential & Outpatient Substance Abuse Program  [phone]  Psychological Services: [facility]  [phone] [facility]  [phone] [facility], [phone] [place] [address], [place], ACCESS LINE: [phone] or [phone], EntrepreneurLoan.co.[email or web address]/adult.htm  Mobile Crisis Teams:                                        [facility]rapeutic Alternatives         Mobile Crisis Care Unit [phone]             [facility] [address] [place] [phone]                                         Interventionist [address] De[address]sch [address] [place] [phone]  Self-Help/Support Groups: Mental Health Assoc. of [facility] [place] Variety of support groups [phone] (call for more info)  Narcotics Anonymous (NA) Caring Services [address] [place] - 2 meetings at this location  Residential Treatment Programs:  [facility]      [address]        [place]       [phone]         [facility] [address], [place] [address] [place], [place]  [phone]  [facility]  [address] [place], [place] [address] [phone] Admissions: 8am-3pm M-F  [facility]     [address]        [place], [place] [address]       [phone]         [facility] [address] [place], [place] [phone]  [facility] [address] [place], [place] [phone]  [facility] - Intensive Outpatient      [address]     [place], [place]       [phone]         [facility]     [address] [place], [place] [phone] or [phone]  [facility]idential Treatment Services (RTS), Medicaid [address] [place], [place] [phone]  [facility] [address]                                               [address] [place] [phone]  [facility] Resources: Therapist, [facility]CenterPoint [facility] [phone]- [phone]  General Therapy                                                Angie Fava, PhD        [address] [place], [place] [address]         [phone]   Insurance  [facility] Behavioral   [address] [place], [place] [address] [phone]  [facility] [address] [place], [place] [address] [phone] Insurance/Medicaid/sponsorship through [facility] and Families                                              [address]                                        [place], [place] [address]    Therapy/tele-psych/case         [phone]          [facility] [address], [place]  [address]  Adolescent/group home/case management [phone]                                           Creola Corn PhD       General therapy       Insurance   [phone]         Dr. Lolly Mustache, Insurance, M-F [phone]  [facility]  [employer] [facility] Dept. [address]                 [address]         [address] [place] [address]    Blondell Reveal Phone:  [phone]                                  Phone:  [phone]                   Phone:  [phone]  [facility], [phone] [facility]- [phone]       -     [facility] in [place], [address],             [phone], Insurance   Above are some community  resources for you. I am also writing you a prescription for Narcan. Please return to the ED if you experience chest pain, shortness of breath, or extreme weakness. You also have a UTI, so I have sent in a prescription for medication. Please take as prescribed and finished all antibiotics.

## 2019-02-02 NOTE — ED Notes (Signed)
Pt given soup and crackers.

## 2019-02-05 ENCOUNTER — Telehealth: Payer: Self-pay

## 2019-02-05 NOTE — Telephone Encounter (Signed)
Would recommend that she contact the health department or [facility] and Wellness for assessment since cost will not be an issue there. Has she looked into any treatment programs. I know the ER noted they gave her resources in the community.

## 2019-02-05 NOTE — Telephone Encounter (Signed)
Finally spoke with patient after multiple attempts. Offered appt at PCP request. Stated she does not have insurance and unable to pay self pay OV price. Informed patient I would route message to PCP,

## 2019-02-05 NOTE — Telephone Encounter (Signed)
LMOVM advising patient to reach out the [facility] and Wellness or health department. Addresses and phone numbers provided.

## 2019-02-09 ENCOUNTER — Emergency Department (HOSPITAL_COMMUNITY): Payer: Self-pay

## 2019-02-09 ENCOUNTER — Encounter (HOSPITAL_COMMUNITY): Payer: Self-pay | Admitting: Emergency Medicine

## 2019-02-09 ENCOUNTER — Other Ambulatory Visit: Payer: Self-pay

## 2019-02-09 ENCOUNTER — Emergency Department (HOSPITAL_COMMUNITY)
Admission: EM | Admit: 2019-02-09 | Discharge: 2019-02-09 | Disposition: A | Discharge status: Home / Self Care | Payer: Self-pay | Attending: Emergency Medicine | Admitting: Emergency Medicine

## 2019-02-09 DIAGNOSIS — F17210 Nicotine dependence, cigarettes, uncomplicated: Secondary | ICD-10-CM | POA: Insufficient documentation

## 2019-02-09 DIAGNOSIS — M791 Myalgia, unspecified site: Secondary | ICD-10-CM | POA: Insufficient documentation

## 2019-02-09 DIAGNOSIS — R52 Pain, unspecified: Secondary | ICD-10-CM

## 2019-02-09 DIAGNOSIS — R350 Frequency of micturition: Secondary | ICD-10-CM | POA: Insufficient documentation

## 2019-02-09 DIAGNOSIS — Z20828 Contact with and (suspected) exposure to other viral communicable diseases: Secondary | ICD-10-CM | POA: Insufficient documentation

## 2019-02-09 LAB — CBC WITH DIFFERENTIAL/PLATELET
Abs Immature Granulocytes: 0.02 10*3/uL (ref 0.00–0.07)
Basophils Absolute: 0 10*3/uL (ref 0.0–0.1)
Basophils Relative: 0 %
Eosinophils Absolute: 0.2 10*3/uL (ref 0.0–0.5)
Eosinophils Relative: 2 %
HCT: 47.3 % — ABNORMAL HIGH (ref 36.0–46.0)
Hemoglobin: 15.1 g/dL — ABNORMAL HIGH (ref 12.0–15.0)
Immature Granulocytes: 0 %
Lymphocytes Relative: 8 %
Lymphs Abs: 0.8 10*3/uL (ref 0.7–4.0)
MCH: 31.9 pg (ref 26.0–34.0)
MCHC: 31.9 g/dL (ref 30.0–36.0)
MCV: 99.8 fL (ref 80.0–100.0)
Monocytes Absolute: 0.9 10*3/uL (ref 0.1–1.0)
Monocytes Relative: 9 %
Neutro Abs: 8.6 10*3/uL — ABNORMAL HIGH (ref 1.7–7.7)
Neutrophils Relative %: 81 %
Platelets: 229 10*3/uL (ref 150–400)
RBC: 4.74 MIL/uL (ref 3.87–5.11)
RDW: 12.2 % (ref 11.5–15.5)
WBC: 10.5 10*3/uL (ref 4.0–10.5)
nRBC: 0 % (ref 0.0–0.2)

## 2019-02-09 LAB — URINALYSIS, ROUTINE W REFLEX MICROSCOPIC
Bilirubin Urine: NEGATIVE
Glucose, UA: 150 mg/dL — AB
Hgb urine dipstick: NEGATIVE
Ketones, ur: NEGATIVE mg/dL
Nitrite: NEGATIVE
Protein, ur: NEGATIVE mg/dL
Specific Gravity, Urine: 1.018 (ref 1.005–1.030)
WBC, UA: >50 WBC/hpf — ABNORMAL HIGH (ref 0–5)
pH: 5 (ref 5.0–8.0)

## 2019-02-09 LAB — COMPREHENSIVE METABOLIC PANEL
ALT: 132 U/L — ABNORMAL HIGH (ref 0–44)
AST: 89 U/L — ABNORMAL HIGH (ref 15–41)
Albumin: 4.1 g/dL (ref 3.5–5.0)
Alkaline Phosphatase: 144 U/L — ABNORMAL HIGH (ref 38–126)
Anion gap: 9 (ref 5–15)
BUN: 6 mg/dL (ref 6–20)
CO2: 24 mmol/L (ref 22–32)
Calcium: 9.4 mg/dL (ref 8.9–10.3)
Chloride: 102 mmol/L (ref 98–111)
Creatinine, Ser: 0.78 mg/dL (ref 0.44–1.00)
GFR calc Af Amer: >60 mL/min (ref >60)
GFR calc non Af Amer: >60 mL/min (ref >60)
Glucose, Bld: 157 mg/dL — ABNORMAL HIGH (ref 70–99)
Potassium: 4.1 mmol/L (ref 3.5–5.1)
Sodium: 135 mmol/L (ref 135–145)
Total Bilirubin: 0.9 mg/dL (ref 0.3–1.2)
Total Protein: 7.5 g/dL (ref 6.5–8.1)

## 2019-02-09 LAB — LACTIC ACID, PLASMA: Lactic Acid, Venous: 1.6 mmol/L (ref 0.5–1.9)

## 2019-02-09 LAB — I-STAT BETA HCG BLOOD, ED (MC, WL, AP ONLY): I-stat hCG, quantitative: <5 m[IU]/mL (ref <5)

## 2019-02-09 LAB — SARS CORONAVIRUS 2 (TAT 6-24 HRS): SARS Coronavirus 2: NEGATIVE

## 2019-02-09 MED ORDER — KETOROLAC TROMETHAMINE 30 MG/ML IJ SOLN
30.0000 mg | Freq: Once | INTRAMUSCULAR | Status: AC
  Administered 2019-02-09: 13:00:00 30 mg via INTRAVENOUS
  Filled 2019-02-09: qty 1

## 2019-02-09 MED ORDER — SODIUM CHLORIDE 0.9 % IV BOLUS
1000.0000 mL | Freq: Once | INTRAVENOUS | Status: AC
  Administered 2019-02-09: 12:00:00 1000 mL via INTRAVENOUS

## 2019-02-09 MED ORDER — IBUPROFEN 800 MG PO TABS: 800.0000 mg | ORAL_TABLET | Freq: Three times a day (TID) | ORAL | 0 refills | Status: DC | PRN

## 2019-02-09 MED ORDER — PROMETHAZINE HCL 25 MG PO TABS: 25.0000 mg | ORAL_TABLET | Freq: Four times a day (QID) | ORAL | 0 refills | Status: DC | PRN

## 2019-02-09 NOTE — ED Triage Notes (Signed)
C/o fever, chills, generalized body aches, and fatigue x 2 days.  States she has frequent urination and pain with urination.  Stopped using heroin 3 days ago and thinks she may be having withdrawals.

## 2019-02-09 NOTE — Discharge Instructions (Addendum)
Return here as needed.  Rest as much as possible and increase your fluid intake.  Your Covid test is pending and I would suggest quarantine until these results have returned.

## 2019-02-09 NOTE — ED Notes (Signed)
Pt transported to xray department

## 2019-02-09 NOTE — ED Notes (Signed)
Pt dc'd home w/all belongings, a/o x4, ambulatory on dc  

## 2019-02-09 NOTE — ED Provider Notes (Signed)
[facility] EMERGENCY DEPARTMENT Provider Note   CSN: [id number] Arrival date & time: 02/09/19  0109     History   Chief Complaint Chief Complaint  Patient presents with  . Fever  . Generalized Body Aches    HPI Angela Moran is a 28 y.o. female.     HPI Patient presents to the emergency department with general body aches that started 3 days ago.  The patient states she last used methamphetamines 3 days ago.  She states she would inject.  The patient states that she also has been having some cough as well.  Patient states that she is unsure if it is caused by the heroin withdrawal or what not but she states that she has been having frequent urination as well.  Patient states that the body aches are causing her to have trouble getting out of bed because of the discomfort.  The patient denies chest pain, shortness of breath, headache,blurred vision, neck pain, fever, cough, weakness, numbness, dizziness, anorexia, edema, abdominal pain, nausea, vomiting, diarrhea, rash, back pain, dysuria, hematemesis, bloody stool, near syncope, or syncope. Past Medical History:  Diagnosis Date  . Depression    doing ok  . HSV-2 infection   . Infection    UTI  . Pregnant   . Seizures ([place]) 2014   related to drug use   . Vaginal Pap smear, abnormal    clear on repeat    Patient Active Problem List   Diagnosis Date Noted  . Alcohol use 04/13/2018  . Status post cesarean section 03/07/2018  . Cocaine abuse complicating pregnancy in third trimester ([place]) 02/23/2018  . 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 Smoker 12/18/2017  . History of illicit drug use 32/35/5732  . Depression with anxiety 12/04/2013    Past Surgical History:  Procedure Laterality Date  . CESAREAN SECTION N/A 03/07/2018   Procedure: REPEAT CESAREAN SECTION;  Surgeon: Truett Mainland, DO;  Location: [facility];  Service: Obstetrics;  Laterality: N/A;  . NO PAST SURGERIES       OB History    Gravida  1   Para  1   Term  1   Preterm  0   AB  0   Living  1     SAB  0   TAB  0   Ectopic  0   Multiple  0   Live Births  1            Home Medications    Prior to Admission medications   Medication Sig Start Date End Date Taking? Authorizing Provider  benzonatate (TESSALON) 100 MG capsule Take 1 capsule (100 mg total) by mouth every 8 (eight) hours. Patient not taking: Reported on 02/01/2019 12/26/18   Hall-Potvin, [place], PA-C  cephALEXin (KEFLEX) 500 MG capsule Take 1 capsule (500 mg total) by mouth 2 (two) times daily for 7 days. 02/02/19 02/09/19  Cheek, [place]er Locket, PA-C  ibuprofen (ADVIL) 800 MG tablet Take 1 tablet (800 mg total) by mouth every 8 (eight) hours as needed. 02/09/19   Keshav Winegar, Harrell Gave, PA-C  lidocaine (XYLOCAINE) 2 % solution Use as directed 15 mLs in the mouth or throat as needed for mouth pain. Patient not taking: Reported on 02/01/2019 12/26/18   Hall-Potvin, [place], PA-C  promethazine (PHENERGAN) 25 MG tablet Take 1 tablet (25 mg total) by mouth every 6 (six) hours as needed for nausea or vomiting. 02/09/19   Ulice Follett, Harrell Gave, PA-C  valACYclovir (VALTREX) 1000 MG tablet Take  1 tablet (1,000 mg total) by mouth 2 (two) times daily. Patient not taking: Reported on 09/18/2018 02/25/18   Hermina StaggersErvin, Michael L, MD    Family History Family History  Problem Relation Age of Onset  . Other Mother        Bone deteriation  . Alcohol abuse Mother   . Cirrhosis Father   . Alcohol abuse Father   . Diabetes Brother   . Alcohol abuse Maternal Grandmother   . Alcohol abuse Maternal Grandfather   . Alcohol abuse Paternal Grandmother   . Alcohol abuse Paternal Grandfather   . Autism Brother   . ADD / ADHD Brother     Social History Social History   Tobacco Use  . Smoking status: Current Every Day Smoker    Packs/day: 0.50    Years: 6.00    Pack years: 3.00    Types: Cigarettes  . Smokeless tobacco: Never Used  Substance Use Topics  .  Alcohol use: Yes    Comment: 4 beers daily, 2-4 shots daily  . Drug use: Not Currently    Types: Marijuana, Cocaine, Heroin    Comment: last used last week     Allergies   Patient has no known allergies.   Review of Systems Review of Systems All other systems negative except as documented in the HPI. All pertinent positives and negatives as reviewed in the HPI.  Physical Exam Updated Vital Signs BP 114/72 (BP Location: Right Arm)   Pulse 88   Temp 99.5 F (37.5 C) (Oral)   Resp 16   Ht 5\' 7"  (1.702 m)   Wt 70.3 kg   SpO2 99%   BMI 24.28 kg/m   Physical Exam Vitals signs and nursing note reviewed.  Constitutional:      General: She is not in acute distress.    Appearance: She is well-developed.  HENT:     Head: Normocephalic and atraumatic.  Eyes:     Pupils: Pupils are equal, round, and reactive to light.  Neck:     Musculoskeletal: Normal range of motion and neck supple.  Cardiovascular:     Rate and Rhythm: Normal rate and regular rhythm.     Heart sounds: Normal heart sounds. No murmur. No friction rub. No gallop.   Pulmonary:     Effort: Pulmonary effort is normal. No respiratory distress.     Breath sounds: Normal breath sounds. No wheezing.  Abdominal:     General: Bowel sounds are normal. There is no distension.     Palpations: Abdomen is soft.     Tenderness: There is no abdominal tenderness.  Skin:    General: Skin is warm and dry.     Capillary Refill: Capillary refill takes less than 2 seconds.     Findings: No erythema or rash.  Neurological:     Mental Status: She is alert and oriented to person, place, and time.     Motor: No abnormal muscle tone.     Coordination: Coordination normal.  Psychiatric:        Behavior: Behavior normal.      ED Treatments / Results  Labs (all labs ordered are listed, but only abnormal results are displayed) Labs Reviewed  URINALYSIS, ROUTINE W REFLEX MICROSCOPIC - Abnormal; Notable for the following  components:      Result Value   APPearance HAZY (*)    Glucose, UA 150 (*)    Leukocytes,Ua SMALL (*)    WBC, UA >50 (*)    Bacteria,  UA FEW (*)    Non Squamous Epithelial 0-5 (*)    All other components within normal limits  COMPREHENSIVE METABOLIC PANEL - Abnormal; Notable for the following components:   Glucose, Bld 157 (*)    AST 89 (*)    ALT 132 (*)    Alkaline Phosphatase 144 (*)    All other components within normal limits  CBC WITH DIFFERENTIAL/PLATELET - Abnormal; Notable for the following components:   Hemoglobin 15.1 (*)    HCT 47.3 (*)    Neutro Abs 8.6 (*)    All other components within normal limits  SARS CORONAVIRUS 2 (TAT 6-24 HRS)  LACTIC ACID, PLASMA  I-STAT BETA HCG BLOOD, ED ([facility], [facility], [facility] ONLY)    EKG None  Radiology Dg Chest 2 View  Result Date: 02/09/2019 CLINICAL DATA:  Cough, fever, chills EXAM: CHEST - 2 VIEW COMPARISON:  10/16/2007 FINDINGS: The heart size and mediastinal contours are within normal limits. Both lungs are clear. The visualized skeletal structures are unremarkable. IMPRESSION: No active cardiopulmonary disease. Electronically Signed   By: Judie Petit.  Shick M.D.   On: 02/09/2019 10:53    Procedures Procedures (including critical care time)  Medications Ordered in ED Medications  sodium chloride 0.9 % bolus 1,000 mL (0 mLs Intravenous Stopped 02/09/19 1401)  ketorolac (TORADOL) 30 MG/ML injection 30 mg (30 mg Intravenous Given 02/09/19 1238)     Initial Impression / Assessment and Plan / ED Course  I have reviewed the triage vital signs and the nursing notes.  Pertinent labs & imaging results that were available during my care of the patient were reviewed by me and considered in my medical decision making (see chart for details).      Patient has been given IV fluids and further pain control here in the emergency department.  I feel that she could be going through withdrawal plus respiratory illness.  Did a Covid test and that will  come back in 24 to 48 hours.  Told the patient to quarantine and increase her fluid intake.  Told to return here as needed.  Final Clinical Impressions(s) / ED Diagnoses   Final diagnoses:  Body aches    ED Discharge Orders         Ordered    ibuprofen (ADVIL) 800 MG tablet  Every 8 hours PRN     02/09/19 1404    promethazine (PHENERGAN) 25 MG tablet  Every 6 hours PRN     02/09/19 1404           Charlestine Night, PA-C 02/10/19 1513    Jacalyn Lefevre, MD 02/10/19 1528

## 2019-02-11 ENCOUNTER — Telehealth: Payer: Self-pay

## 2019-02-11 NOTE — Telephone Encounter (Signed)
Helped pt sign into MyChart.

## 2019-06-09 IMAGING — US US ABDOMEN LIMITED
1 series · 14 of 25 positions shown · non-contrast
Comparison: None.

CLINICAL DATA: Elevated liver enzymes

EXAM:
ULTRASOUND ABDOMEN LIMITED RIGHT UPPER QUADRANT

[Series 1: us abdomen limited · 0.18mm/px · 14 of 40 slices shown]
[im 1/40]
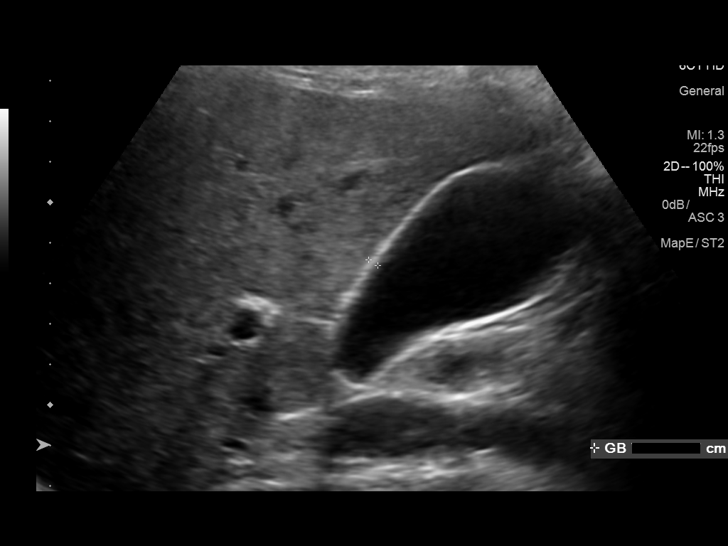
[im 4/40]
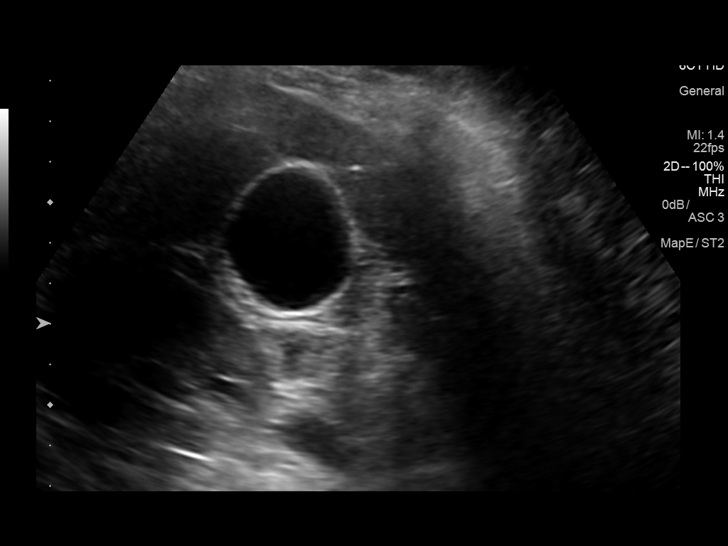
[im 7/40]
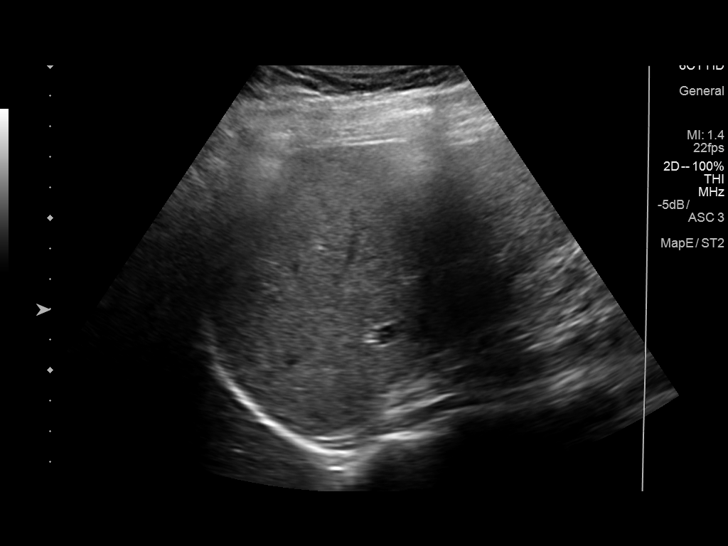
[im 10/40]
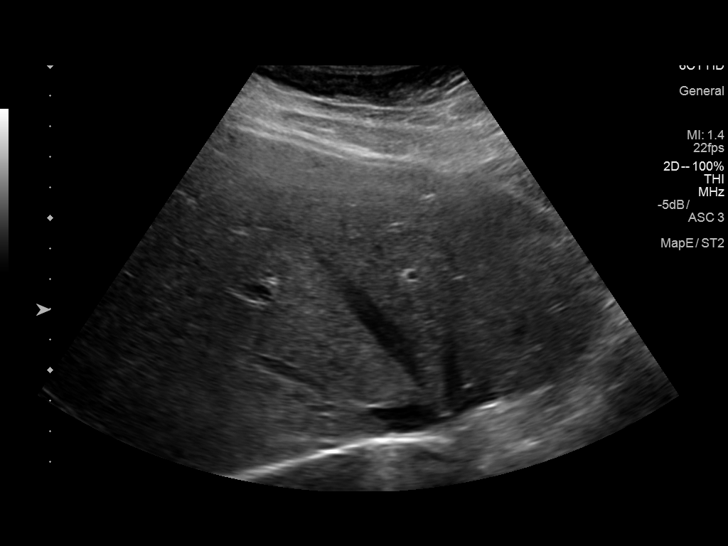
[im 14/40]
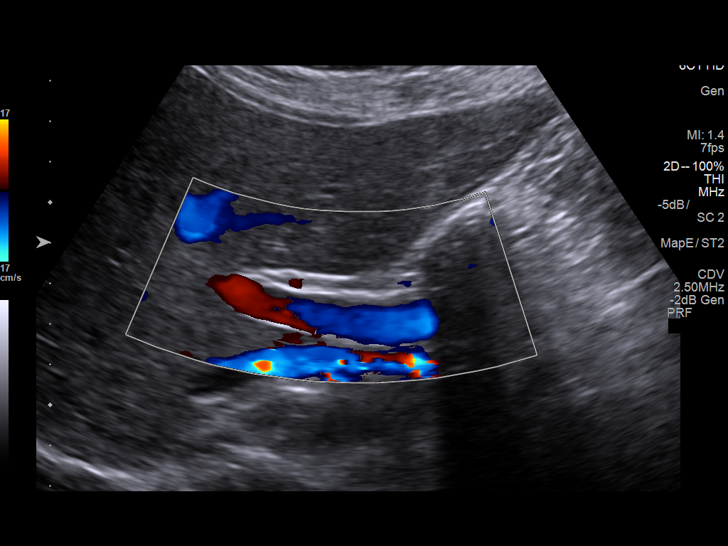
[im 15/40]
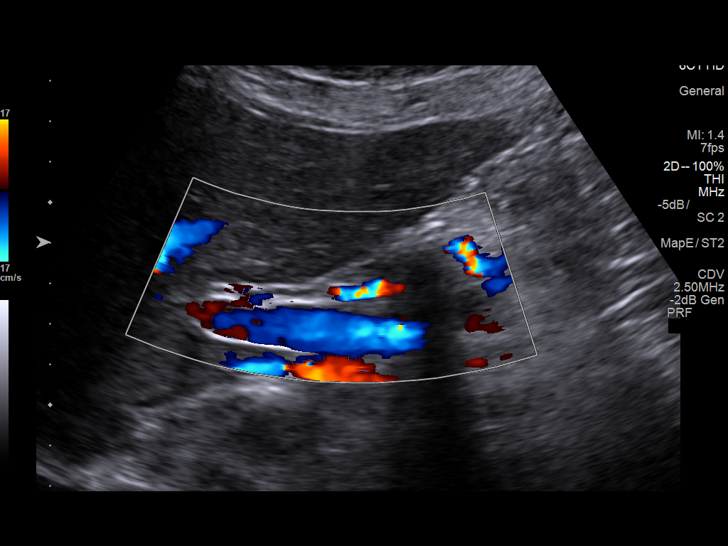
[im 18/40]
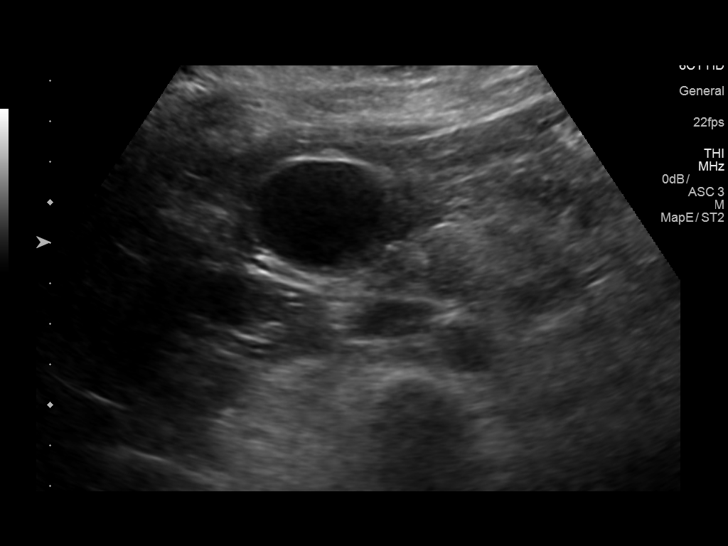
[im 22/40]
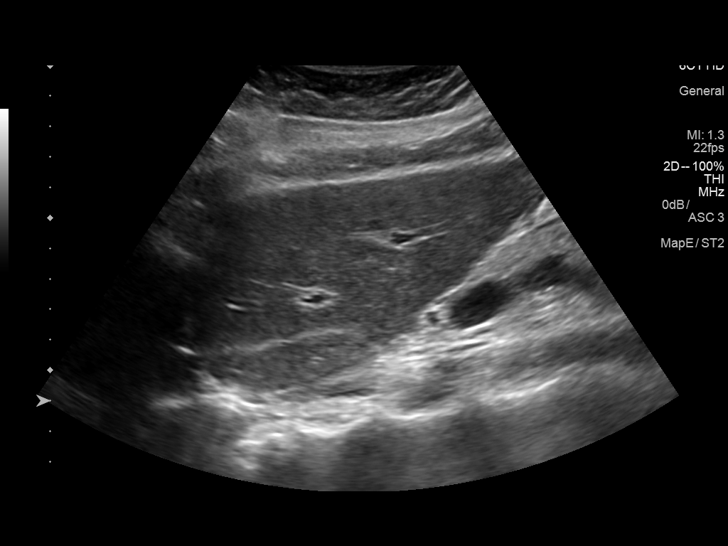
[im 25/40]
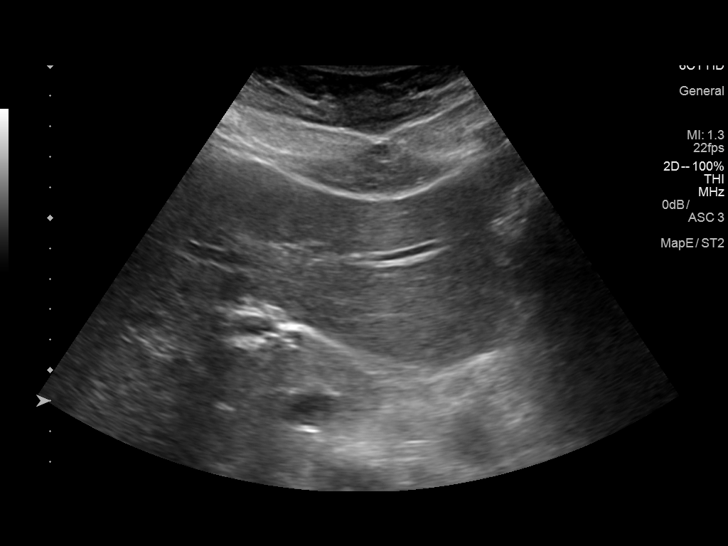
[im 27/40]
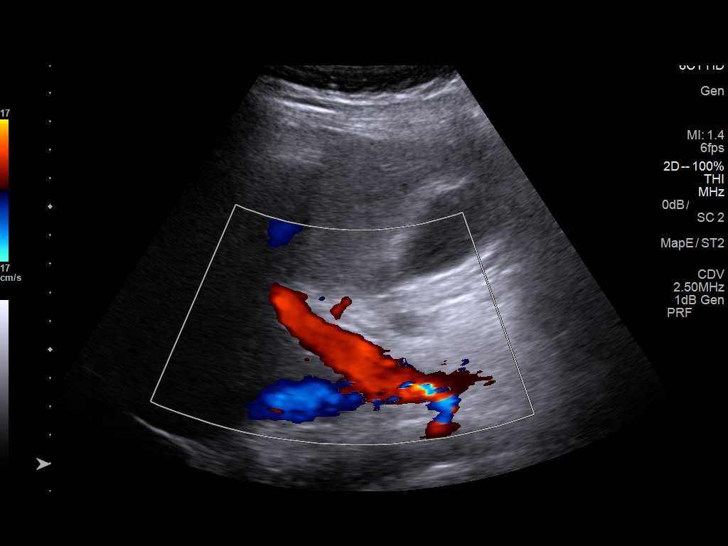
[im 30/40]
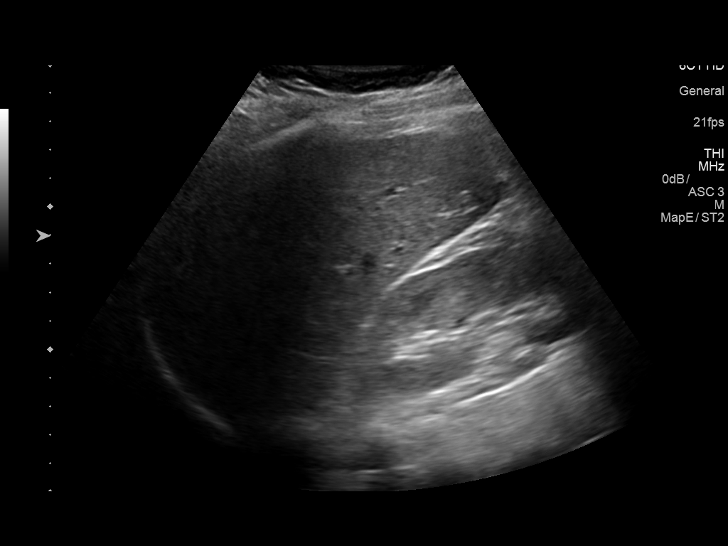
[im 33/40]
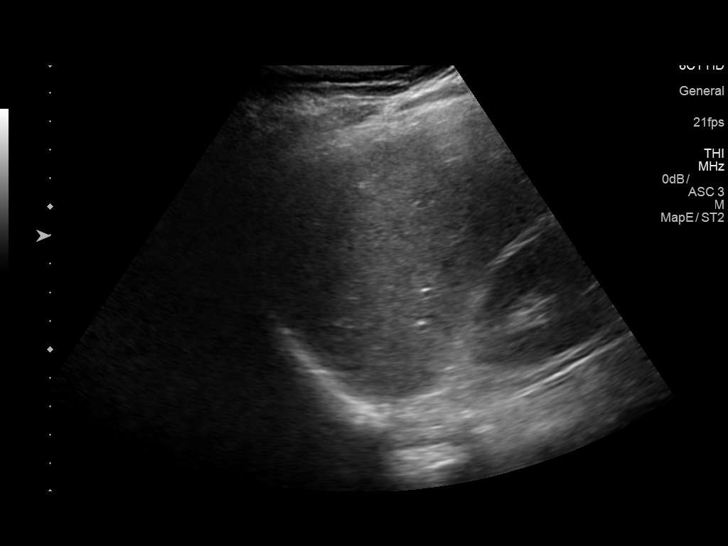
[im 36/40]
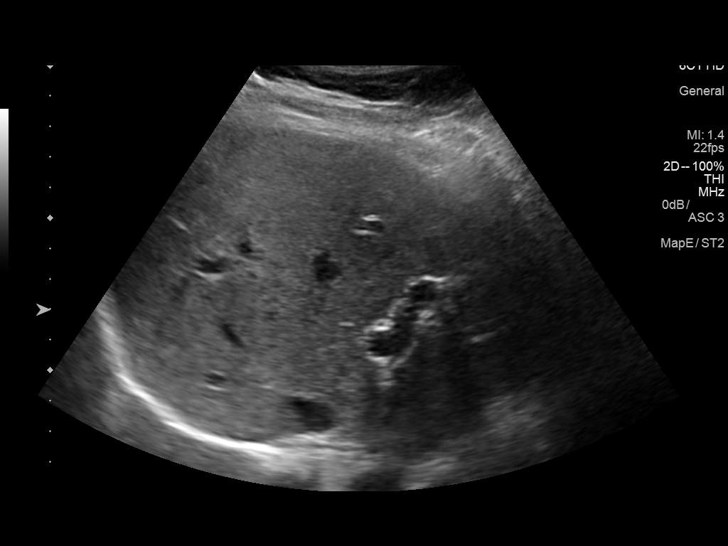
[im 40/40]
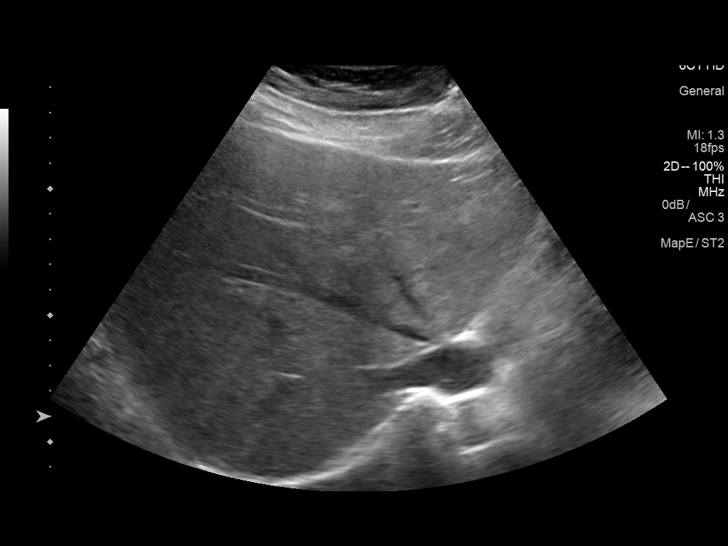

[14 of 25 positions shown; findings below may reference images not displayed]

FINDINGS: Gallbladder:

No gallstones or wall thickening visualized. There is no
pericholecystic fluid. No sonographic Murphy sign noted by
sonographer.

Common bile duct:

Diameter: 1 mm. No intrahepatic or extrahepatic biliary duct
dilatation.

Liver:

No focal lesion identified. Within normal limits in parenchymal
echogenicity. Portal vein is patent on color Doppler imaging with
normal direction of blood flow towards the liver.
IMPRESSION: Study within normal limits.

## 2020-05-12 ENCOUNTER — Emergency Department (HOSPITAL_COMMUNITY)
Admission: EM | Admit: 2020-05-12 | Discharge: 2020-05-12 | Disposition: A | Discharge status: Home / Self Care | Payer: Medicaid Other | Attending: Emergency Medicine | Admitting: Emergency Medicine

## 2020-05-12 ENCOUNTER — Other Ambulatory Visit: Payer: Self-pay

## 2020-05-12 ENCOUNTER — Encounter (HOSPITAL_COMMUNITY): Payer: Self-pay

## 2020-05-12 DIAGNOSIS — F17210 Nicotine dependence, cigarettes, uncomplicated: Secondary | ICD-10-CM | POA: Insufficient documentation

## 2020-05-12 DIAGNOSIS — L03011 Cellulitis of right finger: Secondary | ICD-10-CM | POA: Insufficient documentation

## 2020-05-12 MED ORDER — NAPROXEN 250 MG PO TABS
500.0000 mg | ORAL_TABLET | Freq: Once | ORAL | Status: AC
  Administered 2020-05-12: 500 mg via ORAL
  Filled 2020-05-12: qty 2

## 2020-05-12 MED ORDER — DOXYCYCLINE HYCLATE 100 MG PO CAPS: 100.0000 mg | ORAL_CAPSULE | Freq: Two times a day (BID) | ORAL | 0 refills | Status: DC

## 2020-05-12 MED ORDER — ONDANSETRON HCL 4 MG/2ML IJ SOLN: 4.0000 mg | Freq: Once | INTRAMUSCULAR | Status: DC

## 2020-05-12 MED ORDER — BUPRENORPHINE HCL-NALOXONE HCL 8-2 MG SL SUBL
1.0000 | SUBLINGUAL_TABLET | Freq: Every day | SUBLINGUAL | Status: DC
  Administered 2020-05-12: 1 via SUBLINGUAL
  Filled 2020-05-12: qty 1

## 2020-05-12 MED ORDER — ONDANSETRON 4 MG PO TBDP
4.0000 mg | ORAL_TABLET | Freq: Once | ORAL | Status: AC
  Administered 2020-05-12: 4 mg via ORAL
  Filled 2020-05-12: qty 1

## 2020-05-12 NOTE — ED Provider Notes (Signed)
[facility] EMERGENCY DEPARTMENT Provider Note   CSN: [id number] Arrival date & time: 05/12/20  0545     History Chief Complaint  Patient presents with  . finger infection    Angela Moran is a 30 y.o. female.  30 year old female who presents emergency department with chief complaint of right index finger infection.  5 days ago she had small infection by the nailbed and it has rapidly worsened.  Now the entire finger pad is swollen, throbbing, severely painful.  She denies any fevers or chills.  She is never had anything like this before.  Secondly the patient is on Suboxone therapy but has not been unable to attend her clinic for the past 2 days due to the snowstorm.  She has some mild withdrawal symptoms including some stomach cramping, loose stools and nausea.  She has not had any vomiting.  HPI     Past Medical History:  Diagnosis Date  . Depression    doing ok  . HSV-2 infection   . Infection    UTI  . Pregnant   . 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 Vaginal Pap smear, abnormal    clear on repeat    Patient Active Problem List   Diagnosis Date Noted  . Alcohol use 04/13/2018  . Status post cesarean section 03/07/2018  . 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) 02/23/2018  . 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 Smoker 12/18/2017  . History of illicit drug use 07/17/2017  . Depression with anxiety 12/04/2013    Past Surgical History:  Procedure Laterality Date  . CESAREAN SECTION N/A 03/07/2018   Procedure: REPEAT CESAREAN SECTION;  Surgeon: Levie Heritage, DO;  [facility]: [facility];  Service: Obstetrics;  Laterality: N/A;  . NO PAST SURGERIES       OB History    Gravida  1   Para  1   Term  1   Preterm  0   AB  0   Living  1     SAB  0   IAB  0   Ectopic  0   Multiple  0   Live Births  1           Family History  Problem Relation Age of Onset  . Other Mother        Bone  deteriation  . Alcohol abuse Mother   . Cirrhosis Father   . Alcohol abuse Father   . Diabetes Brother   . Alcohol abuse Maternal Grandmother   . Alcohol abuse Maternal Grandfather   . Alcohol abuse Paternal Grandmother   . Alcohol abuse Paternal Grandfather   . Autism Brother   . ADD / ADHD Brother     Social History   Tobacco Use  . Smoking status: Current Every Day Smoker    Packs/day: 0.50    Years: 6.00    Pack years: 3.00    Types: Cigarettes  . Smokeless tobacco: Never Used  Vaping Use  . Vaping Use: Former  Substance Use Topics  . Alcohol use: Yes    Comment: 4 beers daily, 2-4 shots daily  . Drug use: Not Currently    Types: Marijuana, Cocaine, Heroin    Comment: last used last week    Home Medications Prior to Admission medications   Medication Sig Start Date End Date Taking? Authorizing Provider  doxycycline (VIBRAMYCIN) 100 MG capsule Take 1 capsule (100 mg total) by mouth  2 (two) times daily. 05/12/20  Yes Prajwal Fellner, PA-C  benzonatate (TESSALON) 100 MG capsule Take 1 capsule (100 mg total) by mouth every 8 (eight) hours. Patient not taking: Reported on 02/01/2019 12/26/18   Hall-Potvin, [place], PA-C  ibuprofen (ADVIL) 800 MG tablet Take 1 tablet (800 mg total) by mouth every 8 (eight) hours as needed. 02/09/19   Lawyer, Cristal Deer, PA-C  lidocaine (XYLOCAINE) 2 % solution Use as directed 15 mLs in the mouth or throat as needed for mouth pain. Patient not taking: Reported on 02/01/2019 12/26/18   Hall-Potvin, [place], PA-C  promethazine (PHENERGAN) 25 MG tablet Take 1 tablet (25 mg total) by mouth every 6 (six) hours as needed for nausea or vomiting. 02/09/19   Lawyer, Cristal Deer, PA-C  valACYclovir (VALTREX) 1000 MG tablet Take 1 tablet (1,000 mg total) by mouth 2 (two) times daily. Patient not taking: Reported on 09/18/2018 02/25/18   Hermina Staggers, MD    Allergies    Patient has no known allergies.  Review of Systems   Review of Systems Ten  systems reviewed and are negative for acute change, except as noted in the HPI.   Physical Exam Updated Vital Signs BP 117/79 (BP Location: Right Arm)   Pulse 72   Temp 98.3 F (36.8 C) (Oral)   Resp 16   SpO2 100%   Physical Exam Vitals and nursing note reviewed.  Constitutional:      General: She is not in acute distress.    Appearance: She is well-developed and well-nourished. She is not diaphoretic.  HENT:     Head: Normocephalic and atraumatic.  Eyes:     General: No scleral icterus.    Conjunctiva/sclera: Conjunctivae normal.  Cardiovascular:     Rate and Rhythm: Normal rate and regular rhythm.     Heart sounds: Normal heart sounds. No murmur heard. No friction rub. No gallop.   Pulmonary:     Effort: Pulmonary effort is normal. No respiratory distress.     Breath sounds: Normal breath sounds.  Abdominal:     General: Bowel sounds are normal. There is no distension.     Palpations: Abdomen is soft. There is no mass.     Tenderness: There is no abdominal tenderness. There is no guarding.  Musculoskeletal:     Cervical back: Normal range of motion.     Comments: Right index finger with Marked swelling of the disatl pad with Obvious purulence under the skin.  No tenderness along the flexor sheath.  No sausage digit.  Able to move all joints.  No lymphangitis  Skin:    General: Skin is warm and dry.  Neurological:     Mental Status: She is alert and oriented to person, place, and time.  Psychiatric:        Behavior: Behavior normal.     ED Results / Procedures / Treatments   Labs (all labs ordered are listed, but only abnormal results are displayed) Labs Reviewed - No data to display  EKG None  Radiology No results found.  Procedures .Marland KitchenIncision and Drainage  Date/Time: 05/12/2020 1:04 PM Performed by: Arthor Captain, PA-C Authorized by: Arthor Captain, PA-C   Consent:    Consent obtained:  Verbal   Consent given by:  Patient   Risks, benefits, and  alternatives were discussed: yes     Risks discussed:  Bleeding, incomplete drainage and pain Universal protocol:    Patient identity confirmed:  Verbally with patient Location:    Indications for incision and  drainage: Fingertip felon.   Size:  2 cm   Location:  Upper extremity   Upper extremity location:  Finger   Finger location:  R index finger Pre-procedure details:    Skin preparation:  Povidone-iodine Anesthesia:    Anesthesia method:  Nerve block   Block needle gauge:  25 G   Block anesthetic:  Bupivacaine 0.5% w/o epi   Block technique:  MCP   Block injection procedure:  Anatomic landmarks identified   Block outcome:  Anesthesia achieved Procedure type:    Complexity:  Complex Procedure details:    Incision types:  Elliptical   Incision depth:  Subcutaneous   Wound management:  Probed and deloculated, irrigated with saline and debrided   Drainage:  Purulent   Drainage amount:  Moderate   Packing materials:  1/4 in iodoform gauze   (including critical care time)  Medications Ordered in ED Medications  buprenorphine-naloxone (SUBOXONE) 8-2 mg per SL tablet 1 tablet (1 tablet Sublingual Given 05/12/20 1039)  naproxen (NAPROSYN) tablet 500 mg (500 mg Oral Given 05/12/20 1039)  ondansetron (ZOFRAN-ODT) disintegrating tablet 4 mg (4 mg Oral Given 05/12/20 1042)    ED Course  I have reviewed the triage vital signs and the nursing notes.  Pertinent labs & imaging results that were available during my care of the patient were reviewed by me and considered in my medical decision making (see chart for details).    MDM Rules/Calculators/A&P                          Patient with felon of the right index finger.  She tolerated incision and drainage successfully.  Packing was placed. Patient is to return to the emergency department in 48 hours for packing removal and wound reassessment.  I started her on doxycycline.  Patient was given her a dose of her Suboxone today and is  advised to follow-up with her clinic for further medication needs. Final Clinical Impression(s) / ED Diagnoses Final diagnoses:  Felon of finger of right hand    Rx / DC Orders ED Discharge Orders         Ordered    doxycycline (VIBRAMYCIN) 100 MG capsule  2 times daily        05/12/20 1117           Arthor Captain, PA-C 05/12/20 1432    Terald Sleeper, MD 05/12/20 [phone]

## 2020-05-12 NOTE — ED Triage Notes (Signed)
Pt reports that for the past 6 days she has has swelling to her r index finger around the nailbed, pt attempted to drain it some, and then swelling became worse. Pt also states that she has been out of her suboxone for 2 days

## 2020-05-14 ENCOUNTER — Emergency Department (HOSPITAL_COMMUNITY)
Admission: EM | Admit: 2020-05-14 | Discharge: 2020-05-14 | Disposition: A | Discharge status: Home / Self Care | Payer: Medicaid Other | Attending: Emergency Medicine | Admitting: Emergency Medicine

## 2020-05-14 ENCOUNTER — Other Ambulatory Visit: Payer: Self-pay

## 2020-05-14 ENCOUNTER — Encounter (HOSPITAL_COMMUNITY): Payer: Self-pay

## 2020-05-14 DIAGNOSIS — Z4801 Encounter for change or removal of surgical wound dressing: Secondary | ICD-10-CM | POA: Insufficient documentation

## 2020-05-14 DIAGNOSIS — Z5321 Procedure and treatment not carried out due to patient leaving prior to being seen by health care provider: Secondary | ICD-10-CM | POA: Insufficient documentation

## 2020-05-14 NOTE — ED Triage Notes (Signed)
Pt states she was here to have the packing removed from her finger.. she was told to come back in 2 days for packing removal.

## 2020-05-17 ENCOUNTER — Other Ambulatory Visit: Payer: Self-pay

## 2020-05-17 ENCOUNTER — Encounter (HOSPITAL_COMMUNITY): Payer: Self-pay | Admitting: Physician Assistant

## 2020-05-17 ENCOUNTER — Emergency Department (HOSPITAL_COMMUNITY)
Admission: EM | Admit: 2020-05-17 | Discharge: 2020-05-17 | Disposition: A | Discharge status: Other Acute Inpatient Hospital | Payer: Medicaid Other | Attending: Emergency Medicine | Admitting: Emergency Medicine

## 2020-05-17 ENCOUNTER — Inpatient Hospital Stay (HOSPITAL_COMMUNITY)
Admission: AD | Admit: 2020-05-17 | Discharge: 2020-05-21 | DRG: 885 | Disposition: A | Discharge status: Home / Self Care | Payer: Federal, State, Local not specified - Other | Source: Intra-hospital | Attending: Psychiatry | Admitting: Psychiatry

## 2020-05-17 ENCOUNTER — Ambulatory Visit (HOSPITAL_COMMUNITY)
Admission: RE | Admit: 2020-05-17 | Discharge: 2020-05-17 | Disposition: A | Discharge status: Home / Self Care | Payer: Federal, State, Local not specified - Other | Attending: Psychiatry | Admitting: Psychiatry

## 2020-05-17 DIAGNOSIS — Z79899 Other long term (current) drug therapy: Secondary | ICD-10-CM

## 2020-05-17 DIAGNOSIS — F1590 Other stimulant use, unspecified, uncomplicated: Secondary | ICD-10-CM | POA: Diagnosis present

## 2020-05-17 DIAGNOSIS — F1020 Alcohol dependence, uncomplicated: Secondary | ICD-10-CM | POA: Insufficient documentation

## 2020-05-17 DIAGNOSIS — U071 COVID-19: Secondary | ICD-10-CM | POA: Diagnosis present

## 2020-05-17 DIAGNOSIS — Y901 Blood alcohol level of 20-39 mg/100 ml: Secondary | ICD-10-CM | POA: Insufficient documentation

## 2020-05-17 DIAGNOSIS — L03019 Cellulitis of unspecified finger: Secondary | ICD-10-CM | POA: Diagnosis present

## 2020-05-17 DIAGNOSIS — Z811 Family history of alcohol abuse and dependence: Secondary | ICD-10-CM

## 2020-05-17 DIAGNOSIS — F1510 Other stimulant abuse, uncomplicated: Secondary | ICD-10-CM | POA: Diagnosis present

## 2020-05-17 DIAGNOSIS — F1520 Other stimulant dependence, uncomplicated: Secondary | ICD-10-CM | POA: Insufficient documentation

## 2020-05-17 DIAGNOSIS — F1120 Opioid dependence, uncomplicated: Secondary | ICD-10-CM | POA: Insufficient documentation

## 2020-05-17 DIAGNOSIS — R45851 Suicidal ideations: Secondary | ICD-10-CM | POA: Diagnosis present

## 2020-05-17 DIAGNOSIS — F332 Major depressive disorder, recurrent severe without psychotic features: Secondary | ICD-10-CM | POA: Diagnosis present

## 2020-05-17 DIAGNOSIS — Z818 Family history of other mental and behavioral disorders: Secondary | ICD-10-CM

## 2020-05-17 DIAGNOSIS — Z9152 Personal history of nonsuicidal self-harm: Secondary | ICD-10-CM

## 2020-05-17 DIAGNOSIS — F17210 Nicotine dependence, cigarettes, uncomplicated: Secondary | ICD-10-CM | POA: Insufficient documentation

## 2020-05-17 DIAGNOSIS — Z638 Other specified problems related to primary support group: Secondary | ICD-10-CM

## 2020-05-17 DIAGNOSIS — T43621A Poisoning by amphetamines, accidental (unintentional), initial encounter: Secondary | ICD-10-CM | POA: Insufficient documentation

## 2020-05-17 DIAGNOSIS — F1010 Alcohol abuse, uncomplicated: Secondary | ICD-10-CM | POA: Diagnosis present

## 2020-05-17 DIAGNOSIS — F1110 Opioid abuse, uncomplicated: Secondary | ICD-10-CM | POA: Diagnosis present

## 2020-05-17 DIAGNOSIS — Z560 Unemployment, unspecified: Secondary | ICD-10-CM | POA: Diagnosis not present

## 2020-05-17 DIAGNOSIS — Z5900 Homelessness unspecified: Secondary | ICD-10-CM

## 2020-05-17 DIAGNOSIS — F419 Anxiety disorder, unspecified: Secondary | ICD-10-CM | POA: Diagnosis present

## 2020-05-17 LAB — RAPID URINE DRUG SCREEN, HOSP PERFORMED
Amphetamines: NOT DETECTED
Barbiturates: NOT DETECTED
Benzodiazepines: NOT DETECTED
Cocaine: NOT DETECTED
Opiates: NOT DETECTED
Tetrahydrocannabinol: NOT DETECTED

## 2020-05-17 LAB — COMPREHENSIVE METABOLIC PANEL
ALT: 28 U/L (ref 0–44)
AST: 24 U/L (ref 15–41)
Albumin: 4 g/dL (ref 3.5–5.0)
Alkaline Phosphatase: 71 U/L (ref 38–126)
Anion gap: 11 (ref 5–15)
BUN: 7 mg/dL (ref 6–20)
CO2: 23 mmol/L (ref 22–32)
Calcium: 9.4 mg/dL (ref 8.9–10.3)
Chloride: 105 mmol/L (ref 98–111)
Creatinine, Ser: 0.74 mg/dL (ref 0.44–1.00)
GFR, Estimated: >60 mL/min (ref >60)
Glucose, Bld: 94 mg/dL (ref 70–99)
Potassium: 4.7 mmol/L (ref 3.5–5.1)
Sodium: 139 mmol/L (ref 135–145)
Total Bilirubin: 0.6 mg/dL (ref 0.3–1.2)
Total Protein: 7 g/dL (ref 6.5–8.1)

## 2020-05-17 LAB — CBC
HCT: 48.5 % — ABNORMAL HIGH (ref 36.0–46.0)
Hemoglobin: 15.5 g/dL — ABNORMAL HIGH (ref 12.0–15.0)
MCH: 29.5 pg (ref 26.0–34.0)
MCHC: 32 g/dL (ref 30.0–36.0)
MCV: 92.4 fL (ref 80.0–100.0)
Platelets: 347 10*3/uL (ref 150–400)
RBC: 5.25 MIL/uL — ABNORMAL HIGH (ref 3.87–5.11)
RDW: 12.5 % (ref 11.5–15.5)
WBC: 6.8 10*3/uL (ref 4.0–10.5)
nRBC: 0 % (ref 0.0–0.2)

## 2020-05-17 LAB — I-STAT BETA HCG BLOOD, ED (MC, WL, AP ONLY): I-stat hCG, quantitative: <5 m[IU]/mL (ref <5)

## 2020-05-17 LAB — RESP PANEL BY RT-PCR (FLU A&B, COVID) ARPGX2
Influenza A by PCR: NEGATIVE
Influenza B by PCR: NEGATIVE
SARS Coronavirus 2 by RT PCR: POSITIVE — AB

## 2020-05-17 LAB — ACETAMINOPHEN LEVEL: Acetaminophen (Tylenol), Serum: <10 ug/mL — ABNORMAL LOW (ref 10–30)

## 2020-05-17 LAB — ETHANOL: Alcohol, Ethyl (B): 29 mg/dL — ABNORMAL HIGH

## 2020-05-17 LAB — SALICYLATE LEVEL: Salicylate Lvl: <7 mg/dL — ABNORMAL LOW (ref 7.0–30.0)

## 2020-05-17 MED ORDER — ALUM & MAG HYDROXIDE-SIMETH 200-200-20 MG/5ML PO SUSP: 30.0000 mL | ORAL | Status: DC | PRN

## 2020-05-17 MED ORDER — TRAZODONE HCL 50 MG PO TABS
50.0000 mg | ORAL_TABLET | Freq: Every evening | ORAL | Status: DC | PRN
Start: 2020-05-17 — End: 2020-05-21
  Administered 2020-05-18 – 2020-05-20 (×3): 50 mg via ORAL
  Filled 2020-05-17: qty 7
  Filled 2020-05-17 (×3): qty 1

## 2020-05-17 MED ORDER — MAGNESIUM HYDROXIDE 400 MG/5ML PO SUSP: 30.0000 mL | Freq: Every day | ORAL | Status: DC | PRN

## 2020-05-17 MED ORDER — HYDROXYZINE HCL 25 MG PO TABS
25.0000 mg | ORAL_TABLET | Freq: Three times a day (TID) | ORAL | Status: DC | PRN
  Filled 2020-05-17: qty 1

## 2020-05-17 MED ORDER — LORAZEPAM 1 MG PO TABS
1.0000 mg | ORAL_TABLET | Freq: Once | ORAL | Status: AC
  Administered 2020-05-17: 1 mg via ORAL
  Filled 2020-05-17: qty 1

## 2020-05-17 MED ORDER — DOXYCYCLINE HYCLATE 100 MG PO TABS
100.0000 mg | ORAL_TABLET | Freq: Two times a day (BID) | ORAL | Status: DC
  Administered 2020-05-18 – 2020-05-21 (×7): 100 mg via ORAL
  Filled 2020-05-17 (×3): qty 1
  Filled 2020-05-17: qty 14
  Filled 2020-05-17 (×5): qty 1
  Filled 2020-05-17: qty 14
  Filled 2020-05-17: qty 1

## 2020-05-17 MED ORDER — DOXYCYCLINE HYCLATE 100 MG PO TABS
100.0000 mg | ORAL_TABLET | Freq: Two times a day (BID) | ORAL | Status: DC
  Administered 2020-05-17: 100 mg via ORAL
  Filled 2020-05-17: qty 1

## 2020-05-17 MED ORDER — ACETAMINOPHEN 325 MG PO TABS
650.0000 mg | ORAL_TABLET | Freq: Four times a day (QID) | ORAL | Status: DC | PRN
  Administered 2020-05-20: 650 mg via ORAL
  Filled 2020-05-17: qty 2

## 2020-05-17 NOTE — H&P (Signed)
Behavioral Health Medical Screening Exam  Angela Moran is an 30 y.o. female.  Total Time spent with patient: 45 minutes  Psychiatric Specialty Exam: Physical Exam Vitals and nursing note reviewed.  Constitutional:      Appearance: Normal appearance.  HENT:     Head: Normocephalic.     Nose: Nose normal.  Pulmonary:     Effort: Pulmonary effort is normal.  Musculoskeletal:        General: Normal range of motion.     Cervical back: Normal range of motion.  Neurological:     General: No focal deficit present.     Mental Status: She is alert and oriented to person, place, and time.    Review of Systems  Psychiatric/Behavioral: Positive for dysphoric mood and suicidal ideas. The patient is nervous/anxious.   All other systems reviewed and are negative.  Blood pressure 131/82, pulse 92, temperature 98.9 F (37.2 C), temperature source Oral, resp. rate 20, SpO2 100 %.There is no height or weight on file to calculate BMI. General Appearance: Casual Eye Contact:  Fair Speech:  Normal Rate Volume:  Normal Mood:  Anxious and Depressed Affect:  Congruent Thought Process:  Coherent and Descriptions of Associations: Intact Orientation:  Full (Time, Place, and Person) Thought Content:  WDL and Logical Suicidal Thoughts:  No Homicidal Thoughts:  No Memory:  Immediate;   Good Recent;   Good Remote;   Good Judgement:  Fair Insight:  Fair Psychomotor Activity:  Normal Concentration: Concentration: Fair and Attention Span: Fair Recall:  [employer] of Knowledge:Good Language: Good Akathisia:  No Handed:  Right AIMS (if indicated):    Assets:  Leisure Time Physical Health Resilience Social Support Sleep:     Musculoskeletal: Strength & Muscle Tone: within normal limits Gait & Station: normal Patient leans: N/A  Blood pressure 131/82, pulse 92, temperature 98.9 F (37.2 C), temperature source Oral, resp. rate 20, SpO2 100 %.  Recommendations: Based on my evaluation  the patient does not appear to have an emergency medical condition.  Sent to ED for labs based on her alcohol intake today and use of fentanyl yesterday, needs medical clearance prior to admission at [facility], NP 05/17/2020, 5:07 PM

## 2020-05-17 NOTE — ED Provider Notes (Addendum)
[facility]-EMERGENCY DEPT Provider Note   CSN: [id number] Arrival date & time: 05/17/20  1657     History Chief Complaint  Patient presents with  . Suicidal    Angela Moran is a 30 y.o. female.  30 year old female presents with suicidal ideations with plan to overdose. Patient admits to using methamphetamines along with drinking alcohol. No prior history of suicide attempt in the past. States has been noncompliant with her psychiatric medications. Denies any auditory or visual hallucinations. Denies responding to internal stimuli.        Past Medical History:  Diagnosis Date  . Depression    doing ok  . HSV-2 infection   . Infection    UTI  . Pregnant   . 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 Vaginal Pap smear, abnormal    clear on repeat    Patient Active Problem List   Diagnosis Date Noted  . Alcohol use 04/13/2018  . Status post cesarean section 03/07/2018  . 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) 02/23/2018  . 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 Smoker 12/18/2017  . History of illicit drug use 07/17/2017  . Depression with anxiety 12/04/2013    Past Surgical History:  Procedure Laterality Date  . CESAREAN SECTION N/A 03/07/2018   Procedure: REPEAT CESAREAN SECTION;  Surgeon: Levie Heritage, DO;  Location: [facility];  Service: Obstetrics;  Laterality: N/A;  . NO PAST SURGERIES       OB History    Gravida  1   Para  1   Term  1   Preterm  0   AB  0   Living  1     SAB  0   IAB  0   Ectopic  0   Multiple  0   Live Births  1           Family History  Problem Relation Age of Onset  . Other Mother        Bone deteriation  . Alcohol abuse Mother   . Cirrhosis Father   . Alcohol abuse Father   . Diabetes Brother   . Alcohol abuse Maternal Grandmother   . Alcohol abuse Maternal Grandfather   . Alcohol abuse Paternal Grandmother   . Alcohol abuse Paternal Grandfather    . Autism Brother   . ADD / ADHD Brother     Social History   Tobacco Use  . Smoking status: Current Every Day Smoker    Packs/day: 0.50    Years: 6.00    Pack years: 3.00    Types: Cigarettes  . Smokeless tobacco: Never Used  Vaping Use  . Vaping Use: Former  Substance Use Topics  . Alcohol use: Yes    Comment: 4 beers daily, 2-4 shots daily  . Drug use: Not Currently    Types: Marijuana, Cocaine, Heroin    Comment: last used last week    Home Medications Prior to Admission medications   Medication Sig Start Date End Date Taking? Authorizing Provider  benzonatate (TESSALON) 100 MG capsule Take 1 capsule (100 mg total) by mouth every 8 (eight) hours. Patient not taking: Reported on 02/01/2019 12/26/18   Hall-Potvin, [place], PA-C  doxycycline (VIBRAMYCIN) 100 MG capsule Take 1 capsule (100 mg total) by mouth 2 (two) times daily. 05/12/20   Arthor Captain, PA-C  ibuprofen (ADVIL) 800 MG tablet Take 1 tablet (800 mg total) by mouth every 8 (eight) hours as  needed. 02/09/19   Lawyer, Cristal Deer, PA-C  lidocaine (XYLOCAINE) 2 % solution Use as directed 15 mLs in the mouth or throat as needed for mouth pain. Patient not taking: Reported on 02/01/2019 12/26/18   Hall-Potvin, [place], PA-C  promethazine (PHENERGAN) 25 MG tablet Take 1 tablet (25 mg total) by mouth every 6 (six) hours as needed for nausea or vomiting. 02/09/19   Lawyer, Cristal Deer, PA-C  valACYclovir (VALTREX) 1000 MG tablet Take 1 tablet (1,000 mg total) by mouth 2 (two) times daily. Patient not taking: Reported on 09/18/2018 02/25/18   Hermina Staggers, MD    Allergies    Patient has no known allergies.  Review of Systems   Review of Systems  All other systems reviewed and are negative.   Physical Exam Updated Vital Signs BP 119/82 (BP Location: Left Arm)   Pulse 96   Temp 97.7 F (36.5 C) (Oral)   Resp 18   SpO2 100%   Physical Exam Vitals and nursing note reviewed.  Constitutional:      General: She  is not in acute distress.    Appearance: Normal appearance. She is well-developed and well-nourished. She is not toxic-appearing.  HENT:     Head: Normocephalic and atraumatic.  Eyes:     General: Lids are normal.     Extraocular Movements: EOM normal.     Conjunctiva/sclera: Conjunctivae normal.     Pupils: Pupils are equal, round, and reactive to light.  Neck:     Thyroid: No thyroid mass.     Trachea: No tracheal deviation.  Cardiovascular:     Rate and Rhythm: Normal rate and regular rhythm.     Heart sounds: Normal heart sounds. No murmur heard. No gallop.   Pulmonary:     Effort: Pulmonary effort is normal. No respiratory distress.     Breath sounds: Normal breath sounds. No stridor. No decreased breath sounds, wheezing, rhonchi or rales.  Abdominal:     General: Bowel sounds are normal. There is no distension.     Palpations: Abdomen is soft.     Tenderness: There is no abdominal tenderness. There is no CVA tenderness or rebound.  Musculoskeletal:        General: No tenderness or edema. Normal range of motion.     Cervical back: Normal range of motion and neck supple.     Comments: Right index finger felon is healing at this time.  No evidence of flexor erythema.  Skin:    General: Skin is warm and dry.     Findings: No abrasion or rash.  Neurological:     Mental Status: She is alert and oriented to person, place, and time.     GCS: GCS eye subscore is 4. GCS verbal subscore is 5. GCS motor subscore is 6.     Cranial Nerves: No cranial nerve deficit.     Sensory: No sensory deficit.     Deep Tendon Reflexes: Strength normal.  Psychiatric:        Attention and Perception: Attention normal.        Mood and Affect: Affect is blunt.        Speech: Speech normal.        Behavior: Behavior normal.        Thought Content: Thought content includes suicidal ideation. Thought content includes suicidal plan.     ED Results / Procedures / Treatments   Labs (all labs ordered  are listed, but only abnormal results are displayed) Labs Reviewed  COMPREHENSIVE METABOLIC PANEL  ETHANOL  SALICYLATE LEVEL  ACETAMINOPHEN LEVEL  CBC  RAPID URINE DRUG SCREEN, HOSP PERFORMED  I-STAT BETA HCG BLOOD, ED ([facility], [facility], [facility] ONLY)    EKG None  Radiology No results found.  Procedures Procedures (including critical care time)  Medications Ordered in ED Medications - No data to display  ED Course  I have reviewed the triage vital signs and the nursing notes.  Pertinent labs & imaging results that were available during my care of the patient were reviewed by me and considered in my medical decision making (see chart for details).    MDM Rules/Calculators/A&P                          Patient stable vital signs here. She is medically clear for psychiatric referral  9:21 PM Patient tested positive for COVID here.  Continued on doxycycline for her known felon   final Clinical Impression(s) / ED Diagnoses Final diagnoses:  None    Rx / DC Orders ED Discharge Orders    None       Lorre Nick, MD 05/17/20 1725    Lorre Nick, MD 05/17/20 2122

## 2020-05-17 NOTE — ED Notes (Signed)
[facility] contacted for transport

## 2020-05-17 NOTE — ED Notes (Signed)
Pt ambulated to bathroom with a steady gait
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
## 2020-05-17 NOTE — ED Triage Notes (Signed)
Patient reports to the ER for SI. Patient reports she drinks and smokes. Patient reports she also does any illicit substance she can get

## 2020-05-18 DIAGNOSIS — F1590 Other stimulant use, unspecified, uncomplicated: Secondary | ICD-10-CM | POA: Diagnosis present

## 2020-05-18 DIAGNOSIS — F1010 Alcohol abuse, uncomplicated: Secondary | ICD-10-CM

## 2020-05-18 DIAGNOSIS — U071 COVID-19: Secondary | ICD-10-CM | POA: Diagnosis present

## 2020-05-18 DIAGNOSIS — L03019 Cellulitis of unspecified finger: Secondary | ICD-10-CM | POA: Diagnosis present

## 2020-05-18 DIAGNOSIS — F1110 Opioid abuse, uncomplicated: Secondary | ICD-10-CM | POA: Diagnosis present

## 2020-05-18 LAB — LIPID PANEL
Cholesterol: 100 mg/dL (ref 0–200)
HDL: 42 mg/dL (ref >40)
LDL Cholesterol: 51 mg/dL (ref 0–99)
Total CHOL/HDL Ratio: 2.4 RATIO
Triglycerides: 36 mg/dL (ref <150)
VLDL: 7 mg/dL (ref 0–40)

## 2020-05-18 LAB — TSH: TSH: 0.274 u[IU]/mL — ABNORMAL LOW (ref 0.350–4.500)

## 2020-05-18 LAB — HEMOGLOBIN A1C
Hgb A1c MFr Bld: 5 % (ref 4.8–5.6)
Mean Plasma Glucose: 96.8 mg/dL

## 2020-05-18 MED ORDER — ADULT MULTIVITAMIN W/MINERALS CH
1.0000 | ORAL_TABLET | Freq: Every day | ORAL | Status: DC
  Administered 2020-05-18 – 2020-05-21 (×4): 1 via ORAL
  Filled 2020-05-18 (×6): qty 1

## 2020-05-18 MED ORDER — CLONIDINE HCL 0.1 MG PO TABS: 0.1000 mg | ORAL_TABLET | Freq: Three times a day (TID) | ORAL | Status: DC | PRN

## 2020-05-18 MED ORDER — ONDANSETRON 4 MG PO TBDP: 4.0000 mg | ORAL_TABLET | Freq: Four times a day (QID) | ORAL | Status: DC | PRN

## 2020-05-18 MED ORDER — LOPERAMIDE HCL 2 MG PO CAPS
2.0000 mg | ORAL_CAPSULE | ORAL | Status: DC | PRN
  Administered 2020-05-18: 2 mg via ORAL
  Filled 2020-05-18: qty 1

## 2020-05-18 MED ORDER — BACITRACIN ZINC 500 UNIT/GM EX OINT
TOPICAL_OINTMENT | Freq: Every day | CUTANEOUS | Status: DC
  Administered 2020-05-19: 31.5556 via TOPICAL
  Filled 2020-05-18 (×2): qty 28.35

## 2020-05-18 MED ORDER — LORAZEPAM 1 MG PO TABS
1.0000 mg | ORAL_TABLET | Freq: Four times a day (QID) | ORAL | Status: DC | PRN
  Administered 2020-05-18 – 2020-05-19 (×2): 1 mg via ORAL
  Filled 2020-05-18 (×2): qty 1

## 2020-05-18 MED ORDER — METHOCARBAMOL 500 MG PO TABS
500.0000 mg | ORAL_TABLET | Freq: Three times a day (TID) | ORAL | Status: DC | PRN
  Administered 2020-05-20 – 2020-05-21 (×3): 500 mg via ORAL
  Filled 2020-05-18 (×3): qty 1

## 2020-05-18 MED ORDER — NICOTINE 21 MG/24HR TD PT24
21.0000 mg | MEDICATED_PATCH | Freq: Every day | TRANSDERMAL | Status: DC
  Administered 2020-05-19 – 2020-05-21 (×3): 21 mg via TRANSDERMAL
  Filled 2020-05-18 (×6): qty 1

## 2020-05-18 MED ORDER — HYDROXYZINE HCL 25 MG PO TABS
25.0000 mg | ORAL_TABLET | Freq: Four times a day (QID) | ORAL | Status: DC | PRN
  Administered 2020-05-18 – 2020-05-21 (×8): 25 mg via ORAL
  Filled 2020-05-18: qty 10
  Filled 2020-05-18 (×8): qty 1

## 2020-05-18 MED ORDER — BUPROPION HCL ER (XL) 150 MG PO TB24
150.0000 mg | ORAL_TABLET | Freq: Every day | ORAL | Status: DC
Start: 2020-05-18 — End: 2020-05-21
  Administered 2020-05-18 – 2020-05-21 (×4): 150 mg via ORAL
  Filled 2020-05-18 (×4): qty 1
  Filled 2020-05-18: qty 7
  Filled 2020-05-18: qty 1

## 2020-05-18 MED ORDER — DICYCLOMINE HCL 20 MG PO TABS: 20.0000 mg | ORAL_TABLET | Freq: Four times a day (QID) | ORAL | Status: DC | PRN

## 2020-05-18 MED ORDER — NAPROXEN 500 MG PO TABS
500.0000 mg | ORAL_TABLET | Freq: Two times a day (BID) | ORAL | Status: DC | PRN
  Administered 2020-05-21: 500 mg via ORAL
  Filled 2020-05-18: qty 1

## 2020-05-18 MED ORDER — THIAMINE HCL 100 MG PO TABS
100.0000 mg | ORAL_TABLET | Freq: Every day | ORAL | Status: DC
  Administered 2020-05-19 – 2020-05-21 (×3): 100 mg via ORAL
  Filled 2020-05-18 (×5): qty 1

## 2020-05-18 NOTE — [facility] Group Notes (Signed)
Type of Therapy:  Group Therapy  Modes of Intervention:  Stress Management  Due to the COVID-19 pandemic, this group has been supplemented with worksheets.  Summary of Progress/Problems: CSW provided worksheet to patient due to COVID. CSW offered to meet individually with patient as needed  Drake Wuertz, LCSWA Clinicial Social Worker  Health 

## 2020-05-18 NOTE — Progress Notes (Signed)
Wound care provided.

## 2020-05-18 NOTE — Progress Notes (Signed)
Adult Psychoeducational Group Note  Date:  05/18/2020 Time:  9:44 PM  Group Topic/Focus:  Wrap-Up Group:   The focus of this group is to help patients review their daily goal of treatment and discuss progress on daily workbooks.  Participation Level:  Minimal  Participation Quality:  Appropriate  Affect:  Anxious, Defensive, Depressed and Irritable  Cognitive:  Disorganized, Confused and Lacking  Insight: Lacking and Limited  Engagement in Group:  Lacking and Limited  Modes of Intervention:  Discussion  Additional Comments:  Pt stated her goal for today was to focus on her treatment plan. Pt stated she felt she accomplished her goal today. Pt stated she did not get a chance to talk with her doctor or social worker, regarding her care today. Pt stated been able to contact her sister today, improved her overall day. Pt stated she took all her medication today from her providers. Pt rated her overall day a 2 out of 10. Pt stated all she did was sleep all day. Pt stated she is very depressed. Pt stated her appetite was poor today and she received but did not eat her meals today. Pt stated her sleep last night was good. Pt stated the goal for tonight was to get some rest. Pt stated she was in no physical pain. Pt deny auditory or visual hallucinations. Pt denies thoughts of harming others. Pt admitted to having thoughts of harming herself. Pt stated she was having those thoughts right now. Pt stated she could contract for safety. Pt nurse was made aware of situation. Pt stated she would alert staff if anything changes.  Felipa Furnace 05/18/2020, 9:44 PM

## 2020-05-18 NOTE — Progress Notes (Signed)
Progress note    05/18/20 0802  Psych Admission Type (Psych Patients Only)  Admission Status Voluntary  Psychosocial Assessment  Patient Complaints Agitation;Anxiety;Depression;Irritability;Worrying  Eye Contact Avertive;Brief  Facial Expression Anxious;Pensive;Sullen;Sad;Worried  Affect Anxious;Depressed;Sad;Sullen  Speech Logical/coherent  Interaction Cautious;Forwards little;Guarded;Minimal  Motor Activity Slow  Appearance/Hygiene Disheveled  Behavior Characteristics Cooperative;Appropriate to situation;Anxious;Guarded;Irritable  Mood Depressed;Anxious;Despair;Sad;Sullen;Pleasant  Thought Process  Coherency Concrete thinking  Content WDL  Delusions None reported or observed  Perception WDL  Hallucination None reported or observed  Judgment Poor  Confusion None  Danger to Self  Current suicidal ideation? Denies  Danger to Others  Danger to Others None reported or observed

## 2020-05-18 NOTE — Progress Notes (Signed)
Admission Note:  D: Pt was alert and oriented x 4.  Pt  denies SI /HI/ AVH at this time. Pt is goal oriented and want to get help with substance abuse and medication compliance. Pt is redirectable and cooperative with assessment.      A: Pt admitted to unit per protocol, skin assessment and search done and no contraband found.  Pt  educated on therapeutic milieu rules. Pt was introduced to milieu by nursing staff. All consents obtained.    R: Pt was receptive to education about the milieu .  15 min safety checks started. Clinical research associate offered support and maintained safety.

## 2020-05-18 NOTE — [facility] Suicide Risk Assessment (Addendum)
[facility] Admission Suicide Risk Assessment   Nursing information obtained from:  Patient Demographic factors:  Caucasian Current Mental Status:  Suicidal ideation indicated by patient Loss Factors:  Financial problems / change in socioeconomic status Historical Factors: previous mental health treatment and diagnosis; substance abuse Risk Reduction Factors:  Responsible for children under 31 years of age  Total Time Spent in Direct Patient Care:  I personally spent 45 minutes on the unit in direct patient care. The direct patient care time included face-to-face time with the patient, reviewing the patient's chart, communicating with other professionals, and coordinating care. Greater than 50% of this time was spent in counseling or coordinating care with the patient regarding goals of hospitalization, psycho-education, and discharge planning needs.  Principal Problem: <principal problem not specified> Diagnosis:  Active Problems:   MDD (major depressive disorder), recurrent episode, severe (HCC)  Subjective Data: Patient is a 29y/o female with a self reported h/o anxiety and depression who was admitted for suicidal ideation in the context of relapse with substances including alcohol, methamphetamine, and opiates. She states she was previously prescribed Suboxone while at [facility] and finished their rehab program, but she ran out of the medication 4 weeks ago and relapsed with Fentanyl, "ice," and alcohol recently. She admits to recent IVD use but does not quantify pattern or quantity of recent drug use. She states that she "left home" to live with her boyfriend in a tent for 3 weeks and has just recently opted to "move home" with her boyfriend's sister. She states her boyfriend is still homeless and cannot return to the home "because he is a bad person." She reports onset of suicidal thinking 5 days ago with plan to overdose on substances. She recalls being on Wellbutrin XL 300mg  daily while at [facility] but  cannot recall how long she has been off this antidepressant. She denies AVH, delusions, or paranoia. She denied h/o manic/hypomanic behaviors in the past. She denied any active COVID symptoms other than feeling "hot" with questionable fevers and report of "burning" sensation when she breaths. She denied CP or SOB. She reports a vague injury to her right index finger which has resulted in infection but cannot give details as to how she injured her finger. She denies injecting drugs into this finger. She would not cooperate for additional questions.  Per [facility] note prior to admission:  "She describes her mood as anxious and scared. Pt acknowledges symptoms including crying spells, social withdrawal, loss of interest in usual pleasures, fatigue, irritability, decreased concentration, decreased sleep, and feelings of guilt, worthlessness and hopelessness. She says she has been staying in bed. She reports a history of superficial cutting behavior but denies recent self-injury. She denies history of previous suicide attempts. She denies current homicidal ideation or history of violence. She denies history of auditory or visual hallucinations.   Pt identifies several stressors. She says CPS has placed her 15 year old son in her mother's custody and Pt has limited visitation. She says she is currently unemployed and under financial stress. She says she does have a place to live. She states her boyfriend is homeless and "a troublemaker." Pt reports she has a court date 06/18/2020 for stealing. She describes her relationship with her family as "horrible" and says she has very little support. She denies access to firearms. Pt says she has no mental health providers. She says she was psychiatrically hospitalized at a facility near [place], [place] approximately 5 months ago."  Continued Clinical Symptoms:  Alcohol Use Disorder  Identification Test Final Score (AUDIT): 26 The "Alcohol Use Disorders Identification Test",  Guidelines for Use in Primary Care, Second Edition.  World Science writer [facility]). Score between 0-7:  no or low risk or alcohol related problems. Score between 8-15:  moderate risk of alcohol related problems. Score between 16-19:  high risk of alcohol related problems. Score 20 or above:  warrants further diagnostic evaluation for alcohol dependence and treatment.  CLINICAL FACTORS:   Depression:   Anhedonia Hopelessness Impulsivity Insomnia Alcohol/Substance Abuse/Dependencies More than one psychiatric diagnosis Previous Psychiatric Diagnoses and Treatments  Musculoskeletal: Strength & Muscle Tone: within normal limits Gait & Station: untested Patient leans: N/A  Psychiatric Specialty Exam: Physical Exam Vitals reviewed.  Pulmonary:     Effort: Pulmonary effort is normal.  Neurological:     Mental Status: She is alert.     Review of Systems  HENT: Negative for congestion.   Respiratory: Negative for shortness of breath.   Cardiovascular: Negative for chest pain.  Gastrointestinal: Negative for diarrhea, nausea and vomiting.  Neurological: Negative for headaches.    Blood pressure 109/76, pulse 85, temperature 98.8 F (37.1 C), temperature source Oral, resp. rate 18, SpO2 99 %.There is no height or weight on file to calculate BMI.  General Appearance: disheveled appearing, in bed, minimally engaged with examiner  Eye Contact:  Minimal  Speech:  Clear and Coherent and Normal Rate  Volume:  Normal  Mood:  Irritable  Affect:  Constricted  Thought Process:  Ruminative but superficially goal directed  Orientation:  Would not cooperate for questioning  Thought Content:  Denies AVH, delusions, paranoia; no acute psychosis noted on exam; no obsessions/compulsions  Suicidal Thoughts:  Denies current SI, intent or plan but had SI with plan prior to admisson  Homicidal Thoughts:  Denied  Memory:  would not cooperate for questioning  Judgement:  Impaired  Insight:  Lacking   Psychomotor Activity:  fidgety in bed  Concentration:  Concentration: Poor and Attention Span: Poor  Recall:  Would not cooperate for formal testing  Fund of Knowledge:  Fair  Language:  Good  Akathisia:  Negative  Assets:  Desire for Improvement Resilience  ADL's:  Impaired in terms of hygiene  Cognition:  WNL   COGNITIVE FEATURES THAT CONTRIBUTE TO RISK:  None    SUICIDE RISK:   Moderate:  Frequent suicidal ideation with limited intensity, and duration, some specificity in terms of plans, no associated intent, good self-control, limited dysphoria/symptomatology, some risk factors present, and identifiable protective factors, including available and accessible social support.  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  Her Tylenol level was less than 10, salicylate level less than 7, alcohol level was 29, urine drug screen was negative, pregnancy test was negative, Covid test was positive, lipid panel was within normal limits, and TSH was low at 0.274.  An EKG is pending. We will order a free T3 and free T4 as well as a repeat TSH.  She was offered HIV and hepatitis testing in the context of her IV drug use and requested that we proceed with these labs with verbal consent obtained.  She has been placed on Covid quarantine and her vitals  including oxygen saturations will be monitored.  She will be continued on doxycycline 100 mg twice daily for management of her finger felon.  COWS will be monitored. PRN medications for possible opiate withdrawal will be ordered including Clonidine, Robaxin, Naproxen, Bentyl, Zofran, Imodium, and Vistaril. She will be placed on CIWA  monitoring for possible alcohol withdrawal with oral thiamine and MVI replacement. We will discuss antidepressant options with the patient as she will cooperate for discussion. See HP for additional treatment details.   I certify that inpatient services furnished can reasonably be expected to improve the patient's condition.   Comer Locket, MD, FAPA 05/18/2020, 2:19 PM

## 2020-05-18 NOTE — [facility] Counselor (Signed)
CSW attempted PSA with patient but patient did not wish to talk to the CSW and states that she is tired and does not wish to talk to anyone.  CSW will attempt PSA again tomorrow.  Patient did not want to get out of bed today and states that when she breathes her chest feels hot.
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
## 2020-05-18 NOTE — Tx Team (Addendum)
Interdisciplinary Treatment and Diagnostic Plan Update  05/18/2020 Time of Session: 9:20am  Angela Moran MRN: [id number]  Principal Diagnosis: <principal problem not specified>  Secondary Diagnoses: Active Problems:   MDD (major depressive disorder), recurrent episode, severe (HCC)   Current Medications:  Current Facility-Administered Medications  Medication Dose Route Frequency Provider Last Rate Last Admin  . acetaminophen (TYLENOL) tablet 650 mg  650 mg Oral Q6H PRN Nwoko, Uchenna E, PA      . alum & mag hydroxide-simeth (MAALOX/MYLANTA) 200-200-20 MG/5ML suspension 30 mL  30 mL Oral Q4H PRN Nwoko, Uchenna E, PA      . doxycycline (VIBRA-TABS) tablet 100 mg  100 mg Oral Q12H Nwoko, Uchenna E, PA   100 mg at 05/18/20 0802  . hydrOXYzine (ATARAX/VISTARIL) tablet 25 mg  25 mg Oral TID PRN Nwoko, Uchenna E, PA      . magnesium hydroxide (MILK OF MAGNESIA) suspension 30 mL  30 mL Oral Daily PRN Nwoko, Uchenna E, PA      . traZODone (DESYREL) tablet 50 mg  50 mg Oral QHS PRN Nwoko, Uchenna E, PA       PTA Medications: Medications Prior to Admission  Medication Sig Dispense Refill Last Dose  . benzonatate (TESSALON) 100 MG capsule Take 1 capsule (100 mg total) by mouth every 8 (eight) hours. (Patient not taking: No sig reported) 21 capsule 0 Unknown at Unknown time  . doxycycline (VIBRAMYCIN) 100 MG capsule Take 1 capsule (100 mg total) by mouth 2 (two) times daily. 14 capsule 0 Unknown at Unknown time  . ibuprofen (ADVIL) 800 MG tablet Take 1 tablet (800 mg total) by mouth every 8 (eight) hours as needed. 21 tablet 0 Unknown at Unknown time  . lidocaine (XYLOCAINE) 2 % solution Use as directed 15 mLs in the mouth or throat as needed for mouth pain. (Patient not taking: Reported on 02/01/2019) 100 mL 0   . promethazine (PHENERGAN) 25 MG tablet Take 1 tablet (25 mg total) by mouth every 6 (six) hours as needed for nausea or vomiting. 10 tablet 0   . valACYclovir (VALTREX) 1000 MG tablet  Take 1 tablet (1,000 mg total) by mouth 2 (two) times daily. (Patient not taking: Reported on 09/18/2018) 10 tablet 0     Patient Stressors: Medication change or noncompliance Substance abuse  Patient Strengths: Supportive family/friends  Treatment Modalities: Medication Management, Group therapy, Case management,  1 to 1 session with clinician, Psychoeducation, Recreational therapy.   Physician Treatment Plan for Primary Diagnosis: <principal problem not specified> Long Term Goal(s):     Short Term Goals:    Medication Management: Evaluate patient's response, side effects, and tolerance of medication regimen.  Therapeutic Interventions: 1 to 1 sessions, Unit Group sessions and Medication administration.  Evaluation of Outcomes: Progressing  Physician Treatment Plan for Secondary Diagnosis: Active Problems:   MDD (major depressive disorder), recurrent episode, severe (HCC)  Long Term Goal(s):     Short Term Goals:       Medication Management: Evaluate patient's response, side effects, and tolerance of medication regimen.  Therapeutic Interventions: 1 to 1 sessions, Unit Group sessions and Medication administration.  Evaluation of Outcomes: Progressing   RN Treatment Plan for Primary Diagnosis: <principal problem not specified> Long Term Goal(s): Knowledge of disease and therapeutic regimen to maintain health will improve  Short Term Goals: Ability to remain free from injury will improve, Ability to participate in decision making will improve, Ability to verbalize feelings will improve, Ability to disclose and discuss suicidal  ideas and Ability to identify and develop effective coping behaviors will improve  Medication Management: RN will administer medications as ordered by provider, will assess and evaluate patient's response and provide education to patient for prescribed medication. RN will report any adverse and/or side effects to prescribing provider.  Therapeutic  Interventions: 1 on 1 counseling sessions, Psychoeducation, Medication administration, Evaluate responses to treatment, Monitor vital signs and CBGs as ordered, Perform/monitor CIWA, COWS, AIMS and Fall Risk screenings as ordered, Perform wound care treatments as ordered.  Evaluation of Outcomes: Progressing   LCSW Treatment Plan for Primary Diagnosis: <principal problem not specified> Long Term Goal(s): Safe transition to appropriate next level of care at discharge, Engage patient in therapeutic group addressing interpersonal concerns.  Short Term Goals: Engage patient in aftercare planning with referrals and resources, Increase social support, Increase emotional regulation, Facilitate acceptance of mental health diagnosis and concerns, Identify triggers associated with mental health/substance abuse issues and Increase skills for wellness and recovery  Therapeutic Interventions: Assess for all discharge needs, 1 to 1 time with Social worker, Explore available resources and support systems, Assess for adequacy in community support network, Educate family and significant other(s) on suicide prevention, Complete Psychosocial Assessment, Interpersonal group therapy.  Evaluation of Outcomes: Progressing   Progress in Treatment: Attending groups: Yes. Participating in groups: Yes. Taking medication as prescribed: Yes. Toleration medication: Yes. Family/Significant other contact made: No, will contact:  If consents are given  Patient understands diagnosis: Yes. and No. Discussing patient identified problems/goals with staff: Yes. Medical problems stabilized or resolved: Yes. Denies suicidal/homicidal ideation: Yes. Issues/concerns per patient self-inventory: No.   New problem(s) identified: No, Describe:  None   New Short Term/Long Term Goal(s): medication stabilization, elimination of SI thoughts, development of comprehensive mental wellness plan.   Patient Goals:  "To get back on my  medications and to find a job"  Discharge Plan or Barriers: Patient recently admitted. CSW will continue to follow and assess for appropriate referrals and possible discharge planning.   Reason for Continuation of Hospitalization: Anxiety Depression Medication stabilization Suicidal ideation Withdrawal symptoms  Estimated Length of Stay: 3 to 5 days   Attendees: Patient: Angela Moran  05/18/2020   Physician: Bartholomew Crews, MD 05/18/2020   Nursing:  05/18/2020   RN Care Manager: 05/18/2020   Social Worker: Melba Coon, LCSWA  05/18/2020   Recreational Therapist:  05/18/2020   Other:  05/18/2020   Other:  05/18/2020   Other: 05/18/2020     Scribe for Treatment Team: Aram Beecham, LCSWA 05/18/2020 2:47 PM

## 2020-05-18 NOTE — Tx Team (Signed)
Initial Treatment Plan 05/18/2020 6:53 AM Angela Moran GHW:[id number]    PATIENT STRESSORS: Medication change or noncompliance Substance abuse   PATIENT STRENGTHS: Supportive family/friends   PATIENT IDENTIFIED PROBLEMS: Polysubstance Abuse  Alcohol abuse  Medication non-compliance                 DISCHARGE CRITERIA:  Withdrawal symptoms are absent or subacute and managed without 24-hour nursing intervention  PRELIMINARY DISCHARGE PLAN: Return to previous living arrangement  PATIENT/FAMILY INVOLVEMENT: This treatment plan has been presented to and reviewed with the patient, Angela Moran, and/or family member. The patient and family have been given the opportunity to ask questions and make suggestions.  Aris Everts, RN 05/18/2020, 6:53 AM

## 2020-05-19 LAB — HEPATITIS PANEL, ACUTE
HCV Ab: NONREACTIVE
Hep A IgM: NONREACTIVE
Hep B C IgM: NONREACTIVE
Hepatitis B Surface Ag: NONREACTIVE

## 2020-05-19 LAB — TSH: TSH: 0.104 u[IU]/mL — ABNORMAL LOW (ref 0.350–4.500)

## 2020-05-19 LAB — HIV ANTIBODY (ROUTINE TESTING W REFLEX): HIV Screen 4th Generation wRfx: NONREACTIVE

## 2020-05-19 LAB — T4, FREE: Free T4: 0.95 ng/dL (ref 0.61–1.12)

## 2020-05-19 MED ORDER — BACITRACIN-NEOMYCIN-POLYMYXIN OINTMENT TUBE
TOPICAL_OINTMENT | Freq: Three times a day (TID) | CUTANEOUS | Status: DC
  Filled 2020-05-19: qty 14.17

## 2020-05-19 MED ORDER — MUPIROCIN CALCIUM 2 % EX CREA
TOPICAL_CREAM | Freq: Two times a day (BID) | CUTANEOUS | Status: DC
  Filled 2020-05-19: qty 15
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
## 2020-05-19 NOTE — Progress Notes (Signed)
D Alert and Oriented with ongoing Passive SI, Patient complaining of anxiety and depression very anxious and tearful this shift PRN atarax and ativan (see Mar).  A Scheduled medications administered per Provider order. Support and encouragement provided. Routine safety checks conducted every 15 minutes. Patient notified to inform staff with problems or concerns.  R. No adverse drug reactions noted. Prn medications effective, Patient in dayroom watching TV.  Patient verbally contracts for safety at this time. Will continue to monitor.

## 2020-05-19 NOTE — Progress Notes (Signed)
Pt is irritable tonight. Pt reports having a horrible day and that she only slept. Her stressor has been not getting along well with her boyfriend and her goal is to get back on her medications. Encouraged pt to attend groups and stay out of her room as much as possible. Pt endorses withdrawal symptoms of sweating, agitation, anxiety, and diarrhea. Administered 2 mg of imodium at 2114 and 1 mg of ativan at 2115. Pt is passively suicidal without a plan. She verbally contracts for safety. Pt agrees to notify staff immediately for any thoughts of hurting herself or anyone else. She denies AVH. Active listening, reassurance, and support provided. Medications administered as ordered by MD. Q 15 min safety checks continue. Pt's safety has been maintained.   05/18/20 2115  Psych Admission Type (Psych Patients Only)  Admission Status Voluntary  Psychosocial Assessment  Patient Complaints Agitation;Anxiety;Depression;Irritability;Worrying  Eye Contact Avertive;Brief  Facial Expression Anxious;Sad;Worried  Affect Anxious;Depressed;Irritable;Sad  Speech Logical/coherent  Interaction Assertive;Isolative  Motor Activity Other (Comment) (WDL)  Appearance/Hygiene Disheveled  Behavior Characteristics Cooperative;Anxious;Fidgety;Irritable  Mood Depressed;Anxious;Irritable;Sad  Thought Process  Coherency WDL  Content Blaming others  Delusions None reported or observed  Perception WDL  Hallucination None reported or observed  Judgment Impaired  Confusion None  Danger to Self  Current suicidal ideation? Passive  Self-Injurious Behavior No self-injurious ideation or behavior indicators observed or expressed   Agreement Not to Harm Self Yes  Description of Agreement verbally agrees to notify staff immediately for any thoughts of hurting herself or anyone else  Danger to Others  Danger to Others None reported or observed

## 2020-05-19 NOTE — Progress Notes (Signed)
Adult Psychoeducational Group Note  Date:  05/19/2020 Time:  8:44 PM  Group Topic/Focus:  Wrap-Up Group:   The focus of this group is to help patients review their daily goal of treatment and discuss progress on daily workbooks.  Participation Level:  Did Not Attend  Participation Quality:  Did Not Attend  Affect:  Did Not Attend  Cognitive:  Did Not Attend  Insight: None  Engagement in Group:  Did Not Attend  Modes of Intervention:  Did Not Attend  Additional Comments:  Pt did not attend evening wrap up group tonight.  Felipa Furnace 05/19/2020, 8:44 PM

## 2020-05-19 NOTE — Progress Notes (Signed)
[facility] MD Progress Note  05/19/2020 9:45 AM Angela Moran  MRN:  [id number]  Subjective: Patient is a 30 year old femalewith a past psychiatric history significant for depression, drug-induced seizures, polysubstance abuse, alcohol abuse who presented to the behavioral health center assessment service on 05/17/2020 with suicidal ideation and depression.  Objective: Patient is seen and examined.  Patient is a 30 year old female with the above-stated past psychiatric history who is seen in follow-up.  Patient was originally seen by the comprehensive clinical service at [facility] emergency department on 05/17/2020.  She admitted to having increasing suicidal ideation for the last 2 to 3 days prior to admission.  She wanted to overdose on substances.  She stated that she had used fentanyl, methamphetamines and alcohol recently.  Surprisingly her drug screen was negative.  She stated that she had been previously prescribed Suboxone, but had run out.  Review of the PMP database revealed her last prescription was in October 2021.  She stated today that is when she was released from [facility] from rehab.  She stated that after she had gotten out of rehab and run out of her medications things began to deteriorate.  She stated that she and her boyfriend had started using substances again.  On evaluation today she stated she still wants to die.  She stated the last time she did not want to die was on the medication she had received from [facility].  She stated that the child protective services have placed her 88-year-old son and her mother's custody, and the patient has limited visitation rights.  She is also currently unemployed and under great financial stressors.  She stated her boyfriend was homeless, and was "troublemaker".  The patient also reported that she has a court date on 06/18/2020 for stealing.  She was transferred to our facility for continued treatment.  She was placed on the opiate detox protocol  as well as lorazepam 1 mg p.o. every 6 hours as needed a CIWA>10.  Despite her history of a drug-induced seizure disorder she was noted to have "demanded" to be restarted on Wellbutrin.  According to checkout sheet the patient would not entertain any other antidepressant medications.  On her evaluation she was also noted to be Covid positive.  She is rather demanding, and does not have a great deal of self recognition.  Review of her admission laboratories revealed essentially normal electrolytes including liver function enzymes.  Lipid panel was normal.  Her hemoglobin and hematocrit were mildly elevated at 15.5 and 48.5.  Platelets were normal.  Acetaminophen on admission was less than 10, salicylate less than 7.  TSH was low at 0.274.  Beta-hCG was negative.  Hemoglobin A1c was 5.0.  Her respiratory panel was negative for influenza a and B, but positive for Covid.  Her blood alcohol on admission was 29.  Drug screen was completely negative.  EKG was essentially normal.  Her vital signs are stable, she is afebrile.  Her pulse oximetry on room air was 100%.  She slept 6 hours last night.  Her most recent CIWA was 2.  Principal Problem: MDD (major depressive disorder), recurrent episode, severe (HCC) Diagnosis: Principal Problem:   MDD (major depressive disorder), recurrent episode, severe (HCC) Active Problems:   Felon of finger   Alcohol abuse   Stimulant use disorder   Opiate abuse, continuous (HCC)   COVID-19  Total Time spent with patient: 20 minutes  Past Psychiatric History: See admission H&P  Past Medical History:  Past Medical  History:  Diagnosis Date  . Depression    doing ok  . HSV-2 infection   . Infection    UTI  . Pregnant   . 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 Vaginal Pap smear, abnormal    clear on repeat    Past Surgical History:  Procedure Laterality Date  . CESAREAN SECTION N/A 03/07/2018   Procedure: REPEAT CESAREAN SECTION;  Surgeon: Levie Heritage,  DO;  Location: [facility];  Service: Obstetrics;  Laterality: N/A;  . NO PAST SURGERIES     Family History:  Family History  Problem Relation Age of Onset  . Other Mother        Bone deteriation  . Alcohol abuse Mother   . Cirrhosis Father   . Alcohol abuse Father   . Diabetes Brother   . Alcohol abuse Maternal Grandmother   . Alcohol abuse Maternal Grandfather   . Alcohol abuse Paternal Grandmother   . Alcohol abuse Paternal Grandfather   . Autism Brother   . ADD / ADHD Brother    Family Psychiatric  History: See admission H&P Social History:  Social History   Substance and Sexual Activity  Alcohol Use Yes   Comment: 4 beers daily, 2-4 shots daily     Social History   Substance and Sexual Activity  Drug Use Not Currently  . Types: Marijuana, Cocaine, Heroin   Comment: last used last week    Social History   Socioeconomic History  . Marital status: Single    Spouse name: Not on file  . Number of children: Not on file  . Years of education: Not on file  . Highest education level: Not on file  Occupational History  . Not on file  Tobacco Use  . Smoking status: Current Every Day Smoker    Packs/day: 0.50    Years: 6.00    Pack years: 3.00    Types: Cigarettes  . Smokeless tobacco: Never Used  Vaping Use  . Vaping Use: Former  Substance and Sexual Activity  . Alcohol use: Yes    Comment: 4 beers daily, 2-4 shots daily  . Drug use: Not Currently    Types: Marijuana, Cocaine, Heroin    Comment: last used last week  . Sexual activity: Yes    Birth control/protection: I.U.D.  Other Topics Concern  . Not on file  Social History Narrative  . Not on file   Social Determinants of Health   Financial Resource Strain: Not on file  Food Insecurity: Not on file  Transportation Needs: Not on file  Physical Activity: Not on file  Stress: Not on file  Social Connections: Not on file   Additional Social History:                         Sleep:  Good  Appetite:  Good  Current Medications: Current Facility-Administered Medications  Medication Dose Route Frequency Provider Last Rate Last Admin  . acetaminophen (TYLENOL) tablet 650 mg  650 mg Oral Q6H PRN Nwoko, Uchenna E, PA      . alum & mag hydroxide-simeth (MAALOX/MYLANTA) 200-200-20 MG/5ML suspension 30 mL  30 mL Oral Q4H PRN Nwoko, Uchenna E, PA      . bacitracin ointment   Topical Daily Laveda Abbe, NP   [phone] application at 05/19/20 [phone]  . buPROPion (WELLBUTRIN XL) 24 hr tablet 150 mg  150 mg Oral Daily Laveda Abbe, NP  150 mg at 05/19/20 0915  . cloNIDine (CATAPRES) tablet 0.1 mg  0.1 mg Oral Q8H PRN Mason JimSingleton, Amy E, MD      . dicyclomine (BENTYL) tablet 20 mg  20 mg Oral Q6H PRN Mason JimSingleton, Amy E, MD      . doxycycline (VIBRA-TABS) tablet 100 mg  100 mg Oral Q12H Nwoko, Uchenna E, PA   100 mg at 05/19/20 0914  . hydrOXYzine (ATARAX/VISTARIL) tablet 25 mg  25 mg Oral Q6H PRN Comer LocketSingleton, Amy E, MD   25 mg at 05/19/20 0917  . loperamide (IMODIUM) capsule 2-4 mg  2-4 mg Oral PRN Comer LocketSingleton, Amy E, MD   2 mg at 05/18/20 2114  . LORazepam (ATIVAN) tablet 1 mg  1 mg Oral Q6H PRN Comer LocketSingleton, Amy E, MD   1 mg at 05/18/20 2115  . magnesium hydroxide (MILK OF MAGNESIA) suspension 30 mL  30 mL Oral Daily PRN Nwoko, Uchenna E, PA      . methocarbamol (ROBAXIN) tablet 500 mg  500 mg Oral Q8H PRN Mason JimSingleton, Amy E, MD      . multivitamin with minerals tablet 1 tablet  1 tablet Oral Daily Mason JimSingleton, Amy E, MD   1 tablet at 05/19/20 0914  . naproxen (NAPROSYN) tablet 500 mg  500 mg Oral BID PRN Comer LocketSingleton, Amy E, MD      . nicotine (NICODERM CQ - dosed in mg/24 hours) patch 21 mg  21 mg Transdermal Daily Antonieta Pertlary, Mackenzy Grumbine Lawson, MD   21 mg at 05/19/20 0915  . ondansetron (ZOFRAN-ODT) disintegrating tablet 4 mg  4 mg Oral Q6H PRN Mason JimSingleton, Amy E, MD      . thiamine tablet 100 mg  100 mg Oral Daily Mason JimSingleton, Amy E, MD   100 mg at 05/19/20 0915  . traZODone (DESYREL) tablet 50 mg   50 mg Oral QHS PRN Nwoko, Uchenna E, PA   50 mg at 05/18/20 2114    Lab Results:  Results for orders placed or performed during the hospital encounter of 05/17/20 (from the past 48 hour(s))  Hemoglobin A1c     Status: None   Collection Time: 05/18/20  7:31 AM  Result Value Ref Range   Hgb A1c MFr Bld 5.0 4.8 - 5.6 %    Comment: (NOTE) Pre diabetes:          5.7%-6.4%  Diabetes:              >6.4%  Glycemic control for   <7.0% adults with diabetes    Mean Plasma Glucose 96.8 mg/dL    Comment: Performed at [facility], [address] [place], [place] [address]  Lipid panel     Status: None   Collection Time: 05/18/20  7:31 AM  Result Value Ref Range   Cholesterol 100 0 - 200 mg/dL   Triglycerides 36 <191<150 mg/dL   HDL 42 >47>40 mg/dL   Total CHOL/HDL Ratio 2.4 RATIO   VLDL 7 0 - 40 mg/dL   LDL Cholesterol 51 0 - 99 mg/dL    Comment:        Total Cholesterol/HDL:CHD Risk Coronary Heart Disease Risk Table                     Men   Women  1/2 Average Risk   3.4   3.3  Average Risk       5.0   4.4  2 X Average Risk   9.6   7.1  3 X Average  Risk  23.4   11.0        Use the calculated Patient Ratio above and the CHD Risk Table to determine the patient's CHD Risk.        ATP III CLASSIFICATION (LDL):  <100     mg/dL   Optimal  [id number]  mg/dL   Near or Above                    Optimal  130-159  mg/dL   Borderline  [id number]  mg/dL   High  >811>190     mg/dL   Very High Performed at [facility], [address] [place], [place] [address]   TSH     Status: Abnormal   Collection Time: 05/18/20  7:31 AM  Result Value Ref Range   TSH 0.274 (L) 0.350 - 4.500 uIU/mL    Comment: Performed by a 3rd Generation assay with a functional sensitivity of <=0.01 uIU/mL. Performed at [facility], [address] [place], [place] [address]     Blood Alcohol level:  Lab Results  Component Value Date   ETH 29 (H) 05/17/2020   ETH 160 (H) 02/01/2019     Metabolic Disorder Labs: Lab Results  Component Value Date   HGBA1C 5.0 05/18/2020   MPG 96.8 05/18/2020   No results found for: PROLACTIN Lab Results  Component Value Date   CHOL 100 05/18/2020   TRIG 36 05/18/2020   HDL 42 05/18/2020   CHOLHDL 2.4 05/18/2020   VLDL 7 05/18/2020   LDLCALC 51 05/18/2020   LDLCALC 85 07/17/2017    Physical Findings: AIMS: Facial and Oral Movements Muscles of Facial Expression: None, normal Lips and Perioral Area: None, normal Jaw: None, normal Tongue: None, normal,Extremity Movements Upper (arms, wrists, hands, fingers): None, normal Lower (legs, knees, ankles, toes): None, normal, Trunk Movements Neck, shoulders, hips: None, normal, Overall Severity Severity of abnormal movements (highest score from questions above): None, normal Incapacitation due to abnormal movements: None, normal Patient's awareness of abnormal movements (rate only patient's report): No Awareness, Dental Status Current problems with teeth and/or dentures?: No Does patient usually wear dentures?: No  CIWA:  CIWA-Ar Total: 2 COWS:  COWS Total Score: 7  Musculoskeletal: Strength & Muscle Tone: within normal limits Gait & Station: normal Patient leans: N/A  Psychiatric Specialty Exam: Physical Exam Vitals and nursing note reviewed.  Constitutional:      Appearance: Normal appearance.  HENT:     Head: Normocephalic and atraumatic.  Pulmonary:     Effort: Pulmonary effort is normal.  Neurological:     General: No focal deficit present.     Mental Status: She is alert and oriented to person, place, and time.     Review of Systems  Blood pressure 108/77, pulse 95, temperature 98.4 F (36.9 C), temperature source Oral, resp. rate 18, SpO2 100 %.There is no height or weight on file to calculate BMI.  General Appearance: Fairly Groomed  Eye Contact:  Poor  Speech:  Normal Rate  Volume:  Normal  Mood:  Irritable  Affect:  Congruent  Thought Process:   Coherent and Descriptions of Associations: Circumstantial  Orientation:  Full (Time, Place, and Person)  Thought Content:  Rumination  Suicidal Thoughts:  Yes.  without intent/plan  Homicidal Thoughts:  No  Memory:  Immediate;   Fair Recent;   Fair Remote;   Fair  Judgement:  Impaired  Insight:  Lacking  Psychomotor Activity:  Normal  Concentration:  Concentration: Fair and Attention Span: Fair  Recall:  [employer] of Knowledge:  Fair  Language:  Good  Akathisia:  Negative  Handed:  Right  AIMS (if indicated):     Assets:  Desire for Improvement Resilience  ADL's:  Intact  Cognition:  WNL  Sleep:  Number of Hours: 6     Treatment Plan Summary: Daily contact with patient to assess and evaluate symptoms and progress in treatment, Medication management and Plan : Patient is seen and examined.  Patient is a 30 year old female with the above-stated past psychiatric history who is seen in follow-up.   Diagnosis: 1.  Unspecified depression versus substance-induced mood disorder versus major depression. 2.  Potentially borderline personality disorder. 3.  Opiate use disorder, severe, dependence 3.  Stimulant use disorder 4.  Alcohol use disorder 5.  Infected finger secondary to reported drug use. 6.  Covid positive  Pertinent findings on examination today: 1.  Patient remains irritable, suicidal, unhappy. 2.  Vital signs are stable, she is afebrile. 3.  CIWA was 2. 4.  Patient aware of possible decrease in seizure threshold with Wellbutrin.  Plan: 1.  Stop bacitracin and ointment to finger. 2.  Start Bactroban applied 3 times daily for possible MRSA wound infection. 3.  Continue Wellbutrin XL 150 mg p.o. daily for depression. 4.  Continue monitoring for CIWA and COWS. 5.  Continue clonidine 0.1 mg p.o. every 6 hours as needed a systolic blood pressure greater than 150. 6.  Continue Bentyl 20 mg p.o. every 6 hours as needed abdominal cramping. 7.  Continue doxycycline 100 mg  p.o. every 12 hours for r finger infection. 8.  Reorder acute hepatitis panel. 9.  Reorder HIV. 10.  Continue hydroxyzine 25 mg p.o. every 6 hours as needed anxiety. 11.  Continue Robaxin 500 mg p.o. every 6 hours as needed muscle spasms. 12.  Continue Naprosyn 500 mg p.o. twice daily as needed pain. 13.  Continue Zofran 4 mg p.o. every 6 hours.  And nausea and vomiting. 14.  Reorder T3 and T4. 15.  Continue thiamine 100 mg p.o. daily for nutritional supplementation. 16.  Continue trazodone 50 mg p.o. nightly as needed insomnia. 17.  Disposition planning-in progress.  Antonieta Pert, MD 05/19/2020, 9:45 AM

## 2020-05-20 NOTE — Progress Notes (Signed)
Pt was somewhat irritable this morning, but pt's mood has improved throughout the day.  Pt took medications without incident.  RN changed pt's dressing on pt's finger and applied prescribed medications for pt's finger infection.  Pt took a shower and has been spending time in the dayroom with peers.  Pt remains safe on the unit with q 15 min checks in place.

## 2020-05-20 NOTE — Progress Notes (Signed)
D: Patient presents with anxious affect and is minimal upon assessment but cooperative. Patient reports passive SI and denies HI at this time. Patient also denies AH/VH at this time. Patient contracts for safety.  A: Provided positive reinforcement and encouragement.  R: Patient cooperative and receptive to efforts. Patient remains safe on the unit.   05/19/20 2118  Psych Admission Type (Psych Patients Only)  Admission Status Voluntary  Psychosocial Assessment  Patient Complaints Anxiety  Eye Contact Brief;Avertive  Facial Expression Anxious  Affect Anxious;Irritable  Speech Logical/coherent  Interaction Assertive  Motor Activity Other (Comment) (WDL)  Appearance/Hygiene Disheveled  Behavior Characteristics Cooperative;Anxious  Mood Anxious  Thought Process  Coherency WDL  Content Blaming others  Delusions None reported or observed  Perception WDL  Hallucination None reported or observed  Judgment Impaired  Confusion None  Danger to Self  Current suicidal ideation? Passive  Self-Injurious Behavior No self-injurious ideation or behavior indicators observed or expressed   Agreement Not to Harm Self Yes  Description of Agreement Verbal contract  Danger to Others  Danger to Others None reported or observed

## 2020-05-20 NOTE — BHH Group Notes (Signed)
Adult Psychoeducational Group Note  Date:  05/20/2020 Time:  11:29 AM  Group Topic/Focus:  Goals Group:   The focus of this group is to help patients establish daily goals to achieve during treatment and discuss how the patient can incorporate goal setting into their daily lives to aide in recovery.  Participation Level:  Minimal  Participation Quality:  Appropriate  Affect:  Defensive  Cognitive:  Appropriate  Insight: Good  Engagement in Group:  Limited  Modes of Intervention:  Discussion  Additional Comments:  Pt attended most of morning goals group. Pt did not share a goal with the Clinical research associate.   Deforest Hoyles Ruthy Forry 05/20/2020, 11:29 AM

## 2020-05-20 NOTE — Progress Notes (Signed)
[facility] MD Progress Note  05/20/2020 10:31 AM Angela Moran  MRN:  [id number] Subjective:  Patient is a 30 year old femalewith a past psychiatric history significant for depression, drug-induced seizures, polysubstance abuse, alcohol abuse who presented to the behavioral health center assessment service on 05/17/2020 with suicidal ideation and depression.  Objective: Patient is seen and examined.  Patient is a 30 year old female with the above-stated past psychiatric history who is seen in follow-up.  She seems to be at least slightly less irritable today.  She did not grossly talk about suicidal ideation.  She stated she stays in her room and sleeps most the time because "I am bored".  She stated if there was something to do that she would be awake.  We discussed her aftercare plans.  She stated that she hopes to return to her boyfriend's sister's home.  We discussed isolation and confinement given her Covid diagnosis, but she stated "they all have it".  She denied auditory or visual hallucinations.  She denied any suicidal or homicidal ideation beyond her baseline.  Her vital signs are stable, she is afebrile.  Her most recent pulse oximetry was from 1/25 and was 100% on room air.  Her TSH from 05/19/2020 was 0.104.  T4 was 0.95 which is normal.  Principal Problem: MDD (major depressive disorder), recurrent episode, severe (HCC) Diagnosis: Principal Problem:   MDD (major depressive disorder), recurrent episode, severe (HCC) Active Problems:   Felon of finger   Alcohol abuse   Stimulant use disorder   Opiate abuse, continuous (HCC)   COVID-19  Total Time spent with patient: 15 minutes  Past Psychiatric History: See admission H&P  Past Medical History:  Past Medical History:  Diagnosis Date  . Depression    doing ok  . HSV-2 infection   . Infection    UTI  . Pregnant   . 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 Vaginal Pap smear, abnormal    clear on repeat    Past Surgical  History:  Procedure Laterality Date  . CESAREAN SECTION N/A 03/07/2018   Procedure: REPEAT CESAREAN SECTION;  Surgeon: Levie Heritage, DO;  Location: [facility];  Service: Obstetrics;  Laterality: N/A;  . NO PAST SURGERIES     Family History:  Family History  Problem Relation Age of Onset  . Other Mother        Bone deteriation  . Alcohol abuse Mother   . Cirrhosis Father   . Alcohol abuse Father   . Diabetes Brother   . Alcohol abuse Maternal Grandmother   . Alcohol abuse Maternal Grandfather   . Alcohol abuse Paternal Grandmother   . Alcohol abuse Paternal Grandfather   . Autism Brother   . ADD / ADHD Brother    Family Psychiatric  History: See admission H&P Social History:  Social History   Substance and Sexual Activity  Alcohol Use Yes   Comment: 4 beers daily, 2-4 shots daily     Social History   Substance and Sexual Activity  Drug Use Not Currently  . Types: Marijuana, Cocaine, Heroin   Comment: last used last week    Social History   Socioeconomic History  . Marital status: Single    Spouse name: Not on file  . Number of children: Not on file  . Years of education: Not on file  . Highest education level: Not on file  Occupational History  . Not on file  Tobacco Use  . Smoking status:  Current Every Day Smoker    Packs/day: 0.50    Years: 6.00    Pack years: 3.00    Types: Cigarettes  . Smokeless tobacco: Never Used  Vaping Use  . Vaping Use: Former  Substance and Sexual Activity  . Alcohol use: Yes    Comment: 4 beers daily, 2-4 shots daily  . Drug use: Not Currently    Types: Marijuana, Cocaine, Heroin    Comment: last used last week  . Sexual activity: Yes    Birth control/protection: I.U.D.  Other Topics Concern  . Not on file  Social History Narrative  . Not on file   Social Determinants of Health   Financial Resource Strain: Not on file  Food Insecurity: Not on file  Transportation Needs: Not on file  Physical Activity:  Not on file  Stress: Not on file  Social Connections: Not on file   Additional Social History:                         Sleep: Good  Appetite:  Fair  Current Medications: Current Facility-Administered Medications  Medication Dose Route Frequency Provider Last Rate Last Admin  . acetaminophen (TYLENOL) tablet 650 mg  650 mg Oral Q6H PRN Nwoko, Uchenna E, PA      . alum & mag hydroxide-simeth (MAALOX/MYLANTA) 200-200-20 MG/5ML suspension 30 mL  30 mL Oral Q4H PRN Nwoko, Uchenna E, PA      . buPROPion (WELLBUTRIN XL) 24 hr tablet 150 mg  150 mg Oral Daily Laveda Abbe, NP   150 mg at 05/20/20 0851  . cloNIDine (CATAPRES) tablet 0.1 mg  0.1 mg Oral Q8H PRN Mason Jim, Amy E, MD      . dicyclomine (BENTYL) tablet 20 mg  20 mg Oral Q6H PRN Mason Jim, Amy E, MD      . doxycycline (VIBRA-TABS) tablet 100 mg  100 mg Oral Q12H Nwoko, Uchenna E, PA   100 mg at 05/20/20 0851  . hydrOXYzine (ATARAX/VISTARIL) tablet 25 mg  25 mg Oral Q6H PRN Comer Locket, MD   25 mg at 05/20/20 0509  . loperamide (IMODIUM) capsule 2-4 mg  2-4 mg Oral PRN Comer Locket, MD   2 mg at 05/18/20 2114  . LORazepam (ATIVAN) tablet 1 mg  1 mg Oral Q6H PRN Comer Locket, MD   1 mg at 05/19/20 1203  . magnesium hydroxide (MILK OF MAGNESIA) suspension 30 mL  30 mL Oral Daily PRN Nwoko, Uchenna E, PA      . methocarbamol (ROBAXIN) tablet 500 mg  500 mg Oral Q8H PRN Comer Locket, MD   500 mg at 05/20/20 0904  . multivitamin with minerals tablet 1 tablet  1 tablet Oral Daily Comer Locket, MD   1 tablet at 05/20/20 0851  . mupirocin cream (BACTROBAN) 2 %   Topical BID Antonieta Pert, MD   Given at 05/20/20 [phone]  . naproxen (NAPROSYN) tablet 500 mg  500 mg Oral BID PRN Comer Locket, MD      . neomycin-bacitracin-polymyxin (NEOSPORIN) ointment   Topical TID Antonieta Pert, MD   Given at 05/20/20 [phone]  . nicotine (NICODERM CQ - dosed in mg/24 hours) patch 21 mg  21 mg Transdermal Daily  Antonieta Pert, MD   21 mg at 05/20/20 0849  . ondansetron (ZOFRAN-ODT) disintegrating tablet 4 mg  4 mg Oral Q6H PRN Comer Locket, MD      .  thiamine tablet 100 mg  100 mg Oral Daily Bartholomew Crews E, MD   100 mg at 05/20/20 0851  . traZODone (DESYREL) tablet 50 mg  50 mg Oral QHS PRN Nwoko, Uchenna E, PA   50 mg at 05/19/20 2118    Lab Results:  Results for orders placed or performed during the hospital encounter of 05/17/20 (from the past 48 hour(s))  Hepatitis panel, acute     Status: None   Collection Time: 05/19/20  7:30 PM  Result Value Ref Range   Hepatitis B Surface Ag NON REACTIVE NON REACTIVE   HCV Ab NON REACTIVE NON REACTIVE    Comment: (NOTE) Nonreactive HCV antibody screen is consistent with no HCV infections,  unless recent infection is suspected or other evidence exists to indicate HCV infection.     Hep A IgM NON REACTIVE NON REACTIVE   Hep B C IgM NON REACTIVE NON REACTIVE    Comment: Performed at [facility], [address] [place], [place] [address]  HIV Antibody (routine testing w rflx)     Status: None   Collection Time: 05/19/20  7:30 PM  Result Value Ref Range   HIV Screen 4th Generation wRfx Non Reactive Non Reactive    Comment: Performed at [facility], [address], [place], [place] [address]  T4, free     Status: None   Collection Time: 05/19/20  7:30 PM  Result Value Ref Range   Free T4 0.95 0.61 - 1.12 ng/dL    Comment: (NOTE) Biotin ingestion may interfere with free T4 tests. If the results are inconsistent with the TSH level, previous test results, or the clinical presentation, then consider biotin interference. If needed, order repeat testing after stopping biotin. Performed at [facility], [address] [place], [place] [address]   TSH     Status: Abnormal   Collection Time: 05/19/20  7:30 PM  Result Value Ref Range   TSH 0.104 (L) 0.350 - 4.500 uIU/mL    Comment: Performed by a 3rd Generation assay with a  functional sensitivity of <=0.01 uIU/mL. Performed at [facility], [address], [place], [place] [address]     Blood Alcohol level:  Lab Results  Component Value Date   ETH 29 (H) 05/17/2020   ETH 160 (H) 02/01/2019    Metabolic Disorder Labs: Lab Results  Component Value Date   HGBA1C 5.0 05/18/2020   MPG 96.8 05/18/2020   No results found for: PROLACTIN Lab Results  Component Value Date   CHOL 100 05/18/2020   TRIG 36 05/18/2020   HDL 42 05/18/2020   CHOLHDL 2.4 05/18/2020   VLDL 7 05/18/2020   LDLCALC 51 05/18/2020   LDLCALC 85 07/17/2017    Physical Findings: AIMS: Facial and Oral Movements Muscles of Facial Expression: None, normal Lips and Perioral Area: None, normal Jaw: None, normal Tongue: None, normal,Extremity Movements Upper (arms, wrists, hands, fingers): None, normal Lower (legs, knees, ankles, toes): None, normal, Trunk Movements Neck, shoulders, hips: None, normal, Overall Severity Severity of abnormal movements (highest score from questions above): None, normal Incapacitation due to abnormal movements: None, normal Patient's awareness of abnormal movements (rate only patient's report): No Awareness, Dental Status Current problems with teeth and/or dentures?: No Does patient usually wear dentures?: No  CIWA:  CIWA-Ar Total: 2 COWS:  COWS Total Score: 3  Musculoskeletal: Strength & Muscle Tone: within normal limits Gait & Station: normal Patient leans: N/A  Psychiatric Specialty Exam:  Physical Exam Vitals and nursing note reviewed.  Constitutional:      Appearance: Normal appearance.  HENT:     Head: Normocephalic and atraumatic.  Pulmonary:     Effort: Pulmonary effort is normal.  Neurological:     General: No focal deficit present.     Mental Status: She is alert and oriented to person, place, and time.     Review of Systems  Blood pressure 114/70, pulse 78, temperature 98.9 F (37.2 C), resp. rate 18, SpO2  100 %.There is no height or weight on file to calculate BMI.  General Appearance: Casual  Eye Contact:  Fair  Speech:  Normal Rate  Volume:  Normal  Mood:  Anxious and Irritable  Affect:  Congruent  Thought Process:  Coherent and Descriptions of Associations: Circumstantial  Orientation:  Full (Time, Place, and Person)  Thought Content:  Logical  Suicidal Thoughts:  No  Homicidal Thoughts:  No  Memory:  Immediate;   Fair Recent;   Fair Remote;   Fair  Judgement:  Intact  Insight:  Lacking  Psychomotor Activity:  Normal  Concentration:  Concentration: Fair and Attention Span: Fair  Recall:  [employer] of Knowledge:  Fair  Language:  Good  Akathisia:  Negative  Handed:  Right  AIMS (if indicated):     Assets:  Desire for Improvement Resilience  ADL's:  Intact  Cognition:  WNL  Sleep:  Number of Hours: 5.5     Treatment Plan Summary: Daily contact with patient to assess and evaluate symptoms and progress in treatment, Medication management and Plan : Patient is seen and examined.  Patient is a 30 year old female with the above-stated past psychiatric history who is seen in follow-up.   Diagnosis: 1.  Unspecified depression versus substance-induced mood disorder versus major depression. 2.  Potentially borderline personality disorder. 3.  Opiate use disorder, severe, dependence 3.  Stimulant use disorder 4.  Alcohol use disorder 5.  Infected finger secondary to reported drug use. 6.  Covid positive  Pertinent findings on examination today: 1.  Patient appears to be less irritable, less suicidal and continued "unhappiness". 2.  Vital signs are stable, she is afebrile. 3.  TSH was abnormal but T4 is normal.  Patient denied any history of thyroid problems. 4.  No side effects to current medications.  Plan: 1.  Awaiting results from T3 2.  Continue Bactroban applied 3 times daily for possible MRSA wound infection. 3.  Continue Wellbutrin XL 150 mg p.o. daily for  depression. 4.  Continue monitoring for CIWA and COWS. 5.  Continue clonidine 0.1 mg p.o. every 6 hours as needed a systolic blood pressure greater than 150. 6.  Continue Bentyl 20 mg p.o. every 6 hours as needed abdominal cramping. 7.  Continue doxycycline 100 mg p.o. every 12 hours for right finger infection. 8.  acute hepatitis panel all nonreactive. 9. HIV negative. 10.  Continue hydroxyzine 25 mg p.o. every 6 hours as needed anxiety. 11.  Continue Robaxin 500 mg p.o. every 6 hours as needed muscle spasms. 12.  Continue Naprosyn 500 mg p.o. twice daily as needed pain. 13.  Continue Zofran 4 mg p.o. every 6 hours.  And nausea and vomiting. 14.  Continue thiamine 100 mg p.o. daily for nutritional supplementation. 15.  Continue trazodone 50 mg p.o. nightly as needed insomnia. 16.  Disposition planning-in progress.  Antonieta Pert, MD 05/20/2020, 10:31 AM

## 2020-05-20 NOTE — Progress Notes (Signed)
Adult Psychoeducational Group Note  Date:  05/20/2020 Time:  9:49 PM  Group Topic/Focus:  Wrap-Up Group:   The focus of this group is to help patients review their daily goal of treatment and discuss progress on daily workbooks.  Participation Level:  Minimal  Participation Quality:  Appropriate  Affect:  Appropriate  Cognitive:  Appropriate  Insight: Appropriate  Engagement in Group:  Developing/Improving  Modes of Intervention:  Discussion  Additional Comments:  Pt stated her goal for today was to focus on her treatment plan and interact more with her peers in the dayroom instead of isolating herself in her room.  Pt stated she felt she accomplished her goals today. Pt stated she  talk with her doctor but not the social worker, regarding her care today. Pt stated been able to contact her sister and boyfriend today, improved her overall day. Pt stated she took all her medication today from her providers. Pt rated her overall day a 5 out of 10. Pt stated today was a much better day then yesterday. Pt stated she is still a little depressed. Pt stated her appetite was fair today and she received all her meals today. Pt stated her sleep last night was good. Pt stated the goal for tonight was to get some rest. Pt stated she was in no physical pain. Pt deny auditory or visual hallucinations. Pt denies thoughts of harming herself or others. Pt stated she would alert staff if anything changes.  Felipa Furnace 05/20/2020, 9:49 PM

## 2020-05-20 NOTE — [facility] Group Notes (Signed)
Type of Therapy: Group Therapy  Modes of Intervention: Boundaries   Due to the COVID-19 pandemic, this group has been supplemented with worksheets.  Summary of Progress/Problems: CSW provided worksheet to patient due to COVID. CSW offered to meet individually with patient as needed  Cheralyn Oliver, LCSWA Clinicial Social Worker [facility] 

## 2020-05-21 ENCOUNTER — Encounter (HOSPITAL_COMMUNITY): Payer: Self-pay

## 2020-05-21 ENCOUNTER — Emergency Department (HOSPITAL_COMMUNITY)
Admission: EM | Admit: 2020-05-21 | Discharge: 2020-05-21 | Disposition: A | Discharge status: Home / Self Care | Payer: Medicaid Other | Attending: Emergency Medicine | Admitting: Emergency Medicine

## 2020-05-21 ENCOUNTER — Other Ambulatory Visit: Payer: Self-pay

## 2020-05-21 DIAGNOSIS — U071 COVID-19: Secondary | ICD-10-CM | POA: Insufficient documentation

## 2020-05-21 DIAGNOSIS — L03011 Cellulitis of right finger: Secondary | ICD-10-CM | POA: Insufficient documentation

## 2020-05-21 DIAGNOSIS — F332 Major depressive disorder, recurrent severe without psychotic features: Principal | ICD-10-CM

## 2020-05-21 DIAGNOSIS — F17210 Nicotine dependence, cigarettes, uncomplicated: Secondary | ICD-10-CM | POA: Insufficient documentation

## 2020-05-21 LAB — T3, FREE: T3, Free: 2.1 pg/mL (ref 2.0–4.4)

## 2020-05-21 MED ORDER — NICOTINE 21 MG/24HR TD PT24: 21.0000 mg | MEDICATED_PATCH | Freq: Every day | TRANSDERMAL | 0 refills | Status: DC

## 2020-05-21 MED ORDER — TRAZODONE HCL 50 MG PO TABS: 50.0000 mg | ORAL_TABLET | Freq: Every evening | ORAL | 0 refills | Status: DC | PRN

## 2020-05-21 MED ORDER — HYDROXYZINE HCL 25 MG PO TABS: 25.0000 mg | ORAL_TABLET | Freq: Three times a day (TID) | ORAL | 0 refills | Status: DC | PRN

## 2020-05-21 MED ORDER — BACITRACIN-NEOMYCIN-POLYMYXIN OINTMENT TUBE: 1.0000 "application " | TOPICAL_OINTMENT | Freq: Three times a day (TID) | CUTANEOUS | Status: DC

## 2020-05-21 MED ORDER — BUPROPION HCL ER (XL) 150 MG PO TB24: 150.0000 mg | ORAL_TABLET | Freq: Every day | ORAL | 0 refills | Status: DC

## 2020-05-21 MED ORDER — MUPIROCIN CALCIUM 2 % EX CREA: TOPICAL_CREAM | Freq: Two times a day (BID) | CUTANEOUS | 0 refills | Status: DC

## 2020-05-21 MED ORDER — DOXYCYCLINE HYCLATE 100 MG PO TABS: 100.0000 mg | ORAL_TABLET | Freq: Two times a day (BID) | ORAL | Status: DC

## 2020-05-21 NOTE — Discharge Summary (Signed)
Physician Discharge Summary Note  Patient:  Angela Moran is an 30 y.o., female  MRN:  [id number]  DOB:  [birth year]  Patient phone:  [phone] (home)   Patient address:   [address] [place] [address],   Total Time spent with patient: Greater than 30 minutes  Date of Admission:  05/17/2020  Date of Discharge: 05-21-20  Reason for Admission: Suicidal ideation triggered by worsening drugs/alcohol abuse with plan to overdose on substances.  Principal Problem: MDD (major depressive disorder), recurrent episode, severe (HCC)  Discharge Diagnoses: Principal Problem:   MDD (major depressive disorder), recurrent episode, severe (HCC) Active Problems:   Felon of finger   Alcohol abuse   Stimulant use disorder   Opiate abuse, continuous (HCC)   COVID-19  Past Psychiatric History: Major depressive disorder  Past Medical History:  Past Medical History:  Diagnosis Date  . Depression    doing ok  . HSV-2 infection   . Infection    UTI  . Pregnant   . 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 Vaginal Pap smear, abnormal    clear on repeat    Past Surgical History:  Procedure Laterality Date  . CESAREAN SECTION N/A 03/07/2018   Procedure: REPEAT CESAREAN SECTION;  Surgeon: Levie Heritage, DO;  Location: [facility];  Service: Obstetrics;  Laterality: N/A;  . NO PAST SURGERIES     Family History:  Family History  Problem Relation Age of Onset  . Other Mother        Bone deteriation  . Alcohol abuse Mother   . Cirrhosis Father   . Alcohol abuse Father   . Diabetes Brother   . Alcohol abuse Maternal Grandmother   . Alcohol abuse Maternal Grandfather   . Alcohol abuse Paternal Grandmother   . Alcohol abuse Paternal Grandfather   . Autism Brother   . ADD / ADHD Brother    Family Psychiatric  History: See H&P  Social History:  Social History   Substance and Sexual Activity  Alcohol Use Yes   Comment: 4 beers daily, 2-4 shots daily      Social History   Substance and Sexual Activity  Drug Use Not Currently  . Types: Marijuana, Cocaine, Heroin   Comment: last used last week    Social History   Socioeconomic History  . Marital status: Single    Spouse name: Not on file  . Number of children: Not on file  . Years of education: Not on file  . Highest education level: Not on file  Occupational History  . Not on file  Tobacco Use  . Smoking status: Current Every Day Smoker    Packs/day: 0.50    Years: 6.00    Pack years: 3.00    Types: Cigarettes  . Smokeless tobacco: Never Used  Vaping Use  . Vaping Use: Former  Substance and Sexual Activity  . Alcohol use: Yes    Comment: 4 beers daily, 2-4 shots daily  . Drug use: Not Currently    Types: Marijuana, Cocaine, Heroin    Comment: last used last week  . Sexual activity: Yes    Birth control/protection: I.U.D.  Other Topics Concern  . Not on file  Social History Narrative  . Not on file   Social Determinants of Health   Financial Resource Strain: Not on file  Food Insecurity: Not on file  Transportation Needs: Not on file  Physical Activity: Not on file  Stress: Not  on file  Social Connections: Not on file   Hospital Course: (See Md's admission evaluation notes): Patient is a 30 y/o female with a self reported h/o anxiety and depression who was admitted for suicidal ideation in the context of relapse on substances including alcohol, methamphetamine, and opiates. She states she was previously prescribed Suboxone while at [facility] and finished their rehab program, but she ran out of the medication 4 weeks ago and relapsed with Fentanyl, "ice," and alcohol recently. She admits to recent IVD use but does not quantify pattern or quantity of recent drug use. She states that she "left home" to live with her boyfriend in a tent for 3 weeks and has just recently opted to "move home" with her boyfriend's sister. She states her boyfriend is still homeless and cannot  return to the home "because he is a bad person." She reports onset of suicidal thinking 5 days ago with plan to overdose on substances. She recalls being on Wellbutrin XL 300mg  daily while at [facility] but cannot recall how long she has been off this antidepressant. She denies AVH, delusions, or paranoia. She denied h/o manic/hypomanic behaviors in the past. She denied any active COVID symptoms other than feeling "hot" with questionable fevers and report of "burning" sensation when she breaths. She denied CP or SOB. She reports a vague injury to her right index finger which has resulted in infection but cannot give details as to how she injured her finger. She denies injecting drugs into this finger. She would not cooperate for additional questions.  Although with an extensive hx of possible chronic mental health issues, polysubstance use disorders & multiple psychiatric treatments , this is Angela Moran's first psychiatric admission/discharge summary from this [facility]. She was admitted with complaint of suicidal ideation triggered by worsening drugs/alcohol abuse with plan to overdose on substances. After evaluation of her presenting symptoms, Angela Moran  was recommended for mood stabilization treatments by her treatment team. And with her consent, she was treated, stabilized & discharged on the medications as listed below on her discharge medication lists. She was also enrolled & participated in the group counseling sessions being offered & held on this unit. She learned coping skills. She presented on this admission, other significant pre-existing medical conditions (infected finger) that required treatment or monitoring. She was treated with antibiotics regimen, received wound care with a recommendation & instruction to stop at the [facility] ED upon discharge for a culture of the drainage of her finger wound. She tolerated her treatment regimen without any adverse effects or reactions reported.   During the course  of her hospitalization, the 15-minute checks were adequate to ensure Angela Moran's safety. Patient did not display any dangerous, violent or suicidal behavior on the unit.  She interacted with patients & staff appropriately. She participated appropriately in the group sessions/therapies. She learned coping skills. Her medications were addressed & adjusted to meet her needs. She was recommended for outpatient follow-up care & medication management upon discharge to assure her continuity of care.  At the time of discharge patient is not reporting any acute suicidal/homicidal ideations. She feels more confident about her self & mental health care. She currently denies any new issues or concerns. Education and supportive counseling provided throughout her hospital stay & upon discharge.   Today upon her discharge evaluation with the attending psychiatrist, Angela Moran shares she is doing well. She denies any other specific concerns. She is sleeping well. Her appetite is good. She denies other physical complaints. She denies AH/VH,  delusional thoughts or paranoia. She feels that her medications have been helpful & is in agreement to continue her current treatment regimen as recommended. She was provided with the remaining doses of her antibiotic therapy for her wound infection [facility] to complete at home. She was able to engage in safety planning including [facility] or contact emergency services if she feels unable to maintain her own safety or the safety of others. Pt had no further questions, comments, or concerns. [facility] personal belongings in no apparent distress. Transportation per the safe transport services.  Physical Findings: AIMS: Facial and Oral Movements Muscles of Facial Expression: None, normal Lips and Perioral Area: None, normal Jaw: None, normal Tongue: None, normal,Extremity Movements Upper (arms, wrists, hands, fingers): None, normal Lower (legs, knees, ankles,  toes): None, normal, Trunk Movements Neck, shoulders, hips: None, normal, Overall Severity Severity of abnormal movements (highest score from questions above): None, normal Incapacitation due to abnormal movements: None, normal Patient's awareness of abnormal movements (rate only patient's report): No Awareness, Dental Status Current problems with teeth and/or dentures?: No Does patient usually wear dentures?: No  CIWA:  CIWA-Ar Total: 2 COWS:  COWS Total Score: 2  Musculoskeletal: Strength & Muscle Tone: within normal limits Gait & Station: normal Patient leans: N/A  Psychiatric Specialty Exam: Physical Exam Vitals and nursing note reviewed.  HENT:     Head: Normocephalic.     Nose: Nose normal.     Mouth/Throat:     Pharynx: Oropharynx is clear.  Eyes:     Pupils: Pupils are equal, round, and reactive to light.  Cardiovascular:     Rate and Rhythm: Normal rate.     Pulses: Normal pulses.  Pulmonary:     Effort: Pulmonary effort is normal.  Abdominal:     Palpations: Abdomen is soft.  Genitourinary:    Comments: Deferred Musculoskeletal:        General: Normal range of motion.     Cervical back: Normal range of motion.  Skin:    General: Skin is warm and dry.  Neurological:     General: No focal deficit present.     Mental Status: She is alert and oriented to person, place, and time.     Review of Systems  Constitutional: Negative for chills, diaphoresis and fever.  HENT: Negative for congestion, rhinorrhea, sneezing and sore throat.   Eyes: Negative for discharge.  Respiratory: Negative for cough, shortness of breath and wheezing.   Cardiovascular: Negative for chest pain and palpitations.  Gastrointestinal: Negative for diarrhea, nausea and vomiting.  Endocrine: Negative for cold intolerance.  Genitourinary: Negative for difficulty urinating.  Musculoskeletal: Negative for arthralgias and myalgias.  Skin: Negative.   Allergic/Immunologic: Negative for  environmental allergies and food allergies.       Allergies: NKDA  Neurological: Negative for dizziness, tremors, seizures, syncope, facial asymmetry, speech difficulty, weakness, light-headedness, numbness and headaches.  Psychiatric/Behavioral: Positive for dysphoric mood (Stabilized with medication prior to discharge) and sleep disturbance (Stabilized with medication prior to discharge). Negative for agitation, behavioral problems, confusion, decreased concentration, self-injury and suicidal ideas. The patient is not nervous/anxious (Stable upon discharge) and is not hyperactive.     Blood pressure 107/72, pulse 82, temperature 97.7 F (36.5 C), temperature source Oral, resp. rate 16, SpO2 100 %.There is no height or weight on file to calculate BMI.  Sleep:  Number of Hours: 6.25   Has this patient used any form of tobacco in  the last 30 days? (Cigarettes, Smokeless Tobacco, Cigars, and/or Pipes): Yes, an FDA-approved tobacco cessation medication was offered at discharge.  Blood Alcohol level:  Lab Results  Component Value Date   ETH 29 (H) 05/17/2020   ETH 160 (H) 02/01/2019   Metabolic Disorder Labs:  Lab Results  Component Value Date   HGBA1C 5.0 05/18/2020   MPG 96.8 05/18/2020   No results found for: PROLACTIN Lab Results  Component Value Date   CHOL 100 05/18/2020   TRIG 36 05/18/2020   HDL 42 05/18/2020   CHOLHDL 2.4 05/18/2020   VLDL 7 05/18/2020   LDLCALC 51 05/18/2020   LDLCALC 85 07/17/2017   See Psychiatric Specialty Exam and Suicide Risk Assessment completed by Attending Physician prior to discharge.  Discharge destination:  Home  Is patient on multiple antipsychotic therapies at discharge:  No   Has Patient had three or more failed trials of antipsychotic monotherapy by history:  No  Recommended Plan for Multiple Antipsychotic Therapies: NA Discharge Instructions    Discharge wound care:   Complete by: As directed    You are hereby instructed & cautioned  to go to the [facility] ED today upon discharge to have the drainage from your finger wound cultured !!     Allergies as of 05/21/2020   No Known Allergies     Medication List    STOP taking these medications   benzonatate 100 MG capsule Commonly known as: TESSALON   doxycycline 100 MG capsule Commonly known as: VIBRAMYCIN Replaced by: doxycycline 100 MG tablet   ibuprofen 800 MG tablet Commonly known as: ADVIL   lidocaine 2 % solution Commonly known as: XYLOCAINE   promethazine 25 MG tablet Commonly known as: PHENERGAN   valACYclovir 1000 MG tablet Commonly known as: VALTREX     TAKE these medications     Indication  buPROPion 150 MG 24 hr tablet Commonly known as: WELLBUTRIN XL Take 1 tablet (150 mg total) by mouth daily. For depression Start taking on: May 22, 2020  Indication: Major Depressive Disorder   doxycycline 100 MG tablet Commonly known as: VIBRA-TABS Take 1 tablet (100 mg total) by mouth every 12 (twelve) hours. For wound infection Replaces: doxycycline 100 MG capsule  Indication: Wound infection   hydrOXYzine 25 MG tablet Commonly known as: ATARAX/VISTARIL Take 1 tablet (25 mg total) by mouth 3 (three) times daily as needed for anxiety. What changed: reasons to take this  Indication: Feeling Anxious   mupirocin cream 2 % Commonly known as: BACTROBAN Apply topically 2 (two) times daily. For wound care  Indication: Wound Infection   neomycin-bacitracin-polymyxin Oint Commonly known as: NEOSPORIN Apply 1 application topically 3 (three) times daily. For wound care  Indication: Wound care   nicotine 21 mg/24hr patch Commonly known as: NICODERM CQ - dosed in mg/24 hours Place 1 patch (21 mg total) onto the skin daily. (May buy from over the counter): For smoking cessation Start taking on: May 22, 2020  Indication: Nicotine Addiction   traZODone 50 MG tablet Commonly known as: DESYREL Take 1 tablet (50 mg total) by mouth at  bedtime as needed for sleep.  Indication: Trouble Sleeping       Follow-up Information    [facility]. Go on 05/27/2020.   Specialty: Behavioral Health Why: You have an appointment to start substance abuse intensive outpatient therapy service on 05/27/20 at 1:00 pm.   You also have a walk in appointment for medication management on 06/17/20  at 7:45 am. (Walk in appointments are first come, first served)  Contact information: [address] [place] [address] [phone]             Follow-up recommendations: Activity:  As tolerated Diet: As recommended by your primary care doctor. Keep all scheduled follow-up appointments as recommended.  Comments: Prescriptions given at discharge.  Patient agreeable to plan.  Given opportunity to ask questions.  Appears to feel comfortable with discharge denies any current suicidal or homicidal thought. Patient is also instructed prior to discharge to: Take all medications as prescribed by his/her mental healthcare provider. Report any adverse effects and or reactions from the medicines to his/her outpatient provider promptly. Patient has been instructed & cautioned: To not engage in alcohol and or illegal drug use while on prescription medicines. In the event of worsening symptoms, patient is instructed to call the crisis hotline, 911 and or go to the nearest ED for appropriate evaluation and treatment of symptoms. To follow-up with his/her primary care provider for your other medical issues, concerns and or health care needs.  Signed: Armandina Stammer, NP, PMHNP, FNP-BC 05/21/2020, 10:16 AM

## 2020-05-21 NOTE — BHH Suicide Risk Assessment (Signed)
[facility] Discharge Suicide Risk Assessment   Principal Problem: MDD (major depressive disorder), recurrent episode, severe (HCC) Discharge Diagnoses: Principal Problem:   MDD (major depressive disorder), recurrent episode, severe (HCC) Active Problems:   Felon of finger   Alcohol abuse   Stimulant use disorder   Opiate abuse, continuous (HCC)   COVID-19   Total Time spent with patient: 20 minutes  Musculoskeletal: Strength & Muscle Tone: within normal limits Gait & Station: normal Patient leans: N/A  Psychiatric Specialty Exam: Review of Systems  Skin: Positive for color change, pallor and wound.  All other systems reviewed and are negative.   Blood pressure 107/72, pulse 82, temperature 97.7 F (36.5 C), temperature source Oral, resp. rate 16, SpO2 100 %.There is no height or weight on file to calculate BMI.  General Appearance: Fairly Groomed  Patent attorney::  Fair  Speech:  Normal [id number]  Volume:  Normal  Mood:  Anxious  Affect:  Congruent  Thought Process:  Coherent and Descriptions of Associations: Intact  Orientation:  Full (Time, Place, and Person)  Thought Content:  Logical  Suicidal Thoughts:  No  Homicidal Thoughts:  No  Memory:  Immediate;   Fair Recent;   Fair Remote;   Fair  Judgement:  Intact  Insight:  Lacking  Psychomotor Activity:  Normal  Concentration:  Fair  Recall:  [employer] of Knowledge:Fair  Language: Good  Akathisia:  Negative  Handed:  Right  AIMS (if indicated):     Assets:  Desire for Improvement Resilience  Sleep:  Number of Hours: 6.25  Cognition: WNL  ADL's:  Intact   Mental Status Per Nursing Assessment::   On Admission:  Suicidal ideation indicated by patient  Demographic Factors:  Caucasian, Low socioeconomic status and Unemployed  Loss Factors: Financial problems/change in socioeconomic status  Historical Factors: Impulsivity  Risk Reduction Factors:   Living with another person, especially a relative  Continued  Clinical Symptoms:  Depression:   Comorbid alcohol abuse/dependence Impulsivity Alcohol/Substance Abuse/Dependencies  Cognitive Features That Contribute To Risk:  Thought constriction (tunnel vision)    Suicide Risk:  Minimal: No identifiable suicidal ideation.  Patients presenting with no risk factors but with morbid ruminations; may be classified as minimal risk based on the severity of the depressive symptoms   Follow-up Information    [facility]. Go on 05/27/2020.   Specialty: Behavioral Health Why: You have an appointment to start substance abuse intensive outpatient therapy service on 05/27/20 at 1:00 pm.   You also have a walk in appointment for medication management on 06/17/20 at 7:45 am. (Walk in appointments are first come, first served)  Contact information: [address] [place] [address] [phone]              Plan Of Care/Follow-up recommendations:  Activity:  ad lib Other:  Go to [facility] Emergency department after discharge to have your finger infection re-evaluated. Please wear a mask given your recent COVID positive test. Isolate in your home for at least 5 more days to avoid spreading possible COVID infection.  Antonieta Pert, MD 05/21/2020, 8:26 AM

## 2020-05-21 NOTE — Progress Notes (Signed)
RN met with pt and reviewed pt's discharge instructions.  Pt verbalized understanding of discharge instructions and pt did not have any questions. RN reviewed and provided pt with a copy of SRA, AVS and Transition Record.  RN returned pt's belongings to pt.  Prescriptions and medication samples were given to pt.  Pt denied SI/HI/AVH and voiced no concerns.  Pt was appreciative of the care pt received at [facility].  Safe transport driver taking pt to [facility] for evaluation on pt's finger.  Pt's friend will be picking pt up at ED.

## 2020-05-21 NOTE — Progress Notes (Signed)
D: Patient presents with anxious and depressed affect. Patient reports that she slept most of the day. Patient reports that she feels she has no support or anyone to talk to when she is feeling sad or depressed. Patient is cooperative and pleasant at time of assessment. Patient denies SI/HI at this time. Patient also denies AH/VH at this time. Patient contracts for safety.  A: Provided positive reinforcement and encouragement.  R: Patient cooperative and receptive to efforts. Patient remains safe on the unit.   05/20/20 2119  Psych Admission Type (Psych Patients Only)  Admission Status Voluntary  Psychosocial Assessment  Patient Complaints Anxiety;Depression  Eye Contact Brief  Facial Expression Sad  Affect Anxious;Depressed  Speech Logical/coherent  Interaction Assertive  Motor Activity Other (Comment) (WDL)  Appearance/Hygiene Unremarkable  Behavior Characteristics Cooperative;Appropriate to situation  Mood Depressed;Sad  Thought Process  Coherency WDL  Content Blaming others  Delusions None reported or observed  Perception WDL  Hallucination None reported or observed  Judgment Impaired  Confusion None  Danger to Self  Current suicidal ideation? Denies  Self-Injurious Behavior No self-injurious ideation or behavior indicators observed or expressed   Agreement Not to Harm Self Yes  Description of Agreement verbal contract  Danger to Others  Danger to Others None reported or observed

## 2020-05-21 NOTE — Progress Notes (Signed)
  [facility] Adult Case Management Discharge Plan :  Will you be returning to the same living situation after discharge:  No.WLED then friend's home At discharge, do you have transportation home?: Yes,  Safe Transport to [facility] and then pick-up by friend Do you have the ability to pay for your medications: No.  Release of information consent forms completed and in the chart;  Patient's signature needed at discharge.  Patient to Follow up at:  Follow-up Information    [facility]. Go on 05/27/2020.   Specialty: Behavioral Health Why: You have an appointment to start substance abuse intensive outpatient therapy service on 05/27/20 at 1:00 pm.   You also have a walk in appointment for medication management on 06/17/20 at 7:45 am. (Walk in appointments are first come, first served)  Contact information: [address] [place] [address] [phone]              Next level of care provider has access to [facility] Link:yes  Safety Planning and Suicide Prevention discussed: Yes,  with sister in law     Has patient been referred to the Quitline?: Patient refused referral  Patient has been referred for addiction treatment: Yes, [facility]OP at [facility], LCSWA 05/21/2020, 9:42 AM

## 2020-05-21 NOTE — ED Provider Notes (Signed)
I provided a substantive portion of the care of this patient.  I personally performed the entirety of the medical decision making for this encounter.   Patient here for recheck of index finger infection.  No signs of extensive cellulitis.  Healing wound noted.  Will continue antibiotics      Lorre Nick, MD 05/21/20 1337

## 2020-05-21 NOTE — Discharge Instructions (Addendum)
Take antibiotics as prescribed today, prescription was sent to the [facility] in [place] on [address]. Recommend bacitracin to the finger twice daily. Recommend warm water soaks with gentle range of motion for 20 minutes at a time 2-3 times daily.  Be sure to dry finger well after soaking.

## 2020-05-21 NOTE — BHH Counselor (Signed)
CSW attempted to contact Stacie Glaze (sis-n-law) [phone] to inquire if pt can return to her home. CSW was unable to reach her and voicemail box is not currently set up.   Fredirick Lathe, LCSWA Clinicial Social Worker [employer]

## 2020-05-21 NOTE — BHH Counselor (Signed)
CSW contacted pt's friend Jomarie Longs [phone], and confirmed that pt can come stay with him at d/c. Caller stated that he would pick pt up from [facility]. CSW informed Dr. Jola Babinski and pt of this information.   Fredirick Lathe, LCSWA Clinicial Social Worker [employer]

## 2020-05-21 NOTE — ED Provider Notes (Signed)
[facility]-EMERGENCY DEPT Provider Note   CSN: [id number] Arrival date & time: 05/21/20  1217     History Chief Complaint  Patient presents with  . Covid Positive  . Finger Injury    Angela Moran is a 30 y.o. female.  30 year old female presents with complaint of right index finger wound.  Patient was recently in the emergency room, and had a paronychia of the finger drained and was transferred to [facility], was discharged today with a prescription for doxycycline.  Patient states that she has peeled several layers of skin off of her finger, no active drainage.  No other complaints or concerns.        Past Medical History:  Diagnosis Date  . Depression    doing ok  . HSV-2 infection   . Infection    UTI  . Pregnant   . 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 Vaginal Pap smear, abnormal    clear on repeat    Patient Active Problem List   Diagnosis Date Noted  . Felon of finger 05/18/2020  . Alcohol abuse 05/18/2020  . Stimulant use disorder 05/18/2020  . Opiate abuse, continuous (HCC) 05/18/2020  . COVID-19 05/18/2020  . MDD (major depressive disorder), recurrent episode, severe (HCC) 05/17/2020  . Alcohol use 04/13/2018  . Status post cesarean section 03/07/2018  . 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) 02/23/2018  . 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 Smoker 12/18/2017  . History of illicit drug use 07/17/2017  . Depression with anxiety 12/04/2013    Past Surgical History:  Procedure Laterality Date  . CESAREAN SECTION N/A 03/07/2018   Procedure: REPEAT CESAREAN SECTION;  Surgeon: Levie Heritage, DO;  Location: [facility];  Service: Obstetrics;  Laterality: N/A;  . NO PAST SURGERIES       OB History    Gravida  1   Para  1   Term  1   Preterm  0   AB  0   Living  1     SAB  0   IAB  0   Ectopic  0   Multiple  0   Live Births  1           Family History  Problem Relation  Age of Onset  . Other Mother        Bone deteriation  . Alcohol abuse Mother   . Cirrhosis Father   . Alcohol abuse Father   . Diabetes Brother   . Alcohol abuse Maternal Grandmother   . Alcohol abuse Maternal Grandfather   . Alcohol abuse Paternal Grandmother   . Alcohol abuse Paternal Grandfather   . Autism Brother   . ADD / ADHD Brother     Social History   Tobacco Use  . Smoking status: Current Every Day Smoker    Packs/day: 0.50    Years: 6.00    Pack years: 3.00    Types: Cigarettes  . Smokeless tobacco: Never Used  Vaping Use  . Vaping Use: Former  Substance Use Topics  . Alcohol use: Yes    Comment: 4 beers daily, 2-4 shots daily  . Drug use: Not Currently    Types: Marijuana, Cocaine, Heroin    Comment: last used last week    Home Medications Prior to Admission medications   Medication Sig Start Date End Date Taking? Authorizing Provider  buPROPion (WELLBUTRIN XL) 150 MG 24 hr tablet Take 1 tablet (150  mg total) by mouth daily. For depression 05/22/20   [name], NP  doxycycline (VIBRA-TABS) 100 MG tablet Take 1 tablet (100 mg total) by mouth every 12 (twelve) hours. For wound infection 05/21/20   [name], NP  hydrOXYzine (ATARAX/VISTARIL) 25 MG tablet Take 1 tablet (25 mg total) by mouth 3 (three) times daily as needed for anxiety. 05/21/20   [name], NP  mupirocin cream (BACTROBAN) 2 % Apply topically 2 (two) times daily. For wound care 05/21/20   [name], NP  neomycin-bacitracin-polymyxin (NEOSPORIN) OINT Apply 1 application topically 3 (three) times daily. For wound care 05/21/20   [name], NP  nicotine (NICODERM CQ - DOSED IN MG/24 HOURS) 21 mg/24hr patch Place 1 patch (21 mg total) onto the skin daily. (May buy from over the counter): For smoking cessation 05/22/20   [name], NP  traZODone (DESYREL) 50 MG tablet Take 1 tablet (50 mg total) by mouth at bedtime as needed for sleep. 05/21/20   [name], NP    Allergies     Patient has no known allergies.  Review of Systems   Review of Systems  Constitutional: Negative for fever.  Musculoskeletal: Negative for arthralgias and myalgias.  Skin: Positive for wound.  Allergic/Immunologic: Negative for immunocompromised state.  Neurological: Negative for weakness and numbness.    Physical Exam Updated Vital Signs BP 107/73   Pulse 94   Temp 97.6 F (36.4 C) (Oral)   Resp 19   Ht 5\' 6"  (1.676 m)   Wt 70.3 kg   SpO2 100%   BMI 25.02 kg/m   Physical Exam Vitals and nursing note reviewed.  Constitutional:      General: She is not in acute distress.    Appearance: She is well-developed and well-nourished. She is not diaphoretic.  HENT:     Head: Normocephalic and atraumatic.  Pulmonary:     Effort: Pulmonary effort is normal.  Musculoskeletal:        General: Swelling and tenderness present.     Comments: Wound to right index finger from recent I&D with local swelling, no active drainage, normal range of motion, sensation intact.  Skin:    General: Skin is warm and dry.     Capillary Refill: Capillary refill takes less than 2 seconds.     Findings: No erythema.  Neurological:     Mental Status: She is alert and oriented to person, place, and time.     Sensory: No sensory deficit.  Psychiatric:        Mood and Affect: Mood and affect normal.        Behavior: Behavior normal.     ED Results / Procedures / Treatments   Labs (all labs ordered are listed, but only abnormal results are displayed) Labs Reviewed - No data to display  EKG None  Radiology No results found.  Procedures Procedures   Medications Ordered in ED Medications - No data to display  ED Course  I have reviewed the triage vital signs and the nursing notes.  Pertinent labs & imaging results that were available during my care of the patient were reviewed by me and considered in my medical decision making (see chart for details).  Clinical Course as of 05/21/20  1408  Thu May 21, 2020  2334 30 year old female with right index finger wound as above, appears to be healing well.  Advised to continue taking the antibiotics as prescribed today, recommend warm soaks being  sure to dry the area off well after soaks and apply bacitracin ointment. [LM]    Clinical Course User Index [LM] Alden Hipp   MDM Rules/Calculators/A&P                          Final Clinical Impression(s) / ED Diagnoses Final diagnoses:  Paronychia of finger, right    Rx / DC Orders ED Discharge Orders    None       Alden Hipp 05/21/20 1408    Lorre Nick, MD 05/22/20 [phone]

## 2020-05-21 NOTE — ED Triage Notes (Signed)
Patient reports that she was tested and Covid + 3 days ago.  Patient was discharged from [facility] today. Patient states her right pointer finger is infected at the nail bed. Patient states she has been taking antibiotics, but is still infected. Patient was sent from [facility].

## 2020-05-27 ENCOUNTER — Ambulatory Visit (HOSPITAL_COMMUNITY): Payer: Federal, State, Local not specified - Other | Admitting: Behavioral Health

## 2020-12-02 ENCOUNTER — Emergency Department (HOSPITAL_COMMUNITY)
Admission: EM | Admit: 2020-12-02 | Discharge: 2020-12-03 | Disposition: A | Discharge status: Other Acute Inpatient Hospital | Payer: Medicaid Other | Attending: Emergency Medicine | Admitting: Emergency Medicine

## 2020-12-02 ENCOUNTER — Other Ambulatory Visit: Payer: Self-pay

## 2020-12-02 ENCOUNTER — Ambulatory Visit (HOSPITAL_COMMUNITY)
Admission: RE | Admit: 2020-12-02 | Discharge: 2020-12-02 | Disposition: A | Discharge status: Home / Self Care | Payer: Federal, State, Local not specified - Other | Attending: Psychiatry | Admitting: Psychiatry

## 2020-12-02 ENCOUNTER — Encounter (HOSPITAL_COMMUNITY): Payer: Self-pay | Admitting: Emergency Medicine

## 2020-12-02 DIAGNOSIS — F1410 Cocaine abuse, uncomplicated: Secondary | ICD-10-CM | POA: Insufficient documentation

## 2020-12-02 DIAGNOSIS — F17210 Nicotine dependence, cigarettes, uncomplicated: Secondary | ICD-10-CM | POA: Insufficient documentation

## 2020-12-02 DIAGNOSIS — F332 Major depressive disorder, recurrent severe without psychotic features: Secondary | ICD-10-CM | POA: Insufficient documentation

## 2020-12-02 DIAGNOSIS — N9489 Other specified conditions associated with female genital organs and menstrual cycle: Secondary | ICD-10-CM | POA: Insufficient documentation

## 2020-12-02 DIAGNOSIS — R7401 Elevation of levels of liver transaminase levels: Secondary | ICD-10-CM

## 2020-12-02 DIAGNOSIS — Y900 Blood alcohol level of less than 20 mg/100 ml: Secondary | ICD-10-CM | POA: Insufficient documentation

## 2020-12-02 DIAGNOSIS — R42 Dizziness and giddiness: Secondary | ICD-10-CM | POA: Insufficient documentation

## 2020-12-02 DIAGNOSIS — R45851 Suicidal ideations: Secondary | ICD-10-CM | POA: Insufficient documentation

## 2020-12-02 DIAGNOSIS — Z9151 Personal history of suicidal behavior: Secondary | ICD-10-CM | POA: Insufficient documentation

## 2020-12-02 DIAGNOSIS — F1120 Opioid dependence, uncomplicated: Secondary | ICD-10-CM | POA: Insufficient documentation

## 2020-12-02 DIAGNOSIS — Z8616 Personal history of COVID-19: Secondary | ICD-10-CM | POA: Insufficient documentation

## 2020-12-02 DIAGNOSIS — R112 Nausea with vomiting, unspecified: Secondary | ICD-10-CM

## 2020-12-02 DIAGNOSIS — Z20822 Contact with and (suspected) exposure to covid-19: Secondary | ICD-10-CM | POA: Insufficient documentation

## 2020-12-02 DIAGNOSIS — F419 Anxiety disorder, unspecified: Secondary | ICD-10-CM | POA: Insufficient documentation

## 2020-12-02 LAB — COMPREHENSIVE METABOLIC PANEL
ALT: 335 U/L — ABNORMAL HIGH (ref 0–44)
AST: 224 U/L — ABNORMAL HIGH (ref 15–41)
Albumin: 4.3 g/dL (ref 3.5–5.0)
Alkaline Phosphatase: 68 U/L (ref 38–126)
Anion gap: 7 (ref 5–15)
BUN: 11 mg/dL (ref 6–20)
CO2: 31 mmol/L (ref 22–32)
Calcium: 10 mg/dL (ref 8.9–10.3)
Chloride: 100 mmol/L (ref 98–111)
Creatinine, Ser: 0.62 mg/dL (ref 0.44–1.00)
GFR, Estimated: >60 mL/min (ref >60)
Glucose, Bld: 97 mg/dL (ref 70–99)
Potassium: 3.8 mmol/L (ref 3.5–5.1)
Sodium: 138 mmol/L (ref 135–145)
Total Bilirubin: 0.6 mg/dL (ref 0.3–1.2)
Total Protein: 7.8 g/dL (ref 6.5–8.1)

## 2020-12-02 LAB — CBC WITH DIFFERENTIAL/PLATELET
Abs Immature Granulocytes: 0.02 10*3/uL (ref 0.00–0.07)
Basophils Absolute: 0 10*3/uL (ref 0.0–0.1)
Basophils Relative: 1 %
Eosinophils Absolute: 0.2 10*3/uL (ref 0.0–0.5)
Eosinophils Relative: 3 %
HCT: 47.5 % — ABNORMAL HIGH (ref 36.0–46.0)
Hemoglobin: 15 g/dL (ref 12.0–15.0)
Immature Granulocytes: 0 %
Lymphocytes Relative: 25 %
Lymphs Abs: 1.3 10*3/uL (ref 0.7–4.0)
MCH: 28.6 pg (ref 26.0–34.0)
MCHC: 31.6 g/dL (ref 30.0–36.0)
MCV: 90.6 fL (ref 80.0–100.0)
Monocytes Absolute: 0.5 10*3/uL (ref 0.1–1.0)
Monocytes Relative: 10 %
Neutro Abs: 3.2 10*3/uL (ref 1.7–7.7)
Neutrophils Relative %: 61 %
Platelets: 256 10*3/uL (ref 150–400)
RBC: 5.24 MIL/uL — ABNORMAL HIGH (ref 3.87–5.11)
RDW: 13.2 % (ref 11.5–15.5)
WBC: 5.2 10*3/uL (ref 4.0–10.5)
nRBC: 0 % (ref 0.0–0.2)

## 2020-12-02 LAB — RAPID URINE DRUG SCREEN, HOSP PERFORMED
Amphetamines: NOT DETECTED
Barbiturates: NOT DETECTED
Benzodiazepines: NOT DETECTED
Cocaine: NOT DETECTED
Opiates: POSITIVE — AB
Tetrahydrocannabinol: NOT DETECTED

## 2020-12-02 LAB — I-STAT BETA HCG BLOOD, ED (MC, WL, AP ONLY): I-stat hCG, quantitative: <5 m[IU]/mL (ref <5)

## 2020-12-02 LAB — ETHANOL: Alcohol, Ethyl (B): <10 mg/dL (ref <10)

## 2020-12-02 LAB — SALICYLATE LEVEL: Salicylate Lvl: <7 mg/dL — ABNORMAL LOW (ref 7.0–30.0)

## 2020-12-02 LAB — ACETAMINOPHEN LEVEL: Acetaminophen (Tylenol), Serum: <10 ug/mL — ABNORMAL LOW (ref 10–30)

## 2020-12-02 MED ORDER — ONDANSETRON HCL 4 MG/2ML IJ SOLN
4.0000 mg | Freq: Once | INTRAMUSCULAR | Status: AC
  Administered 2020-12-02: 4 mg via INTRAVENOUS
  Filled 2020-12-02: qty 2

## 2020-12-02 MED ORDER — SODIUM CHLORIDE 0.9 % IV BOLUS
1000.0000 mL | Freq: Once | INTRAVENOUS | Status: AC
  Administered 2020-12-02: 1000 mL via INTRAVENOUS
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
## 2020-12-02 NOTE — ED Provider Notes (Signed)
[facility]-EMERGENCY DEPT Provider Note   CSN: [id number] Arrival date & time: 12/02/20  2150     History Chief Complaint  Patient presents with   Headache   Nausea   Dizziness    Angela Moran is a 30 y.o. female with a past medical history significant for seizures and depression who presents to the ED due to suicidal ideations.  Patient states she has a plan to overdose.  She admits to using heroin laced with fentanyl.  Denies HI and auditory/visual hallucinations. Chart reviewed.  Patient a recent admission in January 2022 for suicidal ideations.  Patient also endorses nausea and vomiting x2 days.  Admits to numerous episodes of nonbloody, nonbilious emesis. She does note some of her emesis was brown in color. Denies associated abdominal pain, chest pain, shortness of breath.  She admits to a previous C-section.  No urinary or vaginal symptoms.  No fever or chills.  History obtained from patient and past medical records. No interpreter used during encounter.      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) 02/23/2018   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   Smoker 12/18/2017   History of illicit drug use 07/17/2017   Depression with anxiety 12/04/2013    Past Surgical History:  Procedure Laterality Date   CESAREAN SECTION N/A 03/07/2018   Procedure: REPEAT CESAREAN  SECTION;  Surgeon: [name], Jacob J, DO;  Location: [facility];  Service: Obstetrics;  Laterality: N/A;   NO PAST SURGERIES       OB History     Gravida  1   Para  1   Term  1   Preterm  0   AB  0   Living  1      SAB  0   IAB  0   Ectopic  0   Multiple  0   Live Births  1           Family History  Problem Relation Age of Onset   Other Mother        Bone deteriation   Alcohol abuse Mother    Cirrhosis Father    Alcohol abuse Father    Diabetes Brother    Alcohol abuse Maternal Grandmother    Alcohol abuse Maternal Grandfather    Alcohol abuse Paternal Grandmother    Alcohol abuse Paternal Grandfather    Autism Brother    ADD / ADHD Brother     Social History   Tobacco Use   Smoking status: Every Day    Packs/day: 0.50    Years: 6.00    Pack years: 3.00    Types: Cigarettes   Smokeless tobacco: Never  Vaping Use   Vaping Use: Former  Substance Use Topics   Alcohol use: Yes    Comment:  4 beers daily, 2-4 shots daily   Drug use: Not Currently    Types: Marijuana, Cocaine, Heroin    Comment: last used last week    Home Medications Prior to Admission medications   Medication Sig Start Date End Date Taking? Authorizing Provider  buPROPion (WELLBUTRIN XL) 150 MG 24 hr tablet Take 1 tablet (150 mg total) by mouth daily. For depression Patient not taking: Reported on 12/02/2020 05/22/20   [name], NP  doxycycline (VIBRA-TABS) 100 MG tablet Take 1 tablet (100 mg total) by mouth every 12 (twelve) hours. For wound infection Patient not taking: Reported on 12/02/2020 05/21/20   [name], NP  hydrOXYzine (ATARAX/VISTARIL) 25 MG tablet Take 1 tablet (25 mg total) by mouth 3 (three) times daily as needed for anxiety. Patient not taking: Reported on 12/02/2020 05/21/20   [name], NP  mupirocin cream (BACTROBAN) 2 % Apply topically 2 (two) times daily. For wound care Patient not taking: Reported on 12/02/2020 05/21/20   [name],  NP  neomycin-bacitracin-polymyxin (NEOSPORIN) OINT Apply 1 application topically 3 (three) times daily. For wound care Patient not taking: Reported on 12/02/2020 05/21/20   [name], NP  nicotine (NICODERM CQ - DOSED IN MG/24 HOURS) 21 mg/24hr patch Place 1 patch (21 mg total) onto the skin daily. (May buy from over the counter): For smoking cessation Patient not taking: Reported on 12/02/2020 05/22/20   [name], NP  traZODone (DESYREL) 50 MG tablet Take 1 tablet (50 mg total) by mouth at bedtime as needed for sleep. Patient not taking: Reported on 12/02/2020 05/21/20   [name], NP    Allergies    Patient has no known allergies.  Review of Systems   Review of Systems  Constitutional:  Negative for chills and fever.  Gastrointestinal:  Positive for nausea and vomiting. Negative for abdominal pain and diarrhea.  Genitourinary:  Negative for dysuria and vaginal discharge.  All other systems reviewed and are negative.  Physical Exam Updated Vital Signs BP (!) 95/54   Pulse (!) 54   Temp 97.9 F (36.6 C) (Oral)   Resp 20   Ht 5\' 6"  (1.676 m)   Wt 74.8 kg   SpO2 100%   BMI 26.63 kg/m   Physical Exam Vitals and nursing note reviewed.  Constitutional:      General: She is not in acute distress.    Appearance: She is not ill-appearing.  HENT:     Head: Normocephalic.  Eyes:     Pupils: Pupils are equal, round, and reactive to light.  Cardiovascular:     Rate and Rhythm: Normal rate and regular rhythm.     Pulses: Normal pulses.     Heart sounds: Normal heart sounds. No murmur heard.   No friction rub. No gallop.  Pulmonary:     Effort: Pulmonary effort is normal.     Breath sounds: Normal breath sounds.  Abdominal:     General: Abdomen is flat. There is no distension.     Palpations: Abdomen is soft.     Tenderness: There is no abdominal tenderness. There is no guarding or rebound.     Comments: Abdomen soft, nondistended, nontender to palpation in all  quadrants without guarding or peritoneal signs. No rebound.   Musculoskeletal:        General: Normal range of motion.     Cervical back: Neck supple.  Skin:    General: Skin is warm and dry.  Neurological:  General: No focal deficit present.     Mental Status: She is alert.  Psychiatric:        Mood and Affect: Mood normal.        Behavior: Behavior normal.    ED Results / Procedures / Treatments   Labs (all labs ordered are listed, but only abnormal results are displayed) Labs Reviewed  COMPREHENSIVE METABOLIC PANEL - Abnormal; Notable for the following components:      Result Value   AST 224 (*)    ALT 335 (*)    All other components within normal limits  RAPID URINE DRUG SCREEN, HOSP PERFORMED - Abnormal; Notable for the following components:   Opiates POSITIVE (*)    All other components within normal limits  CBC WITH DIFFERENTIAL/PLATELET - Abnormal; Notable for the following components:   RBC 5.24 (*)    HCT 47.5 (*)    All other components within normal limits  ACETAMINOPHEN LEVEL - Abnormal; Notable for the following components:   Acetaminophen (Tylenol), Serum <10 (*)    All other components within normal limits  SALICYLATE LEVEL - Abnormal; Notable for the following components:   Salicylate Lvl <7.0 (*)    All other components within normal limits  POC OCCULT BLOOD, ED - Abnormal; Notable for the following components:   Fecal Occult Bld POSITIVE (*)    All other components within normal limits  RESP PANEL BY RT-PCR (FLU A&B, COVID) ARPGX2  ETHANOL  HEPATITIS PANEL, ACUTE  PROTIME-INR  APTT  I-STAT BETA HCG BLOOD, ED ([facility], [facility], [facility] ONLY)    EKG None  Radiology No results found.  Procedures Procedures   Medications Ordered in ED Medications  sodium chloride 0.9 % bolus 1,000 mL (has no administration in time range)  sodium chloride 0.9 % bolus 1,000 mL (1,000 mLs Intravenous New Bag/Given 12/02/20 2253)  ondansetron (ZOFRAN) injection 4 mg (4 mg  Intravenous Given 12/02/20 2252)    ED Course  I have reviewed the triage vital signs and the nursing notes.  Pertinent labs & imaging results that were available during my care of the patient were reviewed by me and considered in my medical decision making (see chart for details).  Clinical Course as of 12/03/20 0006  Wed Dec 02, 2020  2253 Opiates(!): POSITIVE [CA]  2337 AST(!): 224 [CA]  2337 ALT(!): 335 [CA]    Clinical Course User Index [CA] Jesusita Oka   MDM Rules/Calculators/A&P                          30 year old female presents to the ED from [facility] for medical clearance. Per [facility], concern for possible GI bleed given brown emesis.  Patient denies hematemesis, hematochezia, melena.  Patient states she presented to [facility] due to suicidal ideations with plan to overdose.  Patient endorses 2 days of nausea and numerous episodes of nonbloody, nonbilious emesis.  No abdominal pain, fever, chills, urinary symptoms, vaginal symptoms.  Arrival, stable vitals.  Patient in no acute distress.  Benign physical exam.  Abdomen soft, nondistended, nontender.  Medical clearance labs ordered.  IV fluids and Zofran given.  CBC reassuring with no leukocytosis and normal hemoglobin.  CMP significant for elevated AST at 224 and ALT at 335.  No right upper quadrant tenderness.  Patient denies chronic Tylenol.  Occasional alcohol use.  Could be related to viral etiology.  Hepatitis panel ordered.  Acetaminophen, salicylate, and ethanol level within normal limits.  Pregnancy test  negative. Fecal occult positive.  Patient handed off to [facility], PA-C at shift change pending medical clearance. Once cleared, patient has a bed at [facility].  Final Clinical Impression(s) / ED Diagnoses Final diagnoses:  Suicidal ideation  Non-intractable vomiting with nausea, unspecified vomiting type    Rx / DC Orders ED Discharge Orders     None        Jesusita Oka 12/03/20 0007    Ernie Avena, MD 12/03/20 1552

## 2020-12-02 NOTE — H&P (Addendum)
Behavioral Health Medical Screening Exam  Visit Diagnoses:   -MDD (major depressive disorder), recurrent severe, without psychosis (HCC)   -Opioid use disorder, severe, dependence (HCC)  Angela Moran is a 30 y.o. female with documented past psychiatric history of MDD, anxiety, cocaine abuse complicating pregnancy and third trimester, alcohol abuse, stimulant use disorder, and opioid abuse, as well as past medical history of HSV-2 infection and cesarean section, who presents to [facility] as a voluntary walk-in accompanied by her sister-in-law [name]: [phone]) for worsening depression, worsening anxiety, SI, and substance abuse issues.  With patient's consent, patient's sister-in-law present during the evaluation and information was obtained from both the patient and patient's sister-in-law with patient's verbal consent.  Patient states that she came to the behavioral health Hospital because of "suicidal thoughts. I've been really depressed.  I need a lot of help". Patient endorses passive SI with persistent thoughts of not wanting to be alive without associated plan or intent currently, but does state that she thinks she would attempt suicide by overdosing on fentanyl if she was to be discharged from [facility] or did not receive help/assistance for her depression and anxiety.  Patient denies history of any past suicide attempts.  Patient does endorse history of self-injurious behavior via cutting, but patient states that she has not cut herself intentionally since the age of [age] years old.  Patient denies any history of intentionally burning herself.  She denies HI, AVH, paranoia, or delusions.  Patient states that her sleep is generally fair but has been worse recently due to her current status of being homeless.  Patient endorses hypersomnia, stating that she has been sleeping more than usual due to the past few days due to her depression.  She also reports that earlier  this week, she went a few days without sleep but she attributes this to being homeless and states that this was not attributed to the feeling of lack of need for sleep.  She endorses anhedonia and feelings of guilt, hopelessness, and worthlessness over the past few months.  She reports chronic fatigue as well as chronic poor concentration/focus.  She denies appetite changes but does endorse fluctuating weight loss and weight gain over the past few months.  Patient unable to recall how much weight she has lost or gained over the past few months.  She endorses frequent tearful spells. She endorses occasionally being easily distracted, but denies excessive spending/shopping sprees, thoughts of grandiosity, flight of ideas, or any additional manic symptoms.   Per chart review, patient has been admitted to [facility] on 1 occasion in the past which was from 05/17/2020 to 05/21/2020 for MDD.  Upon patient's discharge from the behavioral health Hospital on 05/21/2020, patient was discharged with prescriptions for Wellbutrin XL 150 mg p.o. daily for MDD, Vistaril 25 mg p.o. 3 times daily as needed for anxiety, and trazodone 50 mg p.o. at bedtime as needed for sleep.  Upon 05/21/2020 behavior health hospital discharge, patient also had an appointment scheduled on 05/27/2020 at 1:00 PM at the [facility] to start substance abuse intensive outpatient therapy as well as a walk-in appointment at the [facility] for psychotropic medication management on 06/17/2020 at 7:45 AM.  Patient states that she took the Wellbutrin, Vistaril, and trazodone prescriptions noted above that she was given upon behavioral health hospital discharge on 05/21/2020 as prescribed, but patient states that when she ran out of those prescriptions, she was never  able to receive any additional prescriptions and she states that when she ran out of these Wellbutrin, Vistaril, and  trazodone prescriptions in February 2022. Patient reports that she has not taken any additional psychotropic medications since she ran out of her Wellbutrin, Vistaril, and trazodone prescriptions noted above in February 2022.  Patient states that she is not taking any home medications at this time.  Patient also reports that she was never able to make it to the 06/17/20 walk-in psychotropic medication management appointment or the 05/27/20 substance abuse intensive outpatient therapy appointment noted above and she states that she has not seen an outpatient psychiatrist or therapist since she was discharged from the behavioral health Hospital on 05/21/2020. She reports that she has attempted to contact [facility] twice since January 2022 to inquire about obtaining psychiatry appointments through [place], but states that [facility] told her on both occasions that they were not accepting new patients at those times. Patient does not have an outpatient psychiatrist or therapist at this time.  Patient reports that she has been using about $20 worth of heroin/fentanyl (Patient states that she thinks what she uses is mostly fentanyl but she states that she thinks sometimes she uses heroin as well) daily via IV route (patient states that her injection sites are usually her arms and the sides of her neck) for the past 1.5 years.  Patient states that she started using heroin and fentanyl at the age of 29 years old.  Patient states that her last fentanyl use was about $20 worth via IV route earlier today on 12/02/2020 at around 2:00 PM.  Patient endorses history of the following withdrawal symptoms from opioids/heroin/fentanyl: Nausea, headaches, body aches/myalgias, abdominal cramping, decreased appetite, increased anxiety, and diaphoresis.  Patient denies any history of rhinorrhea, lacrimation, vomiting, or piloerection associated with her opioid withdrawal.  Patient states that she usually does not begin to experience withdrawal  symptoms from opioids/heroin/fentanyl until about 2 days/48 hours after her last use of heroin/fentanyl. She denies history of seizures.  Patient endorses nausea, headache, dizziness, and lightheadedness currently on exam.  Patient states that she has been vomiting intermittently for the past 2 days.  Patient states that she has vomited 4 times over the past 2 days.  Patient states that her last episode of emesis was on the way over to the behavioral health Hospital around 5:30 PM/5:45 PM today on 12/02/2020.  Patient describes her vomit as brown in color over the past 2 days and she states that she does not believe that she has seen any evidence of blood in her vomit.  Patient denies fever, chills, SOB, chest pain, LOC, or any additional physical symptoms on exam at this time. patient also endorses using Adderall illegally "once in a blue moon".  Patient states that she uses Adderall about 2 times per month and she states that when she uses, she usually uses about 30 mg of Adderall.  Patient states that she does not have an Adderall prescription.  PDMP review shows no history of patient being prescribed Adderall or any stimulants in the past.  PDMP review does show history of patient being prescribed Buprenorphine-Naloxone 8-2 mg film and 4-1 mg film, with most recent prescription being filled on 02/11/2020 for 8-2 mg film.  Patient states that she does not like Suboxone and does not want to take any Suboxone at this time.  Patient reports that she has been drinking alcohol about once per month for the past 6 months and she states that  prior to 6 months ago, patient had issues with alcohol abuse.  Patient endorses smoking 1.5 packs/day of cigarettes for the past 8 years and she endorses vaping nicotine once per month.  Patient denies any additional current substance use at this time, but patient does endorse smoking meth and snorting Xanax 2 months ago. Patient states that she went to rehab in [facility], 1.5  years ago and reports that she was sober from alcohol and other substances for 1 month at that time and patient reports this is her longest past period of sobriety.   Patient reports that in addition to her admission to [facility] from 05/17/2020 to 05/21/2020, she has received inpatient psychiatric treatment on 2 other occasions in the past.  Patient states that she was admitted to [facility] 8 [facility] 9 years ago when she also states that she was admitted at a hospital in [facility] 2 years ago.  Patient states that when she was admitted to the hospital in [place] 2 years ago for psychiatric reasons, CPS became involved because patient states at that time of admission 2 years ago, patient was having thoughts about wanting to harm herself and her son.  Patient states that CPS is still currently involved.  Patient states that her son is 35 years old and lives with the patient's mother in [place], [place]. Patient states that her son will sometimes stay with her sister-in-law on weekends and patient and patient's sister-in-law report that the patient has supervised visitation with patient's son on weekends with patient's sister-in-law present. Patient is currently homeless and has been homeless for 1 week now. Prior to 1 week ago, patient had been living with her sister-in-law for the past 3.5 years. Patient denies access to firearms.   Total Time spent with patient: 30 minutes  Psychiatric Specialty Exam:  Presentation  General Appearance: Disheveled; Appropriate for Environment  Eye Contact:Fair; Fleeting  Speech:Clear and Coherent; Normal Rate  Speech Volume:Normal  Handedness: No data recorded  Mood and Affect  Mood:Depressed  Affect:Congruent; Flat   Thought Process  Thought Processes:Coherent; Goal Directed; Linear  Descriptions of Associations:Intact  Orientation:Full (Time, Place and Person)  Thought Content:Logical; WDL  History of  Schizophrenia/Schizoaffective disorder:No  Duration of Psychotic Symptoms:No data recorded Hallucinations:Hallucinations: None  Ideas of Reference:None  Suicidal Thoughts:Suicidal Thoughts: Yes, Passive (Patient endorses passive SI without associated plan currently, but does state that if she was to be discharged or did not receive help for her depression and anxiety that she thinks she would attempt suicide by overdosing on fentanyl.) SI Passive Intent and/or Plan: Without Intent; Without Plan; With Means to Carry Out; With Access to Means  Homicidal Thoughts:Homicidal Thoughts: No   Sensorium  Memory:Immediate Fair; Recent Fair; Remote Fair  Judgment:Impaired  Insight:Fair   Executive Functions  Concentration:Fair  Attention Span:Fair  Recall:Fair  Fund of Knowledge:Fair  Language:Good   Psychomotor Activity  Psychomotor Activity:Psychomotor Activity: Normal   Assets  Assets:Communication Skills; Desire for Improvement; Leisure Time; Physical Health; Resilience; Social Support   Sleep  Sleep:Sleep: Fair    Physical Exam: Physical Exam Vitals reviewed.  Constitutional:      General: She is not in acute distress.    Appearance: She is not ill-appearing, toxic-appearing or diaphoretic.  HENT:     Head: Normocephalic and atraumatic.     Right Ear: External ear normal.     Left Ear: External ear normal.     Nose: Nose normal.  Eyes:  General:        Right eye: No discharge.        Left eye: No discharge.     Conjunctiva/sclera: Conjunctivae normal.  Cardiovascular:     Rate and Rhythm: Bradycardia present.  Pulmonary:     Effort: Pulmonary effort is normal. No respiratory distress.  Musculoskeletal:        General: Normal range of motion.     Cervical back: Normal range of motion.  Skin:    Comments: Multiple scar-like lesions noted on patient's bilateral arms with no apparent inflammation, drainage, or signs of infection noted. Superficial  lacerations noted on both sides of patient's neck with slight surrounding erythema, but no apparent signs of drainage or infection noted.   Neurological:     General: No focal deficit present.     Mental Status: She is alert and oriented to person, place, and time.     Comments: No tremor noted.   Psychiatric:        Attention and Perception: Attention and perception normal. She does not perceive auditory or visual hallucinations.        Mood and Affect: Mood is depressed.        Speech: Speech normal.        Behavior: Behavior normal. Behavior is not agitated, slowed, aggressive, hyperactive or combative. Behavior is cooperative.        Thought Content: Thought content is not paranoid or delusional. Thought content does not include homicidal ideation.     Comments: Affect flat and mood congruent. Patient endorses passive SI with persistent thoughts of not wanting to be alive without associated plan or intent currently, but does state that she thinks she would attempt suicide by overdosing on fentanyl if she was to be [facility] or did not receive help/assistance for her depression and anxiety.   Review of Systems  Constitutional:  Positive for malaise/fatigue and weight loss. Negative for chills, diaphoresis and fever.  HENT:  Negative for congestion.   Respiratory:  Negative for cough and shortness of breath.   Cardiovascular:  Negative for chest pain and palpitations.  Gastrointestinal:  Positive for nausea and vomiting. Negative for abdominal pain, constipation and diarrhea.  Musculoskeletal:  Positive for back pain and myalgias.  Neurological:  Positive for dizziness and headaches. Negative for tremors, seizures and loss of consciousness.  Psychiatric/Behavioral:  Positive for depression, substance abuse and suicidal ideas. Negative for hallucinations and memory loss. The patient is nervous/anxious and has insomnia.   All other systems reviewed and are negative. Patient endorses  history of the following withdrawal symptoms from opioids/heroin/fentanyl: Nausea, headaches, body aches/myalgias, abdominal cramping, decreased appetite, increased anxiety, and diaphoresis.  Patient denies any history of rhinorrhea, lacrimation, vomiting, or piloerection associated with her opioid withdrawal.  Patient states that she usually does not begin to experience withdrawal symptoms from opioids/heroin/fentanyl until about 2 days/48 hours after her last use of heroin/fentanyl. She denies history of seizures.  Patient endorses nausea, headache, dizziness, and lightheadedness currently on exam.  Patient states that she has been vomiting intermittently for the past 2 days.  Patient states that she has vomited 4 times over the past 2 days.  Patient states that her last episode of emesis was on the way over to the behavioral health Hospital around 5:30 PM/5:45 PM today on 12/02/2020.  Patient describes her vomit as brown in color over the past 2 days and she states that she does not believe that she has seen any evidence of  blood in her vomit.  Patient denies fever, chills, SOB, chest pain, LOC, or any additional physical symptoms on exam at this time.  Vitals: Blood pressure 102/70, pulse (!) 57, temperature (!) 97.5 F (36.4 C), temperature source Oral, resp. rate 16, SpO2 100 %. There is no height or weight on file to calculate BMI.  Musculoskeletal: Strength & Muscle Tone: within normal limits Gait & Station: normal Patient leans: N/A   Recommendations:  Based on my evaluation, the patient appears to have an urgent medical condition for which I recommend the patient be transferred to the emergency department for further evaluation.  Based on patient's current SI and inability to contract for safety outside the hospital, believe that the patient is a threat to herself at this time and is experiencing severe worsening of her depression and anxiety that is severely negatively impacting her ability  to function in her activities of daily living.  Thus, believe that the patient meets inpatient psychiatric treatment criteria at this time and recommend inpatient psychiatric dual-diagnosis treatment for the patient for further crisis stabilization and treatment.   Patient endorses history of the following withdrawal symptoms from opioids/heroin/fentanyl: Nausea, headaches, body aches/myalgias, abdominal cramping, decreased appetite, increased anxiety, and diaphoresis.  Patient denies any history of rhinorrhea, lacrimation, vomiting, or piloerection associated with her opioid withdrawal.  Patient states that she usually does not begin to experience withdrawal symptoms from opioids/heroin/fentanyl until about 2 days/48 hours after her last use of heroin/fentanyl. She denies history of seizures.  Patient endorses nausea, headache, dizziness, and lightheadedness currently on exam.  Patient states that she has been vomiting intermittently for the past 2 days.  Patient states that she has vomited 4 times over the past 2 days.  Patient states that her last episode of emesis was on the way over to the behavioral health Hospital around 5:30 PM/5:45 PM today on 12/02/2020.  Patient describes her vomit as brown in color over the past 2 days and she states that she does not believe that she has seen any evidence of blood in her vomit.  Patient denies fever, chills, SOB, chest pain, LOC, or any additional physical symptoms on exam at this time.   Per [facility], patient is accepted to [facility] for inpatient psychiatric treatment pending medical clearance and negative COVID PCR test result.  Based on patient's current symptoms and reported timeline of usual beginning of withdrawal symptoms noted above, there is some concern regarding patient's current symptoms of nausea, vomiting, headache, lightheadedness, and dizziness.  Thus, will transfer the patient to [facility] emergency department for further medical clearance/evaluation and  for COVID testing/medical clearance labs and patient may return to [facility] from the ED to begin inpatient psychiatric treatment once the patient is medically cleared and has a negative PCR COVID test result.  Patient is agreeable to this plan.  Provider report given to Dr. Effie Shy via phone and Dr. Effie Shy has agreed to accept the patient.  Update: Spoke with Mia McDonald, PA-C [facility]) via phone regarding patient's current status in the ED. Per PA McDonald, patient is medically cleared and PRN Zofran is recommended for patient's N/V while patient is at [facility]. Please see Mia McDonald, PA-C note for further details regarding patient's medical clearance if necessary. Patient to be transferred back to [facility] from the ED to begin inpatient psychiatric dual-diagnosis treatment. Admission Orders placed.   Jaclyn Shaggy, PA-C 12/02/2020, 9:37 PM

## 2020-12-02 NOTE — ED Notes (Signed)
UTO blood work after x2 attempt on [facility].

## 2020-12-02 NOTE — ED Triage Notes (Signed)
Patient states she came from [facility]. She was sent do to nausea, headaches and dizziness. Symptoms started day before yesterday. She thinks this may have been caused by the drugs she started using.

## 2020-12-03 ENCOUNTER — Inpatient Hospital Stay (HOSPITAL_COMMUNITY)
Admission: AD | Admit: 2020-12-03 | Discharge: 2020-12-10 | DRG: 885 | Disposition: A | Discharge status: Home / Self Care | Payer: Federal, State, Local not specified - Other | Source: Intra-hospital | Attending: Psychiatry | Admitting: Psychiatry

## 2020-12-03 ENCOUNTER — Encounter (HOSPITAL_COMMUNITY): Payer: Self-pay | Admitting: Psychiatry

## 2020-12-03 DIAGNOSIS — F1010 Alcohol abuse, uncomplicated: Secondary | ICD-10-CM | POA: Diagnosis present

## 2020-12-03 DIAGNOSIS — F1110 Opioid abuse, uncomplicated: Secondary | ICD-10-CM | POA: Diagnosis present

## 2020-12-03 DIAGNOSIS — F419 Anxiety disorder, unspecified: Secondary | ICD-10-CM | POA: Diagnosis present

## 2020-12-03 DIAGNOSIS — Z9152 Personal history of nonsuicidal self-harm: Secondary | ICD-10-CM

## 2020-12-03 DIAGNOSIS — R45851 Suicidal ideations: Secondary | ICD-10-CM | POA: Diagnosis present

## 2020-12-03 DIAGNOSIS — Z5900 Homelessness unspecified: Secondary | ICD-10-CM | POA: Diagnosis not present

## 2020-12-03 DIAGNOSIS — F332 Major depressive disorder, recurrent severe without psychotic features: Secondary | ICD-10-CM | POA: Diagnosis present

## 2020-12-03 DIAGNOSIS — F1510 Other stimulant abuse, uncomplicated: Secondary | ICD-10-CM | POA: Diagnosis present

## 2020-12-03 DIAGNOSIS — F1590 Other stimulant use, unspecified, uncomplicated: Secondary | ICD-10-CM | POA: Diagnosis present

## 2020-12-03 DIAGNOSIS — Z20822 Contact with and (suspected) exposure to covid-19: Secondary | ICD-10-CM | POA: Diagnosis present

## 2020-12-03 DIAGNOSIS — Z818 Family history of other mental and behavioral disorders: Secondary | ICD-10-CM

## 2020-12-03 DIAGNOSIS — F17210 Nicotine dependence, cigarettes, uncomplicated: Secondary | ICD-10-CM | POA: Diagnosis present

## 2020-12-03 LAB — PROTIME-INR
INR: 1 (ref 0.8–1.2)
Prothrombin Time: 13.2 seconds (ref 11.4–15.2)

## 2020-12-03 LAB — LIPID PANEL
Cholesterol: 122 mg/dL (ref 0–200)
HDL: 36 mg/dL — ABNORMAL LOW (ref >40)
LDL Cholesterol: 64 mg/dL (ref 0–99)
Total CHOL/HDL Ratio: 3.4 RATIO
Triglycerides: 110 mg/dL (ref <150)
VLDL: 22 mg/dL (ref 0–40)

## 2020-12-03 LAB — LIPASE, BLOOD: Lipase: 26 U/L (ref 11–51)

## 2020-12-03 LAB — RESP PANEL BY RT-PCR (FLU A&B, COVID) ARPGX2
Influenza A by PCR: NEGATIVE
Influenza B by PCR: NEGATIVE
SARS Coronavirus 2 by RT PCR: NEGATIVE

## 2020-12-03 LAB — APTT: aPTT: 34 seconds (ref 24–36)

## 2020-12-03 LAB — POC OCCULT BLOOD, ED: Fecal Occult Bld: POSITIVE — AB

## 2020-12-03 LAB — HEPATITIS PANEL, ACUTE
HCV Ab: REACTIVE — AB
Hep A IgM: NONREACTIVE
Hep B C IgM: NONREACTIVE
Hepatitis B Surface Ag: NONREACTIVE

## 2020-12-03 LAB — TSH: TSH: 0.774 u[IU]/mL (ref 0.350–4.500)

## 2020-12-03 MED ORDER — LORAZEPAM 1 MG PO TABS: 1.0000 mg | ORAL_TABLET | Freq: Four times a day (QID) | ORAL | Status: DC | PRN

## 2020-12-03 MED ORDER — SODIUM CHLORIDE 0.9 % IV BOLUS
1000.0000 mL | Freq: Once | INTRAVENOUS | Status: AC
Start: 2020-12-03 — End: 2020-12-03
  Administered 2020-12-03: 1000 mL via INTRAVENOUS

## 2020-12-03 MED ORDER — ADULT MULTIVITAMIN W/MINERALS CH
1.0000 | ORAL_TABLET | Freq: Every day | ORAL | Status: DC
  Administered 2020-12-04 – 2020-12-10 (×7): 1 via ORAL
  Filled 2020-12-03 (×9): qty 1

## 2020-12-03 MED ORDER — VITAMIN B-1 50 MG PO TABS
50.0000 mg | ORAL_TABLET | Freq: Every day | ORAL | Status: DC
  Filled 2020-12-03 (×2): qty 1

## 2020-12-03 MED ORDER — NAPROXEN 500 MG PO TABS
500.0000 mg | ORAL_TABLET | Freq: Two times a day (BID) | ORAL | Status: AC | PRN
  Administered 2020-12-03 – 2020-12-06 (×2): 500 mg via ORAL
  Filled 2020-12-03 (×2): qty 1

## 2020-12-03 MED ORDER — LOPERAMIDE HCL 2 MG PO CAPS: 2.0000 mg | ORAL_CAPSULE | ORAL | Status: AC | PRN

## 2020-12-03 MED ORDER — HYDROXYZINE HCL 25 MG PO TABS
25.0000 mg | ORAL_TABLET | Freq: Three times a day (TID) | ORAL | Status: DC | PRN
  Administered 2020-12-03 – 2020-12-05 (×5): 25 mg via ORAL
  Filled 2020-12-03 (×5): qty 1

## 2020-12-03 MED ORDER — ACETAMINOPHEN 325 MG PO TABS
650.0000 mg | ORAL_TABLET | Freq: Four times a day (QID) | ORAL | Status: DC | PRN
  Administered 2020-12-04 – 2020-12-05 (×2): 650 mg via ORAL
  Filled 2020-12-03 (×2): qty 2

## 2020-12-03 MED ORDER — METHOCARBAMOL 500 MG PO TABS
500.0000 mg | ORAL_TABLET | Freq: Three times a day (TID) | ORAL | Status: AC | PRN
  Administered 2020-12-03 – 2020-12-04 (×2): 500 mg via ORAL
  Filled 2020-12-03 (×2): qty 1

## 2020-12-03 MED ORDER — BUPROPION HCL ER (XL) 150 MG PO TB24
150.0000 mg | ORAL_TABLET | Freq: Every day | ORAL | Status: DC
  Filled 2020-12-03: qty 1

## 2020-12-03 MED ORDER — TRAZODONE HCL 50 MG PO TABS
50.0000 mg | ORAL_TABLET | Freq: Every evening | ORAL | Status: DC | PRN
  Administered 2020-12-03 – 2020-12-07 (×5): 50 mg via ORAL
  Filled 2020-12-03 (×5): qty 1

## 2020-12-03 MED ORDER — PANTOPRAZOLE SODIUM 40 MG PO TBEC
40.0000 mg | DELAYED_RELEASE_TABLET | Freq: Every day | ORAL | Status: DC
  Administered 2020-12-03 – 2020-12-09 (×7): 40 mg via ORAL
  Filled 2020-12-03 (×10): qty 1

## 2020-12-03 MED ORDER — ONDANSETRON 4 MG PO TBDP
4.0000 mg | ORAL_TABLET | Freq: Four times a day (QID) | ORAL | Status: AC | PRN
  Administered 2020-12-03 – 2020-12-05 (×2): 4 mg via ORAL
  Filled 2020-12-03 (×4): qty 1

## 2020-12-03 MED ORDER — DICYCLOMINE HCL 20 MG PO TABS
20.0000 mg | ORAL_TABLET | Freq: Four times a day (QID) | ORAL | Status: AC | PRN
  Administered 2020-12-03: 20 mg via ORAL
  Filled 2020-12-03: qty 1

## 2020-12-03 MED ORDER — ALUM & MAG HYDROXIDE-SIMETH 200-200-20 MG/5ML PO SUSP: 30.0000 mL | ORAL | Status: DC | PRN

## 2020-12-03 MED ORDER — NICOTINE 14 MG/24HR TD PT24
14.0000 mg | MEDICATED_PATCH | Freq: Every day | TRANSDERMAL | Status: DC
  Administered 2020-12-06 – 2020-12-08 (×2): 14 mg via TRANSDERMAL
  Filled 2020-12-03 (×2): qty 1
  Filled 2020-12-03: qty 7
  Filled 2020-12-03 (×7): qty 1

## 2020-12-03 MED ORDER — MAGNESIUM HYDROXIDE 400 MG/5ML PO SUSP
30.0000 mL | Freq: Every day | ORAL | Status: DC | PRN
  Administered 2020-12-07: 30 mL via ORAL
  Filled 2020-12-03: qty 30
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
## 2020-12-03 NOTE — Tx Team (Signed)
Interdisciplinary Treatment and Diagnostic Plan Update  12/03/2020 Time of Session: 9:05am [name] MRN: [id number]  Principal Diagnosis: <principal problem not specified>  Secondary Diagnoses: Active Problems:   MDD (major depressive disorder), recurrent severe, without psychosis ([place])   Current Medications:  Current Facility-Administered Medications  Medication Dose Route Frequency Provider Last Rate Last Admin   acetaminophen (TYLENOL) tablet 650 mg  650 mg Oral Q6H PRN Prescilla Sours, PA-C       alum & mag hydroxide-simeth (MAALOX/MYLANTA) 200-200-20 MG/5ML suspension 30 mL  30 mL Oral Q4H PRN Margorie John W, PA-C       dicyclomine (BENTYL) tablet 20 mg  20 mg Oral Q6H PRN Margorie John W, PA-C   20 mg at 12/03/20 1042   hydrOXYzine (ATARAX/VISTARIL) tablet 25 mg  25 mg Oral TID PRN Prescilla Sours, PA-C   25 mg at 12/03/20 1043   loperamide (IMODIUM) capsule 2-4 mg  2-4 mg Oral PRN Margorie John W, PA-C       magnesium hydroxide (MILK OF MAGNESIA) suspension 30 mL  30 mL Oral Daily PRN Margorie John W, PA-C       methocarbamol (ROBAXIN) tablet 500 mg  500 mg Oral Q8H PRN Margorie John W, PA-C   500 mg at 12/03/20 1043   naproxen (NAPROSYN) tablet 500 mg  500 mg Oral BID PRN Prescilla Sours, PA-C   500 mg at 12/03/20 1043   nicotine (NICODERM CQ - dosed in mg/24 hours) patch 14 mg  14 mg Transdermal Daily Lovena Le, Cody W, PA-C       ondansetron (ZOFRAN-ODT) disintegrating tablet 4 mg  4 mg Oral Q6H PRN Prescilla Sours, PA-C   4 mg at 12/03/20 1043   pantoprazole (PROTONIX) EC tablet 40 mg  40 mg Oral Daily Sharma Covert, MD       traZODone (DESYREL) tablet 50 mg  50 mg Oral QHS PRN Prescilla Sours, PA-C       PTA Medications: Medications Prior to Admission  Medication Sig Dispense Refill Last Dose   ibuprofen (ADVIL) 400 MG tablet Take 400 mg by mouth every 6 (six) hours as needed for mild pain or headache.      buPROPion (WELLBUTRIN XL) 150 MG 24 hr tablet Take 1 tablet (150  mg total) by mouth daily. For depression (Patient not taking: No sig reported) 30 tablet 0 Not Taking   doxycycline (VIBRA-TABS) 100 MG tablet Take 1 tablet (100 mg total) by mouth every 12 (twelve) hours. For wound infection (Patient not taking: No sig reported)   Completed Course   hydrOXYzine (ATARAX/VISTARIL) 25 MG tablet Take 1 tablet (25 mg total) by mouth 3 (three) times daily as needed for anxiety. (Patient not taking: No sig reported) 75 tablet 0 Not Taking   mupirocin cream (BACTROBAN) 2 % Apply topically 2 (two) times daily. For wound care (Patient not taking: No sig reported) 15 g 0 Completed Course   neomycin-bacitracin-polymyxin (NEOSPORIN) OINT Apply 1 application topically 3 (three) times daily. For wound care (Patient not taking: No sig reported)   Not Taking   nicotine (NICODERM CQ - DOSED IN MG/24 HOURS) 21 mg/24hr patch Place 1 patch (21 mg total) onto the skin daily. (May buy from over the counter): For smoking cessation (Patient not taking: No sig reported) 28 patch 0 Not Taking   traZODone (DESYREL) 50 MG tablet Take 1 tablet (50 mg total) by mouth at bedtime as needed for sleep. (Patient not taking: No sig  reported) 30 tablet 0 Not Taking    Patient Stressors: Marital or family conflict Medication change or noncompliance  Patient Strengths: General fund of knowledge Motivation for treatment/growth  Treatment Modalities: Medication Management, Group therapy, Case management,  1 to 1 session with clinician, Psychoeducation, Recreational therapy.   Physician Treatment Plan for Primary Diagnosis: <principal problem not specified> Long Term Goal(s):     Short Term Goals:    Medication Management: Evaluate patient's response, side effects, and tolerance of medication regimen.  Therapeutic Interventions: 1 to 1 sessions, Unit Group sessions and Medication administration.  Evaluation of Outcomes: Not Met  Physician Treatment Plan for Secondary Diagnosis: Active  Problems:   MDD (major depressive disorder), recurrent severe, without psychosis ([place])  Long Term Goal(s):     Short Term Goals:       Medication Management: Evaluate patient's response, side effects, and tolerance of medication regimen.  Therapeutic Interventions: 1 to 1 sessions, Unit Group sessions and Medication administration.  Evaluation of Outcomes: Not Met   RN Treatment Plan for Primary Diagnosis: <principal problem not specified> Long Term Goal(s): Knowledge of disease and therapeutic regimen to maintain health will improve  Short Term Goals: Ability to participate in decision making will improve, Ability to identify and develop effective coping behaviors will improve, and Compliance with prescribed medications will improve  Medication Management: RN will administer medications as ordered by provider, will assess and evaluate patient's response and provide education to patient for prescribed medication. RN will report any adverse and/or side effects to prescribing provider.  Therapeutic Interventions: 1 on 1 counseling sessions, Psychoeducation, Medication administration, Evaluate responses to treatment, Monitor vital signs and CBGs as ordered, Perform/monitor CIWA, COWS, AIMS and Fall Risk screenings as ordered, Perform wound care treatments as ordered.  Evaluation of Outcomes: Not Met   LCSW Treatment Plan for Primary Diagnosis: <principal problem not specified> Long Term Goal(s): Safe transition to appropriate next level of care at discharge, Engage patient in therapeutic group addressing interpersonal concerns.  Short Term Goals: Engage patient in aftercare planning with referrals and resources, Increase emotional regulation, and Facilitate patient progression through stages of change regarding substance use diagnoses and concerns  Therapeutic Interventions: Assess for all discharge needs, 1 to 1 time with Social worker, Explore available resources and support systems,  Assess for adequacy in community support network, Educate family and significant other(s) on suicide prevention, Complete Psychosocial Assessment, Interpersonal group therapy.  Evaluation of Outcomes: Not Met   Progress in Treatment: Attending groups: No. Participating in groups: No. Taking medication as prescribed: Yes. Toleration medication: Yes. Family/Significant other contact made: No, will contact:  pt declined consents Patient understands diagnosis: Yes. Discussing patient identified problems/goals with staff: Yes. Medical problems stabilized or resolved: Yes. Denies suicidal/homicidal ideation: Yes. Issues/concerns per patient self-inventory: No. Other: None  New problem(s) identified: No, Describe:  None  New Short Term/Long Term Goal(s):medication stabilization, elimination of SI thoughts, development of comprehensive mental wellness plan.   Patient Goals:  "get into a program"  Discharge Plan or Barriers: Patient recently admitted. CSW will continue to follow and assess for appropriate referrals and possible discharge planning.   Reason for Continuation of Hospitalization: Depression Medication stabilization Suicidal ideation Withdrawal symptoms  Estimated Length of Stay: 3-5 days  Attendees: Patient: Angela Moran 12/04/20  Physician: Dr. Kai Levins 12/04/20  Nursing:  12/04/20  RN Care Manager: 12/04/20  Social Worker: Toney Reil, [place] 12/04/20  Recreational Therapist:  12/04/20  Other:  12/04/20  Other:  12/04/20  Other: 12/04/20  Scribe for Treatment Team: Eliott Nine 12/03/2020 2:27 PM

## 2020-12-03 NOTE — Progress Notes (Signed)
Patient ID: Angela Moran, female   DOB: July 28, 1990, 30 y.o.   MRN: [id number]  Admission Note:  D:29 yr female who presents [facility] in no acute distress for the treatment of SI / SA (fentanyl) and Depression. Pt appears flat and depressed. Pt was calm and cooperative with admission process. Pt presents with passive SI and contracts for safety upon admission. Pt denies AVH / pain . Pt stated she would like to go to a Tx facility, but not rehab , but will go if she needs to to get the help she needs.  Per assessment : Upon 05/21/2020 behavior health hospital discharge, patient also had an appointment scheduled on 05/27/2020 at 1:00 PM at the [facility] to start substance abuse intensive outpatient therapy as well as a walk-in appointment at the [facility] for psychotropic medication management on 06/17/2020 at 7:45 AM.  Patient states that she took the Wellbutrin, Vistaril, and trazodone prescriptions noted above that she was given upon behavioral health hospital discharge on 05/21/2020 as prescribed, but patient states that when she ran out of those prescriptions, she was never able to receive any additional prescriptions and she states that when she ran out of these Wellbutrin, Vistaril, and trazodone prescriptions in February 2022. Patient reports that she has not taken any additional psychotropic medications since she ran out of her Wellbutrin, Vistaril, and trazodone prescriptions noted above in February 2022.  Patient states that she is not taking any home medications at this time.  Patient also reports that she was never able to make it to the 06/17/20 walk-in psychotropic medication management appointment or the 05/27/20 substance abuse intensive outpatient therapy appointment noted above and she states that she has not seen an outpatient psychiatrist or therapist since she was discharged from the behavioral health Hospital on 05/21/2020. She reports that  she has attempted to contact [facility] twice since January 2022 to inquire about obtaining psychiatry appointments through [facility], but states that [facility] told her on both occasions that they were not accepting new patients at those times. Patient reports that she has been using about $20 worth of heroin/fentanyl (Patient states that she thinks what she uses is mostly fentanyl but she states that she thinks sometimes she uses heroin as well) daily via IV route (patient states that her injection sites are usually her arms and the sides of her neck) for the past 1.5 years.  Patient states that she started using heroin and fentanyl at the age of 30 years old.  Patient states that her last fentanyl use was about $20 worth via IV route earlier today on 12/02/2020 at around 2:00 PM.  Patient endorses history of the following withdrawal symptoms from opioids/heroin/fentanyl: Nausea, headaches, body aches/myalgias, abdominal cramping, decreased appetite, increased anxiety, and diaphoresis.  Patient denies any history of rhinorrhea, lacrimation, vomiting, or piloerection associated with her opioid withdrawal.  Patient states that she usually does not begin to experience withdrawal symptoms from opioids/heroin/fentanyl until about 2 days/48 hours after her last use of heroin/fentanyl. She denies history of seizures.  Patient endorses nausea, headache, dizziness, and lightheadedness currently on exam.  Patient states that she has been vomiting intermittently for the past 2 days.  Patient states that she has vomited 4 times over the past 2 days.  Patient states that her last episode of emesis was on the way over to the behavioral health Hospital around 5:30 PM/5:45 PM today on 12/02/2020.  Patient describes her vomit as brown in  color over the past 2 days and she states that she does not believe that she has seen any evidence of blood in her vomit.  Patient denies fever, chills, SOB, chest pain, LOC, or any additional physical  symptoms on exam at this time. patient also endorses using Adderall illegally "once in a blue moon".  Patient states that she uses Adderall about 2 times per month and she states that when she uses, she usually uses about 30 mg of Adderall.   A: Skin was assessed Herbert Seta RN) acne on face-tattoo right lower abdomen. PT searched and no contraband found, POC and unit policies explained and understanding verbalized. Consents obtained. Food and fluids offered, and fluids accepted.   R:Pt had no additional questions or concerns.

## 2020-12-03 NOTE — Progress Notes (Signed)
Group was not held due to staffing.  

## 2020-12-03 NOTE — [facility] Suicide Risk Assessment (Signed)
[facility] INPATIENT:  Family/Significant Other Suicide Prevention Education  Suicide Prevention Education:  Patient Refusal for Family/Significant Other Suicide Prevention Education: The patient Angela Moran has refused to provide written consent for family/significant other to be provided Family/Significant Other Suicide Prevention Education during admission and/or prior to discharge.  Physician notified.  [place] Kung Diane Hanel 12/03/2020, 11:06 AM

## 2020-12-03 NOTE — ED Provider Notes (Signed)
30 year old female received in signout from Angela Moran pending labs. Per her HPI:   "Angela Moran is a 30 y.o. female with a past medical history significant for seizures and depression who presents to the ED due to suicidal ideations.  Patient states she has a plan to overdose.  She admits to using heroin laced with fentanyl.  Denies HI and auditory/visual hallucinations. Chart reviewed.  Patient a recent admission in January 2022 for suicidal ideations.   Patient also endorses nausea and vomiting x2 days.  Admits to numerous episodes of nonbloody, nonbilious emesis. She does note some of her emesis was brown in color. Denies associated abdominal pain, chest pain, shortness of breath.  She admits to a previous C-section.  No urinary or vaginal symptoms.  No fever or chills.   History obtained from patient and past medical records. No interpreter used during encounter."   My evaluation, patient has a history of IV opioid use.  She typically injects in her neck.  Last use was within the last 24 hours.  She denies neck stiffness, worsening neck pain, rashes, visual changes, numbness, or weakness.  She denies any black or bloody stools.  Has a history of alcohol use, but has been abstinent from alcohol for many months.  She does report that she purchased fentanyl from a new individual 2 days ago, which is when the headache and vomiting began.  She does report that she has previously purchased the same drugs from this individual and has not developed any symptoms.  No known sick contacts.  Physical Exam  BP 101/62   Pulse 60   Temp 97.9 F (36.6 C) (Oral)   Resp 18   Ht 5\' 6"  (1.676 m)   Wt 74.8 kg   SpO2 100%   BMI 26.63 kg/m   Physical Exam Vitals and nursing note reviewed.  Constitutional:      General: She is not in acute distress.    Appearance: She is not ill-appearing, toxic-appearing or diaphoretic.     Comments: Nontoxic-appearing.  HENT:     Head: Normocephalic.      Mouth/Throat:     Mouth: Mucous membranes are moist.  Eyes:     Extraocular Movements: Extraocular movements intact.     Conjunctiva/sclera: Conjunctivae normal.     Pupils: Pupils are equal, round, and reactive to light.  Neck:     Comments: Track marks noted to the right lateral neck.  No surrounding erythema, edema, warmth, red streaking, fluctuant, or purulent drainage.  Carotid pulses are 2+ and symmetric.  No meningismus. Cardiovascular:     Rate and Rhythm: Normal rate and regular rhythm.     Heart sounds: No murmur heard.   No friction rub. No gallop.  Pulmonary:     Effort: Pulmonary effort is normal. No respiratory distress.     Breath sounds: No stridor. No wheezing, rhonchi or rales.  Chest:     Chest wall: No tenderness.  Abdominal:     General: There is no distension.     Palpations: Abdomen is soft. There is no mass.     Tenderness: There is no abdominal tenderness. There is no right CVA tenderness, left CVA tenderness, guarding or rebound.     Hernia: No hernia is present.  Musculoskeletal:     Cervical back: Neck supple.     Right lower leg: No edema.     Left lower leg: No edema.  Skin:    General: Skin is warm.  Findings: No rash.  Neurological:     Mental Status: She is alert.     Comments: Cranial nerves II through XII are grossly intact.  Sensation is intact and equal to the bilateral upper and lower extremities.  Ambulates without difficulty.  Alert and oriented x3.  Psychiatric:        Behavior: Behavior normal.    ED Course/Procedures   Clinical Course as of 12/03/20 0639  Wed Dec 02, 2020  2253 Opiates(!): POSITIVE [CA]  2337 AST(!): 224 [CA]  2337 ALT(!): 335 [CA]    Clinical Course User Index [CA] Mannie Stabile, PA-C    Procedures  MDM  30 year old female received in signout from [name] pending follow-up positive Hemoccults and ultimately medical clearance.  Please see her note for further work-up and medical decision making.    Vital signs are stable. Given concern for dark-colored emesis, Hemoccult was obtained prior to my evaluation and was positive.  However, hemoglobin is stable.  I have a very low suspicion for GI bleed, especially upper GI bleed with a normal BUN.  She does have elevated transaminases and in the setting of IV drug use, I do question hepatitis C that may have been undiagnosed.  She has no right upper quadrant tenderness suspicious for cholecystitis, ascending cholangitis.  Bilirubin is normal so doubt biliary obstruction.  Elevation of transaminases is not in a 2-1 AST to ALT ratio 2 suggest alcohol use.  Hepatitis panel has been ordered and is pending.  In regards to her headache, although she does have track marks noted on the right lateral neck, there is no evidence of infection.  She has no meningismus.  Doubt CVA, intracranial hemorrhage, meningitis.  She is not encephalopathic.  She has no back pain or chest pain suggestive of endocarditis or epidural abscess or other infectious etiology of the spine.  I suspect more that her symptoms may be secondary to opioid withdrawal.  However, she has been successfully fluid challenge.  She reports that she is feeling much better.  She can follow-up in the outpatient setting for her elevated transaminases once her hepatitis panel is pending.  Discussed with PA Angela Moran and patient has a bed at [facility].  COVID-19 test is negative and she is appropriate for transfer now that she is medically cleared.       Angela Moran A, PA-C 12/03/20 8756    Zadie Rhine, MD 12/04/20 [phone]

## 2020-12-03 NOTE — Progress Notes (Signed)
Pt spent most of the day in bed sleeping.  Pt complaining of generalized pain and nausea.  PRN medications were given per provider orders which have been effective for pt.  Pt denied SI/HI/AVH on assessment today.  Pt was pleasant and cooperative. RN will continue to monitor and provide support as needed.  12/03/20 1040  Psych Admission Type (Psych Patients Only)  Admission Status Voluntary  Psychosocial Assessment  Patient Complaints Anxiety  Eye Contact Fair  Facial Expression Anxious;Sad;Worried  Affect Anxious;Depressed  Speech Soft  Interaction Assertive  Motor Activity Slow  Appearance/Hygiene Disheveled  Behavior Characteristics Cooperative;Anxious  Mood Depressed;Anxious  Aggressive Behavior  Effect No apparent injury  Thought Process  Coherency WDL  Content WDL  Delusions WDL  Perception WDL  Hallucination None reported or observed  Judgment Poor  Confusion WDL  Danger to Self  Current suicidal ideation? Denies  Self-Injurious Behavior No self-injurious ideation or behavior indicators observed or expressed   Agreement Not to Harm Self Yes  Description of Agreement verbal contract for safety  Danger to Others  Danger to Others None reported or observed
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
## 2020-12-03 NOTE — Tx Team (Signed)
Initial Treatment Plan 12/03/2020 5:42 AM Angela Moran [id number]    PATIENT STRESSORS: Marital or family conflict Medication change or noncompliance   PATIENT STRENGTHS: General fund of knowledge Motivation for treatment/growth   PATIENT IDENTIFIED PROBLEMS: SA- fentanyl  housing  ""Housing, LT Tx (no rehab) but I will go if I need to"                 DISCHARGE CRITERIA:  Improved stabilization in mood, thinking, and/or behavior Verbal commitment to aftercare and medication compliance  PRELIMINARY DISCHARGE PLAN: Attend aftercare/continuing care group Attend PHP/IOP Placement in alternative living arrangements  PATIENT/FAMILY INVOLVEMENT: This treatment plan has been presented to and reviewed with the patient, Angela Moran.  The patient and family have been given the opportunity to ask questions and make suggestions.  Angela Haring, RN 12/03/2020, 5:42 AM
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
## 2020-12-03 NOTE — BHH Counselor (Signed)
CSW provided the Pt with a packet of resources that include: housing and [employer], free/reduced cost food resources, a [facility] application, [facility] information, [employer], and suicide prevention information.

## 2020-12-03 NOTE — BHH Counselor (Signed)
CSW contacted shelter and treatment centers for the Pt including:  [facility] (referral sent on 12/03/2020)  [facility] (referral sent on 12/03/2020)  [facility] (left voicemail)   [facility] (no answer and mailbox full)  [facility] (No voicemail available)  [facility] (Left Voicemail)  [facility] (Left voicemail and provided Pt with an application to complete)   All phone number will be provided for the Pt to follow up with.

## 2020-12-03 NOTE — Progress Notes (Signed)
Skin assessment completed.  Tattoo on right lower abdomen and acne on face.  No contraband found.

## 2020-12-04 LAB — HEMOGLOBIN A1C
Hgb A1c MFr Bld: 6 % — ABNORMAL HIGH (ref 4.8–5.6)
Mean Plasma Glucose: 125.5 mg/dL

## 2020-12-04 MED ORDER — THIAMINE HCL 100 MG PO TABS
100.0000 mg | ORAL_TABLET | Freq: Every day | ORAL | Status: DC
  Administered 2020-12-05 – 2020-12-10 (×6): 100 mg via ORAL
  Filled 2020-12-04 (×9): qty 1

## 2020-12-04 MED ORDER — BUPROPION HCL ER (XL) 150 MG PO TB24
150.0000 mg | ORAL_TABLET | Freq: Every day | ORAL | Status: DC
  Administered 2020-12-04 – 2020-12-05 (×2): 150 mg via ORAL
  Filled 2020-12-04 (×4): qty 1

## 2020-12-04 NOTE — Progress Notes (Signed)
Patient did not attend the evening speaker AA meeting.  

## 2020-12-04 NOTE — BHH Counselor (Signed)
CSW contacted [facility] about the referral that was placed on 12/03/2020.  CSW was informed that the Pt would need to contact [facility] by phone to complete a pre-screening before they could discuss available beds.  CSW provided the Pt with the phone number and asked her to contact [facility] today.

## 2020-12-04 NOTE — Progress Notes (Signed)
[facility]  12/04/2020 12:48 PM Angela GarlandCallie J Montano  MRN:  [id number] Subjective:   Virgina EvenerCallie Moran is a 30 year old female with a PPHx of MDD, anxiety, opioid use disorder, alcohol use disorder, methamphetamine use disorder presenting voluntarily for depression with SI with a plan to overdose.   The patient's chart was reviewed and nursing notes were reviewed. Over the past 24 hrs, there were no documented behavioral issues, no PRN medications given for agitation.The patient's case was discussed in multidisciplinary team meeting.   On interview this morning, patient is found lying in bed and is somewhat irritable. She says that she "does not want to be here" because she is "bored". She does however say that she is hopeful for substance use treatment and restarting Wellbutrin. She reports feeling minor nausea and no other symptoms (ROS as below). Her goals for the day include drawing, watching TV, and going to groups. She is informed about her positive Hep C Ab. All questions answered. She reports passive SI but contracts for safety. She denies AVH.     Principal Problem: MDD (major depressive disorder), recurrent severe, without psychosis (HCC) Diagnosis: Principal Problem:   MDD (major depressive disorder), recurrent severe, without psychosis (HCC) Active Problems:   Stimulant use disorder   Opiate abuse, continuous (HCC)  Total Time Spent in Direct Patient Care: I personally spent 30 minutes on the unit in direct patient care. The direct patient care time included face-to-face time with the patient, reviewing the patient's chart, communicating with other professionals, and coordinating care. Greater than 50% of this time was spent in counseling or coordinating care with the patient regarding goals of hospitalization, psycho-education, and discharge planning needs.   Past Psychiatric History: as above  Past Medical History:  Past Medical History:  Diagnosis Date   Depression     doing ok   HSV-2 infection    Infection    UTI   Pregnant    Seizures (HCC) 2014   related to drug use    Vaginal Pap smear, abnormal    clear on repeat    Past Surgical History:  Procedure Laterality Date   CESAREAN SECTION N/A 03/07/2018   Procedure: REPEAT CESAREAN SECTION;  Surgeon: [name], Jacob J, DO;  Location: [facility];  Service: Obstetrics;  Laterality: N/A;   NO PAST SURGERIES     Family History:  Family History  Problem Relation Age of Onset   Other Mother        Bone deteriation   Alcohol abuse Mother    Cirrhosis Father    Alcohol abuse Father    Diabetes Brother    Alcohol abuse Maternal Grandmother    Alcohol abuse Maternal Grandfather    Alcohol abuse Paternal Grandmother    Alcohol abuse Paternal Grandfather    Autism Brother    ADD / ADHD Brother    Family Psychiatric  History: depression in "everybody", no suicide, EtOH use disorder in mother and father  Social History: 1.5 ppd smoker for 8 yrs Reports having 1-2 drinks of alcohol per week, but a remote Hx of use disorder   Additional Social History:   Patient is currently homeless Abuses fentanyl (IV) and methamphetamine    Sleep: Fair  Appetite:  Fair  Current Medications: Current Facility-Administered Medications  Medication Dose Route Frequency Provider Last Rate Last Admin   acetaminophen (TYLENOL) tablet 650 mg  650 mg Oral Q6H PRN Jaclyn Shaggyaylor, Cody W, PA-C       alum &  mag hydroxide-simeth (MAALOX/MYLANTA) 200-200-20 MG/5ML suspension 30 mL  30 mL Oral Q4H PRN Melbourne Abts W, PA-C       buPROPion (WELLBUTRIN XL) 24 hr tablet 150 mg  150 mg Oral Daily Carlyn Reichert, MD   150 mg at 12/04/20 4481   dicyclomine (BENTYL) tablet 20 mg  20 mg Oral Q6H PRN Jaclyn Shaggy, PA-C   20 mg at 12/03/20 1042   hydrOXYzine (ATARAX/VISTARIL) tablet 25 mg  25 mg Oral TID PRN Jaclyn Shaggy, PA-C   25 mg at 12/04/20 0809   loperamide (IMODIUM) capsule 2-4 mg  2-4 mg Oral PRN Melbourne Abts W, PA-C        LORazepam (ATIVAN) tablet 1 mg  1 mg Oral Q6H PRN Carlyn Reichert, MD       magnesium hydroxide (MILK OF MAGNESIA) suspension 30 mL  30 mL Oral Daily PRN Melbourne Abts W, PA-C       methocarbamol (ROBAXIN) tablet 500 mg  500 mg Oral Q8H PRN Melbourne Abts W, PA-C   500 mg at 12/04/20 0809   multivitamin with minerals tablet 1 tablet  1 tablet Oral Daily Carlyn Reichert, MD   1 tablet at 12/04/20 8563   naproxen (NAPROSYN) tablet 500 mg  500 mg Oral BID PRN Jaclyn Shaggy, PA-C   500 mg at 12/03/20 1043   nicotine (NICODERM CQ - dosed in mg/24 hours) patch 14 mg  14 mg Transdermal Daily [name], Cody W, PA-C       ondansetron (ZOFRAN-ODT) disintegrating tablet 4 mg  4 mg Oral Q6H PRN Jaclyn Shaggy, PA-C   4 mg at 12/03/20 1043   pantoprazole (PROTONIX) EC tablet 40 mg  40 mg Oral Daily Antonieta Pert, MD   40 mg at 12/04/20 1497   thiamine tablet 100 mg  100 mg Oral Daily Antonieta Pert, MD       traZODone (DESYREL) tablet 50 mg  50 mg Oral QHS PRN Jaclyn Shaggy, PA-C   50 mg at 12/03/20 2135    Lab Results:  Results for orders placed or performed during the hospital encounter of 12/03/20 (from the past 48 hour(s))  Hemoglobin A1c     Status: Abnormal   Collection Time: 12/03/20  6:20 PM  Result Value Ref Range   Hgb A1c MFr Bld 6.0 (H) 4.8 - 5.6 %    Comment: (NOTE) Pre diabetes:          5.7%-6.4%  Diabetes:              >6.4%  Glycemic control for   <7.0% adults with diabetes    Mean Plasma Glucose 125.5 mg/dL    Comment: Performed at [facility], [address], [place], [place] [address]  Lipid panel     Status: Abnormal   Collection Time: 12/03/20  6:20 PM  Result Value Ref Range   Cholesterol 122 0 - 200 mg/dL   Triglycerides 858 <850 mg/dL   HDL 36 (L) >27 mg/dL   Total CHOL/HDL Ratio 3.4 RATIO   VLDL 22 0 - 40 mg/dL   LDL Cholesterol 64 0 - 99 mg/dL    Comment:        Total Cholesterol/HDL:CHD Risk Coronary Heart Disease Risk Table                      Men   Women  1/2 Average Risk   3.4   3.3  Average Risk  5.0   4.4  2 X Average Risk   9.6   7.1  3 X Average Risk  23.4   11.0        Use the calculated Patient Ratio above and the CHD Risk Table to determine the patient's CHD Risk.        ATP III CLASSIFICATION (LDL):  <100     mg/dL   Optimal  161-096  mg/dL   Near or Above                    Optimal  130-159  mg/dL   Borderline  045-409  mg/dL   High  >811     mg/dL   Very High Performed at [facility], [address], [place], [place] [address]   TSH     Status: None   Collection Time: 12/03/20  6:20 PM  Result Value Ref Range   TSH 0.774 0.350 - 4.500 uIU/mL    Comment: Performed by a 3rd Generation assay with a functional sensitivity of <=0.01 uIU/mL. Performed at [facility], [address] [place], [place] [address]     Blood Alcohol level:  Lab Results  Component Value Date   ETH <10 12/02/2020   ETH 29 (H) 05/17/2020    Metabolic Disorder Labs: Lab Results  Component Value Date   HGBA1C 6.0 (H) 12/03/2020   MPG 125.5 12/03/2020   MPG 96.8 05/18/2020   No results found for: PROLACTIN Lab Results  Component Value Date   CHOL 122 12/03/2020   TRIG 110 12/03/2020   HDL 36 (L) 12/03/2020   CHOLHDL 3.4 12/03/2020   VLDL 22 12/03/2020   LDLCALC 64 12/03/2020   LDLCALC 51 05/18/2020    Physical Findings: AIMS: NA CIWA:  CIWA-Ar Total: 0 COWS:  COWS Total Score: 0  Musculoskeletal: Strength & Muscle Tone: within normal limits Gait & Station: normal Patient leans: N/A  Psychiatric Specialty Exam:  Presentation  General Appearance: Disheveled; Appropriate for Environment  Eye Contact:Fair; Fleeting  Speech:Clear and Coherent; Normal Rate  Speech Volume:Normal  Handedness: No data recorded  Mood and Affect  Mood:Depressed  Affect:Congruent; Flat   Thought Process  Thought Processes:Coherent; Goal Directed; Linear  Descriptions of  Associations:Intact  Orientation:Full (Time, Place and Person)  Thought Content:Logical; WDL  History of Schizophrenia/Schizoaffective disorder:No  Duration of Psychotic Symptoms: none Hallucinations: none Ideas of Reference:None  Suicidal Thoughts: reports passive SI but contracts for safety Homicidal Thoughts: denies  Sensorium  Memory:Immediate Fair; Recent Fair; Remote Fair  Judgment: fair Insight:Fair   Executive Functions  Concentration:Fair  Attention Span:Fair  Recall:Fair  Fund of Knowledge:Fair  Language:Good   Psychomotor Activity  Psychomotor Activity: Normal  Assets  Assets:Communication Skills; Desire for Improvement; Leisure Time; Physical Health; Resilience; Social Support   Sleep  Sleep: 6.75 hrs  Physical Exam: Physical Exam Vitals and nursing note reviewed.  HENT:     Head: Normocephalic and atraumatic.  Pulmonary:     Effort: Pulmonary effort is normal.  Neurological:     General: No focal deficit present.     Mental Status: She is alert and oriented to person, place, and time.   Review of Systems  Respiratory:  Negative for shortness of breath.   Cardiovascular:  Negative for chest pain.  Gastrointestinal:  Positive for nausea. Negative for vomiting.  Neurological:  Negative for headaches.  Blood pressure 97/67, pulse 64, temperature 98.4 F (36.9 C), temperature source Oral, resp. rate 16, height 5'  6" (1.676 m), weight 74.8 kg, SpO2 98 %. Body mass index is 26.63 kg/m.  Treatment Plan Summary: Daily contact with patient to assess and evaluate symptoms and progress in treatment and Medication management  Safety and Monitoring -- VOLUNTARY admission to inpatient psychiatric unit for safety, stabilization and treatment -- Daily contact with patient to assess and evaluate symptoms and progress in treatment -- Patient's case to be discussed in multi-disciplinary team meeting -- Observation Level : q15 minute checks -- Vital  signs:  q12 hours -- Precautions: suicide   MDD recurrent severe without psychosis  She meets criteria for an active episode of MDD without psychosis. She endorses feeling depressed during a 2 month period that she was not using substances. She says that she will only take Wellbutrin for her antidepressant. Pt reports previous seizure at 30 years old after using K-2 and a remote Hx of alcohol abuse. Pt was clearly informed about risk of seizure in the future given this information and wishes to continue. She was offered alternative antidepressants.  -Continue Wellbutrin 150 mg daily (8/12) with plans to titrate to 300 mg -Supportive psychotherapy   Opioid use disorder, severe Patient reports 1 yr Hx of IVDU, mainly fentanyl, and reports symptoms consistent with active w/d -COWS scoring, most recently scored a 0 -Opioid w/d PRNs: Robaxin, Bentyl, Zofran, Imodium -Placement in residential rehab if patient is willing -Supportive psychotherapy and motivational interviewing   Hx Alcohol use disorder -Thiamine and MV -CIWA scoring -Ativan 1 mg q6hrs PRN for CIWA > 10   Continue PRN's: Tylenol, Maalox, Atarax, Milk of Magnesia, Trazodone   Medical Management Covid negative CMP: AST/ALT 224/335 (see below) CBC: unremarkable, Hgb of 15 EtOH: <10 UDS: opiates Upreg: neg TSH: 0.8 A1C: 6.0 Lipids: unremarkable   Transaminitis in the context of IVDU On admission her AST was elevated at 224 and her ALT at 335. Review of the electronic medical record revealed that her last laboratories that were obtained in January revealed a normal AST at 24 and normal ALT at 28. In 2020 her liver function enzymes were elevated mildly with an AST of 89 and an ALT of 132. C/f hepatitis C or other acute liver injury given Hx of IVDU. Synthetic liver function intact (INR/PTT/plts), no signs of HE or liver disease on physical exam.  -Hepatitis panel: Hep C Ab reactive, RNA pending -Will need referral to GI as an  OP -HIV test: pending  Carlyn Reichert PGY-1, Psychiatry

## 2020-12-04 NOTE — Progress Notes (Signed)
Roselin has been in her room much of the evening.  Her blood pressure was low at the beginning of the shift and was provided Gatorade and water.  Recheck of BP was WNL.  She did come out of her room for medications and snack.  She did not come out for groups "I just wanted to sleep" as well feeling bored.  Encouraged participation in group and unit activities.  She denies SI/HI or AVH.  She is currently resting with her eyes closed appears to be asleep.  Q 15 minute checks maintained for safety.   12/03/20 2135  Psych Admission Type (Psych Patients Only)  Admission Status Voluntary  Psychosocial Assessment  Patient Complaints Depression;Isolation  Eye Contact Fair  Facial Expression Anxious;Sad;Worried  Affect Anxious;Depressed  Speech Soft  Interaction Assertive  Motor Activity Slow  Appearance/Hygiene Disheveled  Behavior Characteristics Unwilling to participate  Mood Depressed;Anxious  Thought Process  Coherency WDL  Content Blaming others  Delusions WDL  Perception WDL  Hallucination None reported or observed  Judgment Poor  Confusion WDL  Danger to Self  Current suicidal ideation? Denies  Danger to Others  Danger to Others None reported or observed

## 2020-12-04 NOTE — [facility] Group Notes (Signed)
Type of Therapy and Topic: Group Therapy: Overcoming Obstacles  Participation Level: Did Not Attend  Description of Group: In this group patients will be encouraged to explore what they see as obstacles to their own wellness and recovery. They will be guided to discuss their thoughts, feelings, and behaviors related to these obstacles. The group will process together ways to cope with barriers, with attention given to specific choices patients can make. Each patient will be challenged to identify changes they are motivated to make in order to overcome their obstacles. This group will be process-oriented, with patients participating in exploration of their own experiences as well as giving and receiving support and challenge from other group members.  Therapeutic Goals: 1. Patient will identify personal and current obstacles as they relate to admission. 2. Patient will identify barriers that currently interfere with their wellness or overcoming obstacles. 3. Patient will identify feelings, thought process and behaviors related to these barriers. 4. Patient will identify two changes they are willing to make to overcome these obstacles:  Summary of Patient Progress: Did not attend  

## 2020-12-04 NOTE — Progress Notes (Signed)
Pt has been A&Ox4, denies SI/HI/AVH, irritable, anxious, depressed, asked not to wake her for lunch, makes poor eye contact, isolative, spends most of the day in bed. Will continue to monitor pt per Q15 minute face checks and monitor for safety and progress.

## 2020-12-05 LAB — HIV-1 RNA QUANT-NO REFLEX-BLD
HIV 1 RNA Quant: <20 copies/mL
LOG10 HIV-1 RNA: UNDETERMINED log10copy/mL

## 2020-12-05 MED ORDER — BUPROPION HCL ER (XL) 300 MG PO TB24
300.0000 mg | ORAL_TABLET | Freq: Every day | ORAL | Status: DC
  Administered 2020-12-06 – 2020-12-10 (×5): 300 mg via ORAL
  Filled 2020-12-05 (×6): qty 1
  Filled 2020-12-05: qty 7

## 2020-12-05 NOTE — Progress Notes (Signed)
Pt noted with irritable mood, flat affect, depressed and fidgety on initial approach at medication window. Reports fair sleep last night with good appetite, low energy and poor concentration level. Rates her anxiety 7/10, hopelessness and depression both 9/10 with current stressors being "withdrawals and where I'm going when I leave here". Pt denies SI, HI and AVH. Reports generalized body aches / discomfort. Received PRN Tylenol 650 mg, Vistaril and Zofran as well at 0824 for complain of nausea and anxiety this shift. Pt observed resting in bed; respirations noted and unlabored when reassessed at 0925. CIWA score this AM 5. Emotional support offered to pt this shift. Writer encouraged pt to comply with current treatment regimen including scheduled unit groups. All medications given as ordered and effects monitored. Q 15 minutes safety checks maintained without self harm gestures.  Pt isolative to room majority of this shift in bed. OOB for medications and phone call only. Declined group despite multiple prompts. Tolerates meals fairly. Denies concerns at this time.

## 2020-12-05 NOTE — Plan of Care (Signed)
  Problem: Education: Goal: Emotional status will improve Outcome: Not Progressing Goal: Mental status will improve Outcome: Not Progressing   Problem: Activity: Goal: Interest or engagement in activities will improve Outcome: Not Progressing   

## 2020-12-05 NOTE — [facility] Group Notes (Signed)
LCSW Group Therapy Note 12/05/2020  10:00-11:00am  Type of Therapy and Topic:  Group Therapy: Anger and Commonalities  Participation Level:  Did Not Attend   Description of Group: In this group, patients initially shared an "unknown" fact about themselves and CSW led a discussion about the ways in which we have things in common without realizing it.  Patient then identified a recent time they became angry and how this yet again showed a way in which they had something in common with other patients.  We discussed possible unhealthy reactions to anger and possible healthy reactions.  We also discussed possible underlying emotions that lead to the anger.  Commonalities among group members were pointed out throughout the entirety of group.  Therapeutic Goals: Patients were asked to share something about themselves and learned that they often have things in common with other people without knowing this Patients will remember their last incident of anger and how they reacted Patients will be able to identify their reaction as healthy or unhealthy, and identify possible reactions that would have been the opposite Patients will learn that anger itself is a secondary emotion and will think about their primary emotion at the time of their last incident of anger  Summary of Patient Progress:  The patient was invited to group, did not attend.  Therapeutic Modalities:   Cognitive Behavioral Therapy  Lynnell Chad, LCSW 12/05/2020  9:45 AM

## 2020-12-05 NOTE — Progress Notes (Signed)
   D:  Pt presents with high anxiety (7/10) and depression (9/10).  Pt administered PRN Vistaril per MAR.  Pt reports, "just don't know the next step after this."  Pt reports, "I have done other programs before and just blew them off."  Pt becomes tearful during assessment stating, "my son is my why?"  Pt provided emotional support.  Pt denies SI/HI, and verbally contracts for safety.  Pt denies AVH.  A:  Labs/Vitals monitored; Medication education provided; Pt encouraged to communicate concerns.  R:  Pt remains safe on unit with Q 15 minute safety checks.

## 2020-12-05 NOTE — Progress Notes (Signed)
Psychoeducational Group Note  Date:  12/05/2020 Time:  2015  Group Topic/Focus:  Wrap up group  Participation Level: Did Not Attend  Participation Quality:  Not Applicable  Affect:  Not Applicable  Cognitive:  Not Applicable  Insight:  Not Applicable  Engagement in Group: Not Applicable  Additional Comments:  Did not attend.   Marcille Buffy 12/05/2020, 9:09 PM

## 2020-12-05 NOTE — Progress Notes (Signed)
[facility] MD Progress Note  12/05/2020 10:59 AM Angela Moran  MRN:  [id number] Subjective:   Angela Moran is a 30 year old female with a PPHx of MDD, anxiety, opioid use disorder, alcohol use disorder, methamphetamine use disorder presenting voluntarily for depression with SI with a plan to overdose.   The patient's chart was reviewed and nursing notes were reviewed. Over the past 24 hrs, there were no documented behavioral issues, no PRN medications given for agitation.The patient's case was discussed in multidisciplinary team meeting.   On interview this morning, patient is found lying in bed and is somewhat irritable. Patient reports feeling "not happy" and says that she wants to go to a "treatment center" not a rehab. This treatment center would help her get a job and "back on my feet". She was supposed to be making phone calls with [employer], but apparently has not done this. She states that her plan is to call her family today and see if they will provide housing for her. If not, patient says that she will go to rehab. LCSW to speak with patient today regarding options. She asks further questions about her Hep C diagnosis and treatment. She denies SI and contracts for safety. She denies AVH. ROS as below.     Principal Problem: MDD (major depressive disorder), recurrent severe, without psychosis (HCC) Diagnosis: Principal Problem:   MDD (major depressive disorder), recurrent severe, without psychosis (HCC) Active Problems:   Stimulant use disorder   Opiate abuse, continuous (HCC)  Total Time Spent in Direct Patient Care: I personally spent 30 minutes on the unit in direct patient care. The direct patient care time included face-to-face time with the patient, reviewing the patient's chart, communicating with other professionals, and coordinating care. Greater than 50% of this time was spent in counseling or coordinating care with the patient regarding goals of hospitalization, psycho-education,  and discharge planning needs.   Past Psychiatric History: as above  Past Medical History:  Past Medical History:  Diagnosis Date   Depression    doing ok   HSV-2 infection    Infection    UTI   Pregnant    Seizures (HCC) 2014   related to drug use    Vaginal Pap smear, abnormal    clear on repeat    Past Surgical History:  Procedure Laterality Date   CESAREAN SECTION N/A 03/07/2018   Procedure: REPEAT CESAREAN SECTION;  Surgeon: Levie Heritage, DO;  Location: [facility];  Service: Obstetrics;  Laterality: N/A;   NO PAST SURGERIES     Family History:  Family History  Problem Relation Age of Onset   Other Mother        Bone deteriation   Alcohol abuse Mother    Cirrhosis Father    Alcohol abuse Father    Diabetes Brother    Alcohol abuse Maternal Grandmother    Alcohol abuse Maternal Grandfather    Alcohol abuse Paternal Grandmother    Alcohol abuse Paternal Grandfather    Autism Brother    ADD / ADHD Brother    Family Psychiatric  History: depression in "everybody", no suicide, EtOH use disorder in mother and father  Social History: 1.5 ppd smoker for 8 yrs Reports having 1-2 drinks of alcohol per week, but a remote Hx of use disorder   Additional Social History:   Patient is currently homeless Abuses fentanyl (IV) and methamphetamine    Sleep: Fair  Appetite:  Fair  Current Medications: Current Facility-Administered Medications  Medication Dose Route Frequency Provider Last Rate Last Admin   acetaminophen (TYLENOL) tablet 650 mg  650 mg Oral Q6H PRN Jaclyn Shaggy, PA-C   650 mg at 12/05/20 0824   alum & mag hydroxide-simeth (MAALOX/MYLANTA) 200-200-20 MG/5ML suspension 30 mL  30 mL Oral Q4H PRN Jaclyn Shaggy, PA-C       buPROPion (WELLBUTRIN XL) 24 hr tablet 150 mg  150 mg Oral Daily Carlyn Reichert, MD   150 mg at 12/05/20 6712   dicyclomine (BENTYL) tablet 20 mg  20 mg Oral Q6H PRN Jaclyn Shaggy, PA-C   20 mg at 12/03/20 1042    hydrOXYzine (ATARAX/VISTARIL) tablet 25 mg  25 mg Oral TID PRN Jaclyn Shaggy, PA-C   25 mg at 12/05/20 4580   loperamide (IMODIUM) capsule 2-4 mg  2-4 mg Oral PRN Melbourne Abts W, PA-C       LORazepam (ATIVAN) tablet 1 mg  1 mg Oral Q6H PRN Carlyn Reichert, MD       magnesium hydroxide (MILK OF MAGNESIA) suspension 30 mL  30 mL Oral Daily PRN Melbourne Abts W, PA-C       methocarbamol (ROBAXIN) tablet 500 mg  500 mg Oral Q8H PRN Melbourne Abts W, PA-C   500 mg at 12/04/20 0809   multivitamin with minerals tablet 1 tablet  1 tablet Oral Daily Carlyn Reichert, MD   1 tablet at 12/05/20 9983   naproxen (NAPROSYN) tablet 500 mg  500 mg Oral BID PRN Jaclyn Shaggy, PA-C   500 mg at 12/03/20 1043   nicotine (NICODERM CQ - dosed in mg/24 hours) patch 14 mg  14 mg Transdermal Daily [name], Cody W, PA-C       ondansetron (ZOFRAN-ODT) disintegrating tablet 4 mg  4 mg Oral Q6H PRN Jaclyn Shaggy, PA-C   4 mg at 12/05/20 0825   pantoprazole (PROTONIX) EC tablet 40 mg  40 mg Oral Daily Antonieta Pert, MD   40 mg at 12/05/20 3825   thiamine tablet 100 mg  100 mg Oral Daily Antonieta Pert, MD   100 mg at 12/05/20 0539   traZODone (DESYREL) tablet 50 mg  50 mg Oral QHS PRN Jaclyn Shaggy, PA-C   50 mg at 12/04/20 2115    Lab Results:  Results for orders placed or performed during the hospital encounter of 12/03/20 (from the past 48 hour(s))  Hemoglobin A1c     Status: Abnormal   Collection Time: 12/03/20  6:20 PM  Result Value Ref Range   Hgb A1c MFr Bld 6.0 (H) 4.8 - 5.6 %    Comment: (NOTE) Pre diabetes:          5.7%-6.4%  Diabetes:              >6.4%  Glycemic control for   <7.0% adults with diabetes    Mean Plasma Glucose 125.5 mg/dL    Comment: Performed at [facility], [address] [place], [place] [address]  Lipid panel     Status: Abnormal   Collection Time: 12/03/20  6:20 PM  Result Value Ref Range   Cholesterol 122 0 - 200 mg/dL   Triglycerides 193 <790 mg/dL   HDL 36 (L)  >24 mg/dL   Total CHOL/HDL Ratio 3.4 RATIO   VLDL 22 0 - 40 mg/dL   LDL Cholesterol 64 0 - 99 mg/dL    Comment:        Total Cholesterol/HDL:CHD Risk Coronary Heart Disease Risk  Table                     Men   Women  1/2 Average Risk   3.4   3.3  Average Risk       5.0   4.4  2 X Average Risk   9.6   7.1  3 X Average Risk  23.4   11.0        Use the calculated Patient Ratio above and the CHD Risk Table to determine the patient's CHD Risk.        ATP III CLASSIFICATION (LDL):  <100     mg/dL   Optimal  185-631  mg/dL   Near or Above                    Optimal  130-159  mg/dL   Borderline  497-026  mg/dL   High  >378     mg/dL   Very High Performed at [facility], [address], [place], [place] [address]   TSH     Status: None   Collection Time: 12/03/20  6:20 PM  Result Value Ref Range   TSH 0.774 0.350 - 4.500 uIU/mL    Comment: Performed by a 3rd Generation assay with a functional sensitivity of <=0.01 uIU/mL. Performed at [facility], [address], [place], [place] [address]     Blood Alcohol level:  Lab Results  Component Value Date   ETH <10 12/02/2020   ETH 29 (H) 05/17/2020    Metabolic Disorder Labs: Lab Results  Component Value Date   HGBA1C 6.0 (H) 12/03/2020   MPG 125.5 12/03/2020   MPG 96.8 05/18/2020   No results found for: PROLACTIN Lab Results  Component Value Date   CHOL 122 12/03/2020   TRIG 110 12/03/2020   HDL 36 (L) 12/03/2020   CHOLHDL 3.4 12/03/2020   VLDL 22 12/03/2020   LDLCALC 64 12/03/2020   LDLCALC 51 05/18/2020    Physical Findings: AIMS: NA CIWA:  CIWA-Ar Total: 6, 4 for anxiety COWS:  COWS Total Score: 4, 2 for anxiety, 1 for diaphoresis, 1 for myalgias  Musculoskeletal: Strength & Muscle Tone: within normal limits Gait & Station: normal Patient leans: N/A  Psychiatric Specialty Exam:  Presentation  General Appearance: Disheveled; Appropriate for Environment  Eye  Contact:Fair; Fleeting  Speech:Clear and Coherent; Normal Rate  Speech Volume:Normal  Handedness: No data recorded  Mood and Affect  Mood:Depressed  Affect:Congruent; Flat   Thought Process  Thought Processes:Coherent; Goal Directed; Linear  Descriptions of Associations:Intact  Orientation:Full (Time, Place and Person)  Thought Content:Logical; WDL  History of Schizophrenia/Schizoaffective disorder:No  Duration of Psychotic Symptoms: none Hallucinations: none Ideas of Reference:None  Suicidal Thoughts: denies SI this morning Homicidal Thoughts: denies  Sensorium  Memory:Immediate Fair; Recent Fair; Remote Fair  Judgment: fair Insight:Fair   Executive Functions  Concentration:Fair  Attention Span:Fair  Recall:Fair  Fund of Knowledge:Fair  Language:Good   Psychomotor Activity  Psychomotor Activity: Normal  Assets  Assets:Communication Skills; Desire for Improvement; Leisure Time; Physical Health; Resilience; Social Support   Sleep  Sleep: 6.75 hrs  Physical Exam: Physical Exam Vitals and nursing note reviewed.  HENT:     Head: Normocephalic and atraumatic.  Pulmonary:     Effort: Pulmonary effort is normal.  Neurological:     General: No focal deficit present.     Mental Status: She is alert and oriented to person, place, and time.  Review of Systems  Respiratory:  Negative for shortness of breath.   Cardiovascular:  Negative for chest pain.  Gastrointestinal:  Positive for nausea. Negative for vomiting.  Neurological:  Negative for headaches.  Blood pressure 98/65, pulse 78, temperature 98.1 F (36.7 C), temperature source Oral, resp. rate 16, height 5\' 6"  (1.676 m), weight 74.8 kg, SpO2 100 %. Body mass index is 26.63 kg/m.  Treatment Plan Summary: Daily contact with patient to assess and evaluate symptoms and progress in treatment and Medication management  Safety and Monitoring -- VOLUNTARY admission to inpatient psychiatric  unit for safety, stabilization and treatment -- Daily contact with patient to assess and evaluate symptoms and progress in treatment -- Patient's case to be discussed in multi-disciplinary team meeting -- Observation Level : q15 minute checks -- Vital signs:  q12 hours -- Precautions: suicide   MDD recurrent severe without psychosis  She meets criteria for an active episode of MDD without psychosis. She endorses feeling depressed during a 2 month period that she was not using substances. She says that she will only take Wellbutrin for her antidepressant. Pt reports previous seizure at [age] years old after using K-2 and a remote Hx of alcohol abuse. Pt was clearly informed about risk of seizure in the future given this information and wishes to continue. She was offered alternative antidepressants.  -Increase Wellbutrin XL to 300 mg (8/14) -Supportive psychotherapy   Opioid use disorder, severe Patient reports 1 yr Hx of IVDU, mainly fentanyl, and reports symptoms consistent with active w/d -COWS scoring, most recently scored a 0 -Opioid w/d PRNs: Robaxin, Bentyl, Zofran, Imodium -Placement in residential rehab if patient is willing -Supportive psychotherapy and motivational interviewing   Hx Alcohol use disorder -Thiamine and MV -CIWA scoring -Ativan 1 mg q6hrs PRN for CIWA > 10   Continue PRN's: Tylenol, Maalox, Atarax, Milk of Magnesia, Trazodone   Medical Management Covid negative CMP: AST/ALT 224/335 (see below) CBC: unremarkable, Hgb of 15 EtOH: <10 UDS: opiates Upreg: neg TSH: 0.8 A1C: 6.0 Lipids: unremarkable   Transaminitis in the context of IVDU On admission her AST was elevated at 224 and her ALT at 335. Review of the electronic medical record revealed that her last laboratories that were obtained in January revealed a normal AST at 24 and normal ALT at 28. In 2020 her liver function enzymes were elevated mildly with an AST of 89 and an ALT of 132. C/f hepatitis C or  other acute liver injury given Hx of IVDU. Synthetic liver function intact (INR/PTT/plts), no signs of HE or liver disease on physical exam.  -Hepatitis panel: Hep C Ab reactive, RNA pending -Will need referral to GI as an OP -HIV test: pending  Carlyn ReichertNick Daxtin Leiker PGY-1, Psychiatry

## 2020-12-05 NOTE — Progress Notes (Signed)
     12/04/20 2116  Psych Admission Type (Psych Patients Only)  Admission Status Voluntary  Psychosocial Assessment  Patient Complaints Anxiety;Depression  Eye Contact Fair  Facial Expression Anxious;Sad;Worried  Affect Anxious;Depressed  Speech Soft  Interaction Assertive  Motor Activity Slow  Appearance/Hygiene Disheveled  Behavior Characteristics Cooperative;Calm  Mood Anxious;Labile;Irritable  Thought Process  Coherency WDL  Content Blaming others  Delusions WDL  Perception WDL  Hallucination None reported or observed  Judgment Poor  Confusion WDL  Danger to Self  Current suicidal ideation? Denies  Self-Injurious Behavior No self-injurious ideation or behavior indicators observed or expressed   Agreement Not to Harm Self Yes  Description of Agreement verbal contract for safety  Danger to Others  Danger to Others None reported or observed

## 2020-12-05 NOTE — B[facility] Counselor (Signed)
Clinical Social Work Note  At [employer] request, CSW met with patient in her room.  She was irritable and gave short answers.  She was reminded that she is supposed to making phone calls to [facility], and said that every time she goes out in the hall to make a phone call, somebody else is already on the phone.  She asked if CSW could get in touch with CPS, and CSW explained that it is the weekend.  She said thank you and lay down in the bed.  Selmer Dominion, LCSW 12/05/2020, 1:38 PM

## 2020-12-06 MED ORDER — HYDROXYZINE HCL 50 MG PO TABS
50.0000 mg | ORAL_TABLET | Freq: Three times a day (TID) | ORAL | Status: DC | PRN
  Administered 2020-12-06 – 2020-12-10 (×9): 50 mg via ORAL
  Filled 2020-12-06: qty 10
  Filled 2020-12-06 (×9): qty 1

## 2020-12-06 MED ORDER — GABAPENTIN 300 MG PO CAPS
300.0000 mg | ORAL_CAPSULE | Freq: Three times a day (TID) | ORAL | Status: DC
  Administered 2020-12-06 – 2020-12-07 (×4): 300 mg via ORAL
  Filled 2020-12-06 (×9): qty 1

## 2020-12-06 NOTE — Progress Notes (Signed)
     12/06/20 0625  Vital Signs  Temp 98 F (36.7 C)  Temp Source Oral  Pulse Rate 65  Pulse Rate Source Monitor  BP 94/64  BP Location Right Arm  BP Method Automatic  Patient Position (if appropriate) Sitting  Oxygen Therapy  SpO2 100 %       12/06/20 0626  Vital Signs  Pulse Rate 77  BP (!) 125/110  BP Location Right Arm  BP Method Automatic  Patient Position (if appropriate) Standing    Provider notified.  Pt's CIWA = 8 and COW = 5.  Pt offered medication for anxiety, but refused.  Pt stated, "I didn't sleep well last night."  Will report to day shift nurse and encourage additional fluids.

## 2020-12-06 NOTE — Progress Notes (Signed)
[facility] MD Progress Note  12/06/2020 12:33 PM [name]  MRN:  [id number] Subjective:   Angela Moran is a 30 year old female with a PPHx of MDD, anxiety, opioid use disorder, alcohol use disorder, methamphetamine use disorder presenting voluntarily for depression with SI with a plan to overdose.   The patient's chart was reviewed and nursing notes were reviewed. Over the past 24 hrs, there were no documented behavioral issues, no PRN medications given for agitation.  Patient received PRN Tylenol, Vistaril, Zofran, trazodone.  RN requested for Vistaril to be increased to 50 mg p.o. as needed for patient's anxiety. The patient's case was discussed in multidisciplinary team meeting.   On interview this morning, patient is found lying in bed and is somewhat irritable. Patient reports feeling "kind of ill, unhappy" and says that she wants to go to a "treatment center" not a rehab.  Stated that she called [facility] yesterday, however no one picked up.  Stated that she will call again today. Patient reported a very negative world view, stated that "what is the point, I have no control over anything in general". Provided patient with encouragement. Patient reported appetite is "okay", sleep is "poor".  Stated that it is probably because she stays asleep all day. Patient denied SI/HI/AVH, delusions, paranoia, first rank symptoms.     Principal Problem: MDD (major depressive disorder), recurrent severe, without psychosis (HCC) Diagnosis: Principal Problem:   MDD (major depressive disorder), recurrent severe, without psychosis (HCC) Active Problems:   Stimulant use disorder   Opiate abuse, continuous (HCC)  Total Time Spent in Direct Patient Care: I personally spent 30 minutes on the unit in direct patient care. The direct patient care time included face-to-face time with the patient, reviewing the patient's chart, communicating with other professionals, and coordinating care. Greater than 50% of this  time was spent in counseling or coordinating care with the patient regarding goals of hospitalization, psycho-education, and discharge planning needs.   Past Psychiatric History: as above  Past Medical History:  Past Medical History:  Diagnosis Date   Depression    doing ok   HSV-2 infection    Infection    UTI   Pregnant    Seizures (HCC) 2014   related to drug use    Vaginal Pap smear, abnormal    clear on repeat    Past Surgical History:  Procedure Laterality Date   CESAREAN SECTION N/A 03/07/2018   Procedure: REPEAT CESAREAN SECTION;  Surgeon: Levie Heritage, DO;  Location: [facility];  Service: Obstetrics;  Laterality: N/A;   NO PAST SURGERIES     Family History:  Family History  Problem Relation Age of Onset   Other Mother        Bone deteriation   Alcohol abuse Mother    Cirrhosis Father    Alcohol abuse Father    Diabetes Brother    Alcohol abuse Maternal Grandmother    Alcohol abuse Maternal Grandfather    Alcohol abuse Paternal Grandmother    Alcohol abuse Paternal Grandfather    Autism Brother    ADD / ADHD Brother    Family Psychiatric  History: depression in "everybody", no suicide, EtOH use disorder in mother and father  Social History: 1.5 ppd smoker for 8 yrs Reports having 1-2 drinks of alcohol per week, but a remote Hx of use disorder   Additional Social History:   Patient is currently homeless Abuses fentanyl (IV) and methamphetamine    Sleep: Fair  Appetite:  Fair  Current Medications: Current Facility-Administered Medications  Medication Dose Route Frequency Provider Last Rate Last Admin   acetaminophen (TYLENOL) tablet 650 mg  650 mg Oral Q6H PRN Jaclyn Shaggyaylor, Cody W, PA-C   650 mg at 12/05/20 0824   alum & mag hydroxide-simeth (MAALOX/MYLANTA) 200-200-20 MG/5ML suspension 30 mL  30 mL Oral Q4H PRN Melbourne Abtsaylor, Cody W, PA-C       buPROPion (WELLBUTRIN XL) 24 hr tablet 300 mg  300 mg Oral Daily Carlyn ReichertGabrielle, Nick, MD   300 mg at 12/06/20  0805   dicyclomine (BENTYL) tablet 20 mg  20 mg Oral Q6H PRN Jaclyn Shaggyaylor, Cody W, PA-C   20 mg at 12/03/20 1042   hydrOXYzine (ATARAX/VISTARIL) tablet 50 mg  50 mg Oral TID PRN Princess BruinsNguyen, Makinzy Cleere, DO       loperamide (IMODIUM) capsule 2-4 mg  2-4 mg Oral PRN Melbourne Abtsaylor, Cody W, PA-C       LORazepam (ATIVAN) tablet 1 mg  1 mg Oral Q6H PRN Carlyn ReichertGabrielle, Nick, MD       magnesium hydroxide (MILK OF MAGNESIA) suspension 30 mL  30 mL Oral Daily PRN Melbourne Abtsaylor, Cody W, PA-C       methocarbamol (ROBAXIN) tablet 500 mg  500 mg Oral Q8H PRN Melbourne Abtsaylor, Cody W, PA-C   500 mg at 12/04/20 0809   multivitamin with minerals tablet 1 tablet  1 tablet Oral Daily Carlyn ReichertGabrielle, Nick, MD   1 tablet at 12/06/20 0805   naproxen (NAPROSYN) tablet 500 mg  500 mg Oral BID PRN Jaclyn Shaggyaylor, Cody W, PA-C   500 mg at 12/06/20 [id number]   nicotine (NICODERM CQ - dosed in mg/24 hours) patch 14 mg  14 mg Transdermal Daily Melbourne Abtsaylor, Cody W, PA-C   14 mg at 12/06/20 0804   ondansetron (ZOFRAN-ODT) disintegrating tablet 4 mg  4 mg Oral Q6H PRN Jaclyn Shaggyaylor, Cody W, PA-C   4 mg at 12/05/20 0825   pantoprazole (PROTONIX) EC tablet 40 mg  40 mg Oral Daily Antonieta Pertlary, Greg Lawson, MD   40 mg at 12/06/20 0805   thiamine tablet 100 mg  100 mg Oral Daily Antonieta Pertlary, Greg Lawson, MD   100 mg at 12/06/20 0805   traZODone (DESYREL) tablet 50 mg  50 mg Oral QHS PRN Jaclyn Shaggyaylor, Cody W, PA-C   50 mg at 12/05/20 2152    Lab Results:  No results found for this or any previous visit (from the past 48 hour(s)).   Blood Alcohol level:  Lab Results  Component Value Date   ETH <10 12/02/2020   ETH 29 (H) 05/17/2020    Metabolic Disorder Labs: Lab Results  Component Value Date   HGBA1C 6.0 (H) 12/03/2020   MPG 125.5 12/03/2020   MPG 96.8 05/18/2020   No results found for: PROLACTIN Lab Results  Component Value Date   CHOL 122 12/03/2020   TRIG 110 12/03/2020   HDL 36 (L) 12/03/2020   CHOLHDL 3.4 12/03/2020   VLDL 22 12/03/2020   LDLCALC 64 12/03/2020   LDLCALC 51 05/18/2020     Physical Findings: AIMS: NA CIWA:  CIWA-Ar Total: 8, 4 for anxiety COWS:  COWS Total Score: 5, 2 for anxiety, 1 for diaphoresis, 1 for myalgias  Musculoskeletal: Strength & Muscle Tone: within normal limits Gait & Station: normal Patient leans: N/A  Psychiatric Specialty Exam:  Presentation  General Appearance: Disheveled  Eye Contact:Minimal Mostly looking at the wall  Speech:Clear and Coherent; Normal Rate Spontaneous  Speech Volume:Normal  Handedness: Right  Mood and Affect  Mood:Dysphoric;  Depressed; Hopeless  Affect:Congruent   Thought Process  Thought Processes:Coherent; Goal Directed; Linear  Descriptions of Associations:Intact  Orientation:Full (Time, Place and Person)  Thought Content:Rumination Ruminates on on negative world view and her lack control of her life. Patient denied SI/HI/AVH, delusions, paranoia, first rank symptoms. Patient is not grossly responding to internal/external stimuli on exam and did not make delusional statements.  History of Schizophrenia/Schizoaffective disorder:No  Duration of Psychotic Symptoms: none Hallucinations: none Ideas of Reference:None  Suicidal Thoughts: denies  Homicidal Thoughts: denies  Sensorium  Memory:Immediate Fair; Recent Fair; Remote Fair  Judgment: Poor Insight:Fair   Executive Functions  Concentration:Good  Attention Span:Good  Recall:Fair  Fund of Knowledge:Fair  Language:Good   Psychomotor Activity  Psychomotor Activity: Normal  Assets  Assets:Communication Skills; Desire for Improvement; Leisure Time; Resilience   Sleep  Sleep: 6 hrs  Physical Exam: Physical Exam Vitals and nursing note reviewed.  HENT:     Head: Normocephalic and atraumatic.  Pulmonary:     Effort: Pulmonary effort is normal.  Neurological:     General: No focal deficit present.     Mental Status: She is alert and oriented to person, place, and time.   Review of Systems  Respiratory:   Negative for shortness of breath.   Cardiovascular:  Negative for chest pain.  Gastrointestinal:  Positive for nausea. Negative for vomiting.  Neurological:  Negative for headaches.  Blood pressure (!) 92/58, pulse (!) 56, temperature 98 F (36.7 C), temperature source Oral, resp. rate 16, height 5\' 6"  (1.676 m), weight 74.8 kg, SpO2 100 %. Body mass index is 26.63 kg/m.  Treatment Plan Summary: Daily contact with patient to assess and evaluate symptoms and progress in treatment and Medication management  Safety and Monitoring -- VOLUNTARY admission to inpatient psychiatric unit for safety, stabilization and treatment -- Daily contact with patient to assess and evaluate symptoms and progress in treatment -- Patient's case to be discussed in multi-disciplinary team meeting -- Observation Level : q15 minute checks -- Vital signs:  q12 hours -- Precautions: suicide   MDD recurrent severe without psychosis  She meets criteria for an active episode of MDD without psychosis. She endorses feeling depressed during a 2 month period that she was not using substances. She says that she will only take Wellbutrin for her antidepressant. Pt reports previous seizure at 30 years old after using K-2 and a remote Hx of alcohol abuse. Pt was clearly informed about risk of seizure in the future given this information and wishes to continue. She was offered alternative antidepressants.  -Continue Wellbutrin XL to 300 mg (8/14) -Supportive psychotherapy   Opioid use disorder, severe Patient reports 1 yr Hx of IVDU, mainly fentanyl, and reports symptoms consistent with active w/d -COWS scoring, most recently scored a 0 -Opioid w/d PRNs: Robaxin, Bentyl, Zofran, Imodium -Placement in residential rehab if patient is willing -Supportive psychotherapy and motivational interviewing   Hx Alcohol use disorder -Thiamine and MV -CIWA scoring -Ativan 1 mg q6hrs PRN for CIWA > 10   Continue PRN's: Tylenol, Maalox,  Atarax, Milk of Magnesia, Trazodone   Medical Management Covid negative CMP: AST/ALT 224/335 (see below) CBC: unremarkable, Hgb of 15 EtOH: <10 UDS: opiates Upreg: neg TSH: 0.8 A1C: 6.0 Lipids: unremarkable   Transaminitis in the context of IVDU On admission her AST was elevated at 224 and her ALT at 335. Review of the electronic medical record revealed that her last laboratories that were obtained in January revealed a normal AST at 24 and  normal ALT at 28. In 2020 her liver function enzymes were elevated mildly with an AST of 89 and an ALT of 132. C/f hepatitis C or other acute liver injury given Hx of IVDU. Synthetic liver function intact (INR/PTT/plts), no signs of HE or liver disease on physical exam.  -Hepatitis panel: Hep C Ab reactive, RNA pending -Will need referral to GI as an OP -HIV test: <20  Princess Bruins PGY-1, Psychiatry

## 2020-12-06 NOTE — [facility] Group Notes (Signed)
[facility] Group Notes: (Clinical Social Work)   12/06/2020      Type of Therapy:  Group Therapy   Participation Level:  Did Not Attend - was invited both individually by [facility] and by overhead announcement, chose not to attend.   Ambrose Mantle, LCSW 12/06/2020, 1:11 PM

## 2020-12-06 NOTE — Plan of Care (Signed)
  Problem: Education: Goal: Emotional status will improve Outcome: Not Progressing Goal: Mental status will improve Outcome: Not Progressing   Problem: Activity: Goal: Interest or engagement in activities will improve Outcome: Not Progressing   

## 2020-12-06 NOTE — Progress Notes (Signed)
Pt's mood is less irritable this evening as shift progressed in comparison to this morning and yesterday. Observed in dayroom with peers watching movies, more interactive with others and out of her room. Denies SI, HI, AVH and pain when assessed. Rates her anxiety 8/10 with stressor still being housing "It's more of what I have to deal with outside of here than in here. I still don't know where I'm going when I leave. I rather go to rehab just to have somewhere to stay". Pt remains medication compliant. Denies adverse drug reactions when assessed. Tolerates meals well without complain of nausea this shift. Support and encouragement offered. Safety checks maintained without self harm gestures or outburst. All medications given with verbal education and effects monitored.  Pt is more interactive and cooperative with care this evening. Remains safe in milieu.

## 2020-12-06 NOTE — Progress Notes (Signed)
      12/05/20 2152  Psych Admission Type (Psych Patients Only)  Admission Status Voluntary  Psychosocial Assessment  Patient Complaints Anxiety;Depression  Eye Contact Fair  Facial Expression Anxious;Sad;Worried  Affect Anxious;Depressed  Speech Soft  Interaction Assertive  Motor Activity Slow  Appearance/Hygiene Disheveled  Behavior Characteristics Cooperative;Calm  Mood Anxious;Irritable  Thought Process  Coherency WDL  Content WDL  Delusions WDL  Perception WDL  Hallucination None reported or observed  Judgment Poor  Confusion WDL  Danger to Self  Current suicidal ideation? Denies  Self-Injurious Behavior No self-injurious ideation or behavior indicators observed or expressed   Agreement Not to Harm Self Yes  Description of Agreement verbal contracts for safety  Danger to Others  Danger to Others None reported or observed

## 2020-12-07 LAB — HCV RNA QUANT

## 2020-12-07 NOTE — [facility] Group Notes (Signed)
Occupational Therapy Group Note Date: 12/07/2020 Group Topic/Focus: Sleep Hygiene  Group Description: Group encouraged increased engagement and participation through group discussion focused on Sleep Hygiene. Patients were encouraged to share their own concerns and difficulties with getting a healthy night's rest. Patients were then provided education and resources on habits/routines to maintain healthy sleep hygiene.  Therapeutic Goal(s): Identify and share any current issues with sleep routine and overall sleep hygiene Identify strategies, both environmental and lifestyle, that could help to improve overall sleep quality Participation Level: Active   Participation Quality: Independent   Behavior: Calm and Cooperative   Speech/Thought Process: Focused   Affect/Mood: Euthymic   Insight: Limited   Judgement: Limited   Individualization: Chastidy was active in their participation of group discussion/activity. Pt identified getting "too much" sleep, however shared she does not have a problem with this.   Modes of Intervention: Discussion and Education  Patient Response to Interventions:  Engaged   Plan: Continue to engage patient in OT groups 2 - 3x/week.  12/07/2020  Donne Hazel, MOT, OTR/L

## 2020-12-07 NOTE — Progress Notes (Signed)
[facility] MD Progress Note  12/07/2020 11:08 AM Angela Moran  MRN:  [id number] Subjective:   Angela Moran is a 30 year old female with a PPHx of MDD, anxiety, opioid use disorder, alcohol use disorder, methamphetamine use disorder presenting voluntarily for depression with SI with a plan to overdose.   The patient's chart was reviewed and nursing notes were reviewed. Over the past 24 hrs, there were no documented behavioral issues, no PRN medications given for agitation.  Patient received PRN naproxen, trazodone. The patient's case was discussed in multidisciplinary team meeting.   On interview this morning, patient is found lying in bed. Patient reports feeling "better than yesterday". She states that her mood is improved from yesterday and that she feels less irritable. She says that her withdrawal symptoms have subsided, and reports no nausea, vomiting, shakes, chills, headaches, or dizziness. She states that she would like to pursue outpatient rehab, and has plans to contact [facility] today. She was not able to contact them over the weekend. She states that she is sleeping a sufficient number of hours, but the quality of her sleep is "not great".   Patient denied SI/HI/AVH, delusions, paranoia, first rank symptoms.     Principal Problem: MDD (major depressive disorder), recurrent severe, without psychosis (HCC) Diagnosis: Principal Problem:   MDD (major depressive disorder), recurrent severe, without psychosis (HCC) Active Problems:   Stimulant use disorder   Opiate abuse, continuous (HCC)   Past Psychiatric History: as above  Past Medical History:  Past Medical History:  Diagnosis Date   Depression    doing ok   HSV-2 infection    Infection    UTI   Pregnant    Seizures (HCC) 2014   related to drug use    Vaginal Pap smear, abnormal    clear on repeat    Past Surgical History:  Procedure Laterality Date   CESAREAN SECTION N/A 03/07/2018   Procedure: REPEAT CESAREAN SECTION;   Surgeon: Levie Heritage, DO;  Location: [facility];  Service: Obstetrics;  Laterality: N/A;   NO PAST SURGERIES     Family History:  Family History  Problem Relation Age of Onset   Other Mother        Bone deteriation   Alcohol abuse Mother    Cirrhosis Father    Alcohol abuse Father    Diabetes Brother    Alcohol abuse Maternal Grandmother    Alcohol abuse Maternal Grandfather    Alcohol abuse Paternal Grandmother    Alcohol abuse Paternal Grandfather    Autism Brother    ADD / ADHD Brother    Family Psychiatric  History: depression in "everybody", no suicide, EtOH use disorder in mother and father  Social History: 1.5 ppd smoker for 8 yrs Reports having 1-2 drinks of alcohol per week, but a remote Hx of use disorder   Additional Social History:   Patient is currently without a home Abuses fentanyl (IV) and methamphetamine    Sleep: Fair  Appetite:  Fair  Current Medications: Current Facility-Administered Medications  Medication Dose Route Frequency Provider Last Rate Last Admin   acetaminophen (TYLENOL) tablet 650 mg  650 mg Oral Q6H PRN Jaclyn Shaggy, PA-C   650 mg at 12/05/20 0824   alum & mag hydroxide-simeth (MAALOX/MYLANTA) 200-200-20 MG/5ML suspension 30 mL  30 mL Oral Q4H PRN Melbourne Abts W, PA-C       buPROPion (WELLBUTRIN XL) 24 hr tablet 300 mg  300 mg Oral Daily Carlyn Reichert, MD  300 mg at 12/07/20 0750   dicyclomine (BENTYL) tablet 20 mg  20 mg Oral Q6H PRN Jaclyn Shaggy, PA-C   20 mg at 12/03/20 1042   gabapentin (NEURONTIN) capsule 300 mg  300 mg Oral TID Princess Bruins, DO   300 mg at 12/07/20 0750   hydrOXYzine (ATARAX/VISTARIL) tablet 50 mg  50 mg Oral TID PRN Princess Bruins, DO   50 mg at 12/07/20 0935   loperamide (IMODIUM) capsule 2-4 mg  2-4 mg Oral PRN Melbourne Abts W, PA-C       LORazepam (ATIVAN) tablet 1 mg  1 mg Oral Q6H PRN Carlyn Reichert, MD       magnesium hydroxide (MILK OF MAGNESIA) suspension 30 mL  30 mL Oral Daily PRN  Melbourne Abts W, PA-C       methocarbamol (ROBAXIN) tablet 500 mg  500 mg Oral Q8H PRN Melbourne Abts W, PA-C   500 mg at 12/04/20 0809   multivitamin with minerals tablet 1 tablet  1 tablet Oral Daily Carlyn Reichert, MD   1 tablet at 12/07/20 0750   naproxen (NAPROSYN) tablet 500 mg  500 mg Oral BID PRN Jaclyn Shaggy, PA-C   500 mg at 12/06/20 4481   nicotine (NICODERM CQ - dosed in mg/24 hours) patch 14 mg  14 mg Transdermal Daily Melbourne Abts W, PA-C   14 mg at 12/06/20 0804   ondansetron (ZOFRAN-ODT) disintegrating tablet 4 mg  4 mg Oral Q6H PRN Jaclyn Shaggy, PA-C   4 mg at 12/05/20 0825   pantoprazole (PROTONIX) EC tablet 40 mg  40 mg Oral Daily Antonieta Pert, MD   40 mg at 12/07/20 8563   thiamine tablet 100 mg  100 mg Oral Daily Antonieta Pert, MD   100 mg at 12/07/20 0750   traZODone (DESYREL) tablet 50 mg  50 mg Oral QHS PRN Jaclyn Shaggy, PA-C   50 mg at 12/06/20 2149    Lab Results:  No results found for this or any previous visit (from the past 48 hour(s)).   Blood Alcohol level:  Lab Results  Component Value Date   ETH <10 12/02/2020   ETH 29 (H) 05/17/2020    Metabolic Disorder Labs: Lab Results  Component Value Date   HGBA1C 6.0 (H) 12/03/2020   MPG 125.5 12/03/2020   MPG 96.8 05/18/2020   No results found for: PROLACTIN Lab Results  Component Value Date   CHOL 122 12/03/2020   TRIG 110 12/03/2020   HDL 36 (L) 12/03/2020   CHOLHDL 3.4 12/03/2020   VLDL 22 12/03/2020   LDLCALC 64 12/03/2020   LDLCALC 51 05/18/2020    Physical Findings: AIMS: NA CIWA:  CIWA-Ar Total: 1 COWS:  COWS Total Score: 0  Musculoskeletal: Strength & Muscle Tone: within normal limits Gait & Station:  Patient leans: N/A  Psychiatric Specialty Exam:  Presentation  General Appearance: Disheveled  Eye Contact: Good  Speech:Clear and Coherent; Normal Rate Spontaneous  Speech Volume:Normal  Handedness: Right  Mood and Affect  Mood:Dysphoric; Depressed;  Hopeless Less irritable than prior   Affect:Congruent   Thought Process  Thought Processes:Coherent; Goal Directed; Linear  Descriptions of Associations:Intact  Orientation:Full (Time, Place and Person)  Thought Content: Denies SI/HI/AVH, delusions, first rank symptoms, and paranoia. Patient is not grossly responding to internal/external stimuli on exam and did not make delusional statements.  History of Schizophrenia/Schizoaffective disorder:No  Duration of Psychotic Symptoms: none Hallucinations: none Ideas of Reference:None  Suicidal Thoughts: denies  Homicidal  Thoughts: denies  Sensorium  Memory:Immediate Fair; Recent Fair; Remote Fair  Judgment: Fair Insight:Fair   Executive Functions  Concentration:Good  Attention Span:Good  Recall:Fair  [employer] of Knowledge:Fair  Language:Good   Psychomotor Activity  Psychomotor Activity: Normal  Assets  Assets:Communication Skills; Desire for Improvement; Leisure Time; Resilience   Sleep  Sleep: 6 hrs  Physical Exam: Physical Exam Vitals and nursing note reviewed.  HENT:     Head: Normocephalic and atraumatic.  Pulmonary:     Effort: Pulmonary effort is normal.  Neurological:     General: No focal deficit present.     Mental Status: She is alert and oriented to person, place, and time.   Review of Systems  Respiratory:  Negative for shortness of breath.   Cardiovascular:  Negative for chest pain.  Gastrointestinal:  Negative for nausea and vomiting.  Neurological:  Negative for headaches.  Blood pressure 100/62, pulse 66, temperature 98 F (36.7 C), temperature source Oral, resp. rate 16, height 5\' 6"  (1.676 m), weight 74.8 kg, SpO2 99 %. Body mass index is 26.63 kg/m.  Treatment Plan Summary: Daily contact with patient to assess and evaluate symptoms and progress in treatment and Medication management  Safety and Monitoring -- VOLUNTARY admission to inpatient psychiatric unit for safety,  stabilization and treatment -- Daily contact with patient to assess and evaluate symptoms and progress in treatment -- Patient's case to be discussed in multi-disciplinary team meeting -- Observation Level : q15 minute checks -- Vital signs:  q12 hours -- Precautions: suicide   MDD recurrent severe without psychosis  She meets criteria for an active episode of MDD without psychosis. She endorses feeling depressed during a 2 month period that she was not using substances. She says that she will only take Wellbutrin for her antidepressant. Pt reports previous seizure at 30 years old after using K-2 and a remote Hx of alcohol abuse. Pt was clearly informed about risk of seizure in the future given this information and wishes to continue. She was offered alternative antidepressants.  -Continue Wellbutrin XL to 300 mg (8/14) -Supportive psychotherapy   Opioid use disorder, severe Patient reports 1 yr Hx of IVDU, mainly fentanyl, and reports symptoms consistent with active w/d -COWS scoring, most recently scored a 0 -Opioid w/d PRNs: Robaxin, Bentyl, Zofran, Imodium -Placement in residential rehab if patient is willing -Supportive psychotherapy and motivational interviewing   Hx Alcohol use disorder -Thiamine and MV -CIWA scoring -Ativan 1 mg q6hrs PRN for CIWA > 10   Continue PRN's: Tylenol, Maalox, Atarax, Milk of Magnesia, Trazodone   Medical Management Covid negative CMP: AST/ALT 224/335 (see below) CBC: unremarkable, Hgb of 15 EtOH: <10 UDS: opiates Upreg: neg TSH: 0.8 A1C: 6.0 Lipids: unremarkable   Transaminitis in the context of IVDU On admission her AST was elevated at 224 and her ALT at 335. Review of the electronic medical record revealed that her last laboratories that were obtained in January revealed a normal AST at 24 and normal ALT at 28. In 2020 her liver function enzymes were elevated mildly with an AST of 89 and an ALT of 132. C/f hepatitis C or other acute liver  injury given Hx of IVDU. Synthetic liver function intact (INR/PTT/plts), no signs of HE or liver disease on physical exam.  -Hepatitis panel: Hep C Ab reactive, RNA pending -Will need referral to GI as an OP -HIV test: <20  2021, MS3

## 2020-12-07 NOTE — Progress Notes (Addendum)
   12/07/20 2153  Psych Admission Type (Psych Patients Only)  Admission Status Voluntary  Psychosocial Assessment  Patient Complaints Depression  Eye Contact Fair  Facial Expression Other (Comment) (appropriate)  Affect Appropriate to circumstance  Speech Soft  Interaction Assertive  Motor Activity Slow  Appearance/Hygiene Unremarkable  Behavior Characteristics Cooperative;Calm  Mood Pleasant;Depressed  Thought Process  Coherency WDL  Content WDL  Delusions None reported or observed  Perception WDL  Hallucination None reported or observed  Judgment WDL  Confusion None  Danger to Self  Current suicidal ideation? Denies  Danger to Others  Danger to Others None reported or observed   Pt seen coming out of dayroom. Pt denies SI, HI, AVH and pain. Denies anxiety and rates depression 5/10. States she is trying to get into an [facility] facility in [place]. She is trying to stay with her sister-in-law again until the program starts. "They threw me out before I got here. You know, they were tired of taking care of me. She'll let me know if I can come back." Pt denies any withdrawal symptoms. Pt endorses constipation. LBM was today but was small and pt had to strain to produce it. Pt given MOM. Will continue to monitor. Pt asking about her Gabapentin administrations. Prefers to have her last dose of Gabapentin at bedtime.

## 2020-12-07 NOTE — Progress Notes (Signed)
Spiritual care group on grief and loss facilitated by chaplain Talyia Allende MDiv, BCC  Group Goal:  Support / Education around grief and loss Members engage in facilitated group support and psycho-social education.  Group Description:  Following introductions and group rules, group members engaged in facilitated group dialog and support around topic of loss, with particular support around experiences of loss in their lives. Group Identified types of loss (relationships / self / things) and identified patterns, circumstances, and changes that precipitate losses. Reflected on thoughts / feelings around loss, normalized grief responses, and recognized variety in grief experience.   Group noted Worden's four tasks of grief in discussion.  Group drew on Adlerian / Rogerian, narrative, MI, Patient Progress: Invited to group.  Did not attend.   

## 2020-12-07 NOTE — B[facility] Group Notes (Signed)
B[facility] LCSW Group Therapy  12/07/2020 1:59 PM  Type of Therapy:  Group Therapy: All About Me   Participation Level:  None  Summary of Progress/Problems: Pt came into group, received a worksheet and immediately left.   Angela Moran 12/07/2020, 1:59 PM

## 2020-12-07 NOTE — Progress Notes (Signed)
Pt has been visible in the milieu interacting with peers and staff. Pt complained of being bored with nothing to do and  wanting to be discharged. Pt dennis SI/HI and contracted for sa  12/06/20 2207  Psych Admission Type (Psych Patients Only)  Admission Status Voluntary  Psychosocial Assessment  Patient Complaints Insomnia  Eye Contact Fair  Facial Expression Anxious;Sad;Worried  Affect Anxious;Depressed  Speech Soft  Interaction Assertive  Motor Activity Slow  Appearance/Hygiene Disheveled  Behavior Characteristics Cooperative  Mood Pleasant  Thought Process  Coherency WDL  Content WDL  Delusions WDL;None reported or observed  Perception WDL  Hallucination None reported or observed  Judgment Poor  Confusion WDL  Danger to Self  Current suicidal ideation? Denies  Self-Injurious Behavior No self-injurious ideation or behavior indicators observed or expressed   Agreement Not to Harm Self Yes  Description of Agreement verbal contracts for safety  Danger to Others  Danger to Others None reported or observed  fety, took evening meds and tolerated well, will continue to monitor

## 2020-12-07 NOTE — Progress Notes (Signed)
Psychoeducational Group Note  Date:  12/07/2020 Time:  2129  Group Topic/Focus:  Wrap-Up Group:   The focus of this group is to help patients review their daily goal of treatment and discuss progress on daily workbooks.  Participation Level: Did Not Attend  Participation Quality:  Not Applicable  Affect:  Not Applicable  Cognitive:  Not Applicable  Insight:  Not Applicable  Engagement in Group: Not Applicable  Additional Comments:  The patient did not attend group this evening.   Hazle Coca S 12/07/2020, 9:29 PM

## 2020-12-08 DIAGNOSIS — F332 Major depressive disorder, recurrent severe without psychotic features: Principal | ICD-10-CM

## 2020-12-08 LAB — HEPATIC FUNCTION PANEL
ALT: 308 U/L — ABNORMAL HIGH (ref 0–44)
AST: 180 U/L — ABNORMAL HIGH (ref 15–41)
Albumin: 4.1 g/dL (ref 3.5–5.0)
Alkaline Phosphatase: 57 U/L (ref 38–126)
Bilirubin, Direct: 0.2 mg/dL (ref 0.0–0.2)
Indirect Bilirubin: 0.4 mg/dL (ref 0.3–0.9)
Total Bilirubin: 0.6 mg/dL (ref 0.3–1.2)
Total Protein: 7.8 g/dL (ref 6.5–8.1)

## 2020-12-08 MED ORDER — SENNOSIDES-DOCUSATE SODIUM 8.6-50 MG PO TABS
1.0000 | ORAL_TABLET | Freq: Every day | ORAL | Status: DC
  Administered 2020-12-08 – 2020-12-09 (×2): 1 via ORAL
  Filled 2020-12-08 (×7): qty 1

## 2020-12-08 MED ORDER — NAPROXEN 500 MG PO TABS: 500.0000 mg | ORAL_TABLET | Freq: Two times a day (BID) | ORAL | Status: DC | PRN

## 2020-12-08 MED ORDER — ONDANSETRON 4 MG PO TBDP: 4.0000 mg | ORAL_TABLET | Freq: Four times a day (QID) | ORAL | Status: DC | PRN

## 2020-12-08 MED ORDER — MELATONIN 5 MG PO TABS
5.0000 mg | ORAL_TABLET | Freq: Every day | ORAL | Status: DC
  Administered 2020-12-08: 5 mg via ORAL
  Filled 2020-12-08 (×2): qty 1

## 2020-12-08 MED ORDER — GABAPENTIN 300 MG PO CAPS
300.0000 mg | ORAL_CAPSULE | Freq: Three times a day (TID) | ORAL | Status: DC
  Administered 2020-12-08 – 2020-12-10 (×8): 300 mg via ORAL
  Filled 2020-12-08 (×5): qty 1
  Filled 2020-12-08: qty 21
  Filled 2020-12-08: qty 1
  Filled 2020-12-08: qty 21
  Filled 2020-12-08: qty 1
  Filled 2020-12-08: qty 21
  Filled 2020-12-08 (×3): qty 1

## 2020-12-08 MED ORDER — METHOCARBAMOL 500 MG PO TABS: 500.0000 mg | ORAL_TABLET | Freq: Three times a day (TID) | ORAL | Status: DC | PRN

## 2020-12-08 MED ORDER — DICYCLOMINE HCL 20 MG PO TABS: 20.0000 mg | ORAL_TABLET | Freq: Four times a day (QID) | ORAL | Status: DC | PRN

## 2020-12-08 MED ORDER — LOPERAMIDE HCL 2 MG PO CAPS
2.0000 mg | ORAL_CAPSULE | ORAL | Status: DC | PRN
Start: 2020-12-08 — End: 2020-12-10
  Filled 2020-12-08: qty 1

## 2020-12-08 MED ORDER — ACETAMINOPHEN 500 MG PO TABS: 500.0000 mg | ORAL_TABLET | Freq: Four times a day (QID) | ORAL | Status: DC | PRN

## 2020-12-08 NOTE — BHH Counselor (Signed)
CSW spoke with Marcelino Duster at [facility] who states that the Pt was declined due to NCTracks showing that the Pt has Medicaid out of [facility].  CSW was informed that the Pt would need to get this changed after discharging from [facility] to be accepted at [facility].

## 2020-12-08 NOTE — BHH Suicide Risk Assessment (Signed)
B[facility] INPATIENT:  Family/Significant Other Suicide Prevention Education  Suicide Prevention Education:  Education Completed; Angela Moran [phone] (Sister-in-law) has been identified by the patient as the family member/significant other with whom the patient will be residing, and identified as the person(s) who will aid the patient in the event of a mental health crisis (suicidal ideations/suicide attempt).  With written consent from the patient, the family member/significant other has been provided the following suicide prevention education, prior to the and/or following the discharge of the patient.  The suicide prevention education provided includes the following: Suicide risk factors Suicide prevention and interventions National Suicide Hotline telephone number [facility] assessment telephone number [facility] Emergency Assistance 911 [facility] and/or Residential Mobile Crisis Unit telephone number  Request made of family/significant other to: Remove weapons (e.g., guns, rifles, knives), all items previously/currently identified as safety concern.   Remove drugs/medications (over-the-counter, prescriptions, illicit drugs), all items previously/currently identified as a safety concern.  The family member/significant other verbalizes understanding of the suicide prevention education information provided.  The family member/significant other agrees to remove the items of safety concern listed above.  CSW spoke with Angela Moran who states that Angela Moran has been living with her for the past 3 and a half years and she recently told her that she will need to begin looking for somewhere else to live.  She state that Angela Moran has been using substances for the past 3 years and "has not done her part to get clean".  Angela Moran states that Angela Moran can come to her home for a few days until she can get into a residential facility for her substance use.  Angela Moran  states that Angela Moran has also been depressed more over the past 2 months due to losing her job and car.  Angela Moran state that there are no firearms or weapons in the home. CSW completed SPE with Angela Moran.   Metro Kung Gearline Spilman 12/08/2020, 1:18 PM

## 2020-12-08 NOTE — Progress Notes (Addendum)
Pt is Alert and Oriented to person, place, time and situation. Pt is calm cooperative pleasant denies all withdrawal symptoms denies SI/HI/AVH. Pt c/o being board. Pt is social with peers, focused on discharge. Will continue to monitor pt per Q15 minute face checks and monitor for safety.

## 2020-12-08 NOTE — Progress Notes (Signed)
Patient did attend the evening speaker AA meeting.  

## 2020-12-08 NOTE — Progress Notes (Signed)
Recreation Therapy Notes  Animal-Assisted Activity (AAA) Program Checklist/Progress Notes Patient Eligibility Criteria Checklist & Daily Group note for Rec Tx Intervention  Date: 8.16.22 Time: 1430 Location: 300 Hall Dayroom  AAA/T Program Assumption of Risk Form signed by Patient/ or Parent Legal Guardian YES   Patient is free of allergies or severe asthma  YES   Patient reports no fear of animals  YES   Patient reports no history of cruelty to animals YES   Patient understands his/her participation is voluntary  YES   Patient washes hands before animal contact  YES  Patient washes hands after animal contact  YES  Education: Hand Washing, Appropriate Animal Interaction   Education Outcome: Acknowledges understanding/In group clarification offered/Needs additional education.   Clinical Observations/Feedback: Pt did not attend group session.    Danial Hlavac, LRT/CTRS        Seamus Warehime A 12/08/2020 3:57 PM 

## 2020-12-08 NOTE — BHH Counselor (Signed)
CSW spoke with Alitta at [facility] [phone] ext [phone], who states that there are no beds available for today but states that she will check with the nurse and see when something will be coming available within the next few days.  Jonetta Speak states that she will contact the CSW when there is a bed available.  The Pt has been accepted to [facility] but is waiting on a bed to become available.

## 2020-12-08 NOTE — BHH Group Notes (Signed)
Pt didn't attend group. 

## 2020-12-08 NOTE — Progress Notes (Signed)
   12/08/20 2015  Psych Admission Type (Psych Patients Only)  Admission Status Voluntary  Psychosocial Assessment  Patient Complaints Other (Comment) (pain with digestion when eating)  Eye Contact Fair  Facial Expression Other (Comment) (appropriate)  Affect Appropriate to circumstance  Speech Soft  Interaction Assertive  Motor Activity Slow  Appearance/Hygiene Unremarkable  Behavior Characteristics Cooperative;Anxious  Mood Anxious;Pleasant  Thought Process  Coherency WDL  Content WDL  Delusions None reported or observed  Perception WDL  Hallucination None reported or observed  Judgment WDL  Confusion None  Danger to Self  Current suicidal ideation? Denies  Danger to Others  Danger to Others None reported or observed   Pt denies SI, HI, AVH. Pt denies any withdrawal symptoms. Pt does endorse pain with digestion when eating. "I've been having this problem today. When I eat it feels like the food gets stuck in my chest and I have to drink water to make it move. When it moves it hurts." Pt told to mention this to the provider in the morning. Pt says her sister-in-law refused to let her stay there until she can get a bed at [facility]. "I need some other choices for housing. I will go to [facility], but it's a temporary facility and the food is not good." Pt encouraged to speak to Child psychotherapist.

## 2020-12-08 NOTE — Progress Notes (Addendum)
[facility] MD Progress Note  12/08/2020 10:04 AM Angela Moran  MRN:  [id number] Subjective:   Angela Moran is a 30 year old female with a PPHx of MDD, anxiety, opioid use disorder, alcohol use disorder, methamphetamine use disorder presenting voluntarily for depression with SI with a plan to overdose.   The patient's chart was reviewed and nursing notes were reviewed. Over the past 24 hrs, there were no documented behavioral issues, no PRN medications given for agitation. The patient's case was discussed in multidisciplinary team meeting.   On interview this morning, patient is found walking around room, having showered earlier. She states that she is feeling "less down, but irritable", which she credits to feeling frustrated that she is still at [facility]. She says that her withdrawal symptoms have subsided, and reports no diarrhea, vomiting, shakes, chills, headaches, or dizziness. She has been accepted at [facility], and is waiting on a bed to open up there. She states that she is open to going to [facility] "if there is no where else for me to go." She states that she is going to get in touch with her sister in law today in order to see if she could stay with her while she is waiting on [facility]. Discussed the importance of substance abuse treatment with her, and she is understanding and amenable.   Patient denied SI/HI/AVH, delusions, paranoia, first rank symptoms. She states that her mood is improving, and acknowledges that her medications will take time to take full effect. Also discussed risks of Wellbutrin in substance use patients with her, and she is understanding of seizure risk and would like to continue her current medication regimen.     Principal Problem: MDD (major depressive disorder), recurrent severe, without psychosis (HCC) Diagnosis: Principal Problem:   MDD (major depressive disorder), recurrent severe, without psychosis (HCC) Active Problems:   Stimulant use disorder   Opiate abuse, continuous  (HCC)   Past Psychiatric History: see H&P  Past Medical History:  Past Medical History:  Diagnosis Date   Depression    doing ok   HSV-2 infection    Infection    UTI   Pregnant    Seizures (HCC) 2014   related to drug use    Vaginal Pap smear, abnormal    clear on repeat    Past Surgical History:  Procedure Laterality Date   CESAREAN SECTION N/A 03/07/2018   Procedure: REPEAT CESAREAN SECTION;  Surgeon: Levie Heritage, DO;  Location: [facility];  Service: Obstetrics;  Laterality: N/A;   NO PAST SURGERIES     Family History:  Family History  Problem Relation Age of Onset   Other Mother        Bone deteriation   Alcohol abuse Mother    Cirrhosis Father    Alcohol abuse Father    Diabetes Brother    Alcohol abuse Maternal Grandmother    Alcohol abuse Maternal Grandfather    Alcohol abuse Paternal Grandmother    Alcohol abuse Paternal Grandfather    Autism Brother    ADD / ADHD Brother    Family Psychiatric  History: depression in "everybody", no suicide, EtOH use disorder in mother and father  Social History: 1.5 ppd smoker for 8 yrs Reports having 1-2 drinks of alcohol per week, but a remote Hx of use disorder   Additional Social History:   Patient is currently without a home Abuses fentanyl (IV) and methamphetamine    Sleep: Fair  Appetite:  Fair  Current Medications: Current  Facility-Administered Medications  Medication Dose Route Frequency Provider Last Rate Last Admin   acetaminophen (TYLENOL) tablet 650 mg  650 mg Oral Q6H PRN Jaclyn Shaggy, PA-C   650 mg at 12/05/20 0824   alum & mag hydroxide-simeth (MAALOX/MYLANTA) 200-200-20 MG/5ML suspension 30 mL  30 mL Oral Q4H PRN Melbourne Abts W, PA-C       buPROPion (WELLBUTRIN XL) 24 hr tablet 300 mg  300 mg Oral Daily Carlyn Reichert, MD   300 mg at 12/08/20 0744   dicyclomine (BENTYL) tablet 20 mg  20 mg Oral Q6H PRN Comer Locket, MD       gabapentin (NEURONTIN) capsule 300 mg  300 mg Oral  TID Lauro Franklin, MD   300 mg at 12/08/20 0744   hydrOXYzine (ATARAX/VISTARIL) tablet 50 mg  50 mg Oral TID PRN Princess Bruins, DO   50 mg at 12/08/20 2633   loperamide (IMODIUM) capsule 2-4 mg  2-4 mg Oral PRN Comer Locket, MD       LORazepam (ATIVAN) tablet 1 mg  1 mg Oral Q6H PRN Carlyn Reichert, MD       magnesium hydroxide (MILK OF MAGNESIA) suspension 30 mL  30 mL Oral Daily PRN Melbourne Abts W, PA-C   30 mL at 12/07/20 2148   methocarbamol (ROBAXIN) tablet 500 mg  500 mg Oral Q8H PRN Comer Locket, MD       multivitamin with minerals tablet 1 tablet  1 tablet Oral Daily Carlyn Reichert, MD   1 tablet at 12/08/20 0744   naproxen (NAPROSYN) tablet 500 mg  500 mg Oral BID PRN Comer Locket, MD       nicotine (NICODERM CQ - dosed in mg/24 hours) patch 14 mg  14 mg Transdermal Daily Melbourne Abts W, PA-C   14 mg at 12/08/20 0903   ondansetron (ZOFRAN-ODT) disintegrating tablet 4 mg  4 mg Oral Q6H PRN Comer Locket, MD       pantoprazole (PROTONIX) EC tablet 40 mg  40 mg Oral Daily Antonieta Pert, MD   40 mg at 12/08/20 0744   thiamine tablet 100 mg  100 mg Oral Daily Antonieta Pert, MD   100 mg at 12/08/20 0744   traZODone (DESYREL) tablet 50 mg  50 mg Oral QHS PRN Jaclyn Shaggy, PA-C   50 mg at 12/07/20 2148    Lab Results:  No results found for this or any previous visit (from the past 48 hour(s)).   Blood Alcohol level:  Lab Results  Component Value Date   ETH <10 12/02/2020   ETH 29 (H) 05/17/2020    Metabolic Disorder Labs: Lab Results  Component Value Date   HGBA1C 6.0 (H) 12/03/2020   MPG 125.5 12/03/2020   MPG 96.8 05/18/2020   No results found for: PROLACTIN Lab Results  Component Value Date   CHOL 122 12/03/2020   TRIG 110 12/03/2020   HDL 36 (L) 12/03/2020   CHOLHDL 3.4 12/03/2020   VLDL 22 12/03/2020   LDLCALC 64 12/03/2020   LDLCALC 51 05/18/2020    Physical Findings: AIMS: NA CIWA:  CIWA-Ar Total: 2 COWS:  COWS Total Score:  0  Musculoskeletal: Strength & Muscle Tone: within normal limits Gait & Station: steady, normal Patient leans: N/A  Psychiatric Specialty Exam:  Presentation  General Appearance: Fair grooming, casually dressed  Eye Contact: Fair  Speech:Clear and Coherent; Normal Rate Spontaneous  Speech Volume:Normal  Handedness: Right  Mood and Affect  Mood:Dysphoric,  mildly irritable   Affect:Congruent   Thought Process  Thought Processes:Coherent; Goal Directed; Linear  Descriptions of Associations:Intact  Orientation:Full (Time, Place and Person)  Thought Content: Denies SI/HI/AVH, delusions, first rank symptoms, and paranoia. Patient is not grossly responding to internal/external stimuli on exam and did not make delusional statements.  History of Schizophrenia/Schizoaffective disorder:No  Duration of Psychotic Symptoms: none Hallucinations: none Ideas of Reference:None  Suicidal Thoughts: denies  Homicidal Thoughts: denies  Sensorium  Memory:Immediate Fair; Recent Fair; Remote Fair  Judgment: Fair Insight:Fair   Executive Functions  Concentration:Good  Attention Span:Good  Recall:Fair  Fund of Knowledge:Fair  Language:Good   Psychomotor Activity  Psychomotor Activity: Normal  Assets  Assets:Communication Skills; Desire for Improvement; Leisure Time; Resilience   Sleep  Sleep: 6 hrs  Physical Exam: Physical Exam Vitals and nursing note reviewed.  HENT:     Head: Normocephalic and atraumatic.  Pulmonary:     Effort: Pulmonary effort is normal.  Neurological:     General: No focal deficit present.     Mental Status: She is alert and oriented to person, place, and time.   Review of Systems  Respiratory:  Negative for shortness of breath.   Cardiovascular:  Negative for chest pain.  Gastrointestinal:  Positive for constipation and nausea. Negative for vomiting.  Neurological:  Negative for headaches.  Blood pressure 104/65, pulse 65,  temperature 97.9 F (36.6 C), temperature source Oral, resp. rate 18, height 5\' 6"  (1.676 m), weight 74.8 kg, SpO2 100 %. Body mass index is 26.63 kg/m.  Treatment Plan Summary: Daily contact with patient to assess and evaluate symptoms and progress in treatment and Medication management  Safety and Monitoring -- VOLUNTARY admission to inpatient psychiatric unit for safety, stabilization and treatment -- Daily contact with patient to assess and evaluate symptoms and progress in treatment -- Patient's case to be discussed in multi-disciplinary team meeting -- Observation Level : q15 minute checks -- Vital signs:  q12 hours -- Precautions: suicide   MDD recurrent severe without psychosis (r/o substance induced depressive d/o) She meets criteria for an active episode of MDD without psychosis. She endorses feeling depressed during a 2 month period that she was not using substances. She only agrees to use of Wellbutrin and will not consider other antidepressant options. Extensive time was spent by the attending psychiatrist discussing the risk of reduced seizure threshold with use of Wellbutrin and her inherent risk of seizures in the context of her substance use. She reportedly has a h/o a seizure 10 years ago in the context of K-2 and alcohol abuse and verbalized understanding of risks of recurrence of seizures given her history, her substance use and her use of Wellbutrin. Despite risks, she wishes to continue present dose of Wellbutrin and would not entertain other antidepressant options. -Continue Wellbutrin XL to 300 mg (8/14) -Supportive psychotherapy - Continue Neurontin 300mg  tid for anxiety, potential cravings, and to hopefully reduce risk of withdrawal seizures    Opioid use disorder, severe Patient reports 1 yr Hx of IVDU, mainly fentanyl - Continue gabapentin 300 mg TID to help with cravings, anxiety, and potentially to reduce risk of withdrawal seizures -COWS scoring, most recently  scored a 0 -Opioid w/d PRNs: Robaxin, Bentyl, Zofran, Imodium -Accepted to [facility] -Supportive psychotherapy and motivational interviewing   Hx Alcohol use disorder -Thiamine and MVI -CIWA scoring -Ativan 1 mg q6hrs PRN for CIWA > 10   Constipation - Start Senna+ docusate daily  Continue PRN's: Tylenol, , Atarax, Switched trazodone to melatonin  5mg  qhs 2/2 nightmares   Medical Management Covid negative CMP: AST/ALT 224/335 (see below) CBC: unremarkable, Hgb of 15 EtOH: <10 UDS: opiates Upreg: neg TSH: 0.8 A1C: 6.0 Lipids: unremarkable   Transaminitis in the context of IVDU On admission her AST was elevated at 224 and her ALT at 335. Review of the electronic medical record revealed that her last laboratories that were obtained in January revealed a normal AST at 24 and normal ALT at 28. In 2020 her liver function enzymes were elevated mildly with an AST of 89 and an ALT of 132.  -Hepatitis panel: Hep C Ab reactive, RNA panel did not result due to problem with sample. Re-drew today along with repeat hepatic function panel for trending of LFTS. -Will need referral to GI as an OP -HIV test: <20  Filomena JunglingHannah Vrooman, MS3  Attestation for Student Documentation:   I personally was present and performed or re-performed the history, physical exam and medical decision-making activities of this service and have verified that the service and findings are accurately documented in the student's note.  Bartholomew CrewsAmy Jaena Brocato, MD, Celene SkeenFAPA

## 2020-12-08 NOTE — Progress Notes (Signed)
Pt seen at nurse's station. Pt c/o feeling jittery and needing something to calm her down. Pt given Vistaril 50 mg.

## 2020-12-09 LAB — LIPASE, BLOOD: Lipase: 35 U/L (ref 11–51)

## 2020-12-09 LAB — AMYLASE: Amylase: 34 U/L (ref 28–100)

## 2020-12-09 LAB — PROTIME-INR
INR: 1 (ref 0.8–1.2)
Prothrombin Time: 13.1 seconds (ref 11.4–15.2)

## 2020-12-09 MED ORDER — PANTOPRAZOLE SODIUM 40 MG PO TBEC
40.0000 mg | DELAYED_RELEASE_TABLET | Freq: Every day | ORAL | Status: DC
  Administered 2020-12-09 – 2020-12-10 (×2): 40 mg via ORAL
  Filled 2020-12-09: qty 7
  Filled 2020-12-09 (×3): qty 1

## 2020-12-09 MED ORDER — MIRTAZAPINE 7.5 MG PO TABS
7.5000 mg | ORAL_TABLET | Freq: Every day | ORAL | Status: DC
  Filled 2020-12-09: qty 1

## 2020-12-09 MED ORDER — MELATONIN 5 MG PO TABS
10.0000 mg | ORAL_TABLET | Freq: Every day | ORAL | Status: DC
  Administered 2020-12-09: 10 mg via ORAL
  Filled 2020-12-09 (×3): qty 2

## 2020-12-09 NOTE — Progress Notes (Signed)
   12/09/20 1800  Psych Admission Type (Psych Patients Only)  Admission Status Voluntary  Psychosocial Assessment  Patient Complaints Anxiety  Eye Contact Fair  Facial Expression Anxious (appropriate)  Affect Appropriate to circumstance  Speech Soft  Interaction Assertive  Motor Activity Slow  Appearance/Hygiene Unremarkable  Behavior Characteristics Cooperative  Mood Anxious  Thought Process  Coherency WDL  Content WDL  Delusions None reported or observed  Perception WDL  Hallucination None reported or observed  Judgment WDL  Confusion None  Danger to Self  Current suicidal ideation? Denies  Danger to Others  Danger to Others None reported or observed

## 2020-12-09 NOTE — Plan of Care (Addendum)
On re-exam, patient states that her nausea has resolved but she does not like the food here. She reports some mild diarrhea this morning that has improved. On physical exam her abdomen is soft, no rebound, no guarding, no focal tenderness, no distension or masses. She has no evidence of jaundice on exam. Given her elevated LFTs and c/o mid-epigastric pain will order PT/INR and will check amylase/lipase. She declines abdominal ultrasound and was advised that it is imperative that she see GI or PCP after discharge for trending of LFTs and f/u of Hep C positive Ab. She was counseled on safe sex and blood bourne precautions given Hep C Ab positive status.  Bartholomew Crews, MD, Celene Skeen

## 2020-12-09 NOTE — [facility] Group Notes (Signed)
Type of Therapy and Topic:  Group Therapy:  Self-Esteem   Participation Level:  Did not attend   Description of Group: This group addressed positive self-esteem. Patients were given a worksheet with a blank shield. Patients were asked what a shield is and when it is used. Patients were asked to list, draw, or write protective factors in the their lives on their shields. Patients discussed the words, ideas and drawings that they put on their shield. Patients were encouraged to have a daily reflection of positive characteristics/ protective factors.  Therapeutic Goals Patient will verbalize two of their positive qualities Patient will demonstrate insight but naming social supports in their lives Patient will verbalize their feelings when voicing positive self affirmations and when voicing positive affirmations of others Patients will discuss the potential positive impact on their wellness/recovery of focusing on positive traits of self and others.  Summary of Patient Progress:    Did not attend  

## 2020-12-09 NOTE — Tx Team (Signed)
Interdisciplinary Treatment and Diagnostic Plan Update  12/09/2020 Time of Session: 9:05am Angela Moran MRN: [id number]  Principal Diagnosis: MDD (major depressive disorder), recurrent severe, without psychosis (HCC)  Secondary Diagnoses: Principal Problem:   MDD (major depressive disorder), recurrent severe, without psychosis (HCC) Active Problems:   Stimulant use disorder   Opiate abuse, continuous (HCC)   Current Medications:  Current Facility-Administered Medications  Medication Dose Route Frequency Provider Last Rate Last Admin   acetaminophen (TYLENOL) tablet 500 mg  500 mg Oral Q6H PRN Comer Locket, MD       alum & mag hydroxide-simeth (MAALOX/MYLANTA) 200-200-20 MG/5ML suspension 30 mL  30 mL Oral Q4H PRN [name], Cody W, PA-C       buPROPion (WELLBUTRIN XL) 24 hr tablet 300 mg  300 mg Oral Daily Carlyn Reichert, MD   300 mg at 12/09/20 8469   dicyclomine (BENTYL) tablet 20 mg  20 mg Oral Q6H PRN Comer Locket, MD       gabapentin (NEURONTIN) capsule 300 mg  300 mg Oral TID Lauro Franklin, MD   300 mg at 12/09/20 1340   hydrOXYzine (ATARAX/VISTARIL) tablet 50 mg  50 mg Oral TID PRN Princess Bruins, DO   50 mg at 12/09/20 0912   loperamide (IMODIUM) capsule 2-4 mg  2-4 mg Oral PRN Comer Locket, MD       LORazepam (ATIVAN) tablet 1 mg  1 mg Oral Q6H PRN Carlyn Reichert, MD       magnesium hydroxide (MILK OF MAGNESIA) suspension 30 mL  30 mL Oral Daily PRN Melbourne Abts W, PA-C   30 mL at 12/07/20 2148   melatonin tablet 10 mg  10 mg Oral QHS Comer Locket, MD       methocarbamol (ROBAXIN) tablet 500 mg  500 mg Oral Q8H PRN Comer Locket, MD       multivitamin with minerals tablet 1 tablet  1 tablet Oral Daily Carlyn Reichert, MD   1 tablet at 12/09/20 0806   naproxen (NAPROSYN) tablet 500 mg  500 mg Oral BID PRN Comer Locket, MD       nicotine (NICODERM CQ - dosed in mg/24 hours) patch 14 mg  14 mg Transdermal Daily Melbourne Abts W, PA-C   14 mg at  12/08/20 0903   ondansetron (ZOFRAN-ODT) disintegrating tablet 4 mg  4 mg Oral Q6H PRN Comer Locket, MD       pantoprazole (PROTONIX) EC tablet 40 mg  40 mg Oral Daily Mason Jim, Amy E, MD   40 mg at 12/09/20 1300   senna-docusate (Senokot-S) tablet 1 tablet  1 tablet Oral [id number] Comer Locket, MD   1 tablet at 12/09/20 8413   thiamine tablet 100 mg  100 mg Oral Daily Antonieta Pert, MD   100 mg at 12/09/20 2440   PTA Medications: Medications Prior to Admission  Medication Sig Dispense Refill Last Dose   ibuprofen (ADVIL) 400 MG tablet Take 400 mg by mouth every 6 (six) hours as needed for mild pain or headache.      buPROPion (WELLBUTRIN XL) 150 MG 24 hr tablet Take 1 tablet (150 mg total) by mouth daily. For depression (Patient not taking: No sig reported) 30 tablet 0 Not Taking   doxycycline (VIBRA-TABS) 100 MG tablet Take 1 tablet (100 mg total) by mouth every 12 (twelve) hours. For wound infection (Patient not taking: No sig reported)   Completed Course   hydrOXYzine (ATARAX/VISTARIL) 25 MG tablet Take  1 tablet (25 mg total) by mouth 3 (three) times daily as needed for anxiety. (Patient not taking: No sig reported) 75 tablet 0 Not Taking   mupirocin cream (BACTROBAN) 2 % Apply topically 2 (two) times daily. For wound care (Patient not taking: No sig reported) 15 g 0 Completed Course   neomycin-bacitracin-polymyxin (NEOSPORIN) OINT Apply 1 application topically 3 (three) times daily. For wound care (Patient not taking: No sig reported)   Not Taking   nicotine (NICODERM CQ - DOSED IN MG/24 HOURS) 21 mg/24hr patch Place 1 patch (21 mg total) onto the skin daily. (May buy from over the counter): For smoking cessation (Patient not taking: No sig reported) 28 patch 0 Not Taking   traZODone (DESYREL) 50 MG tablet Take 1 tablet (50 mg total) by mouth at bedtime as needed for sleep. (Patient not taking: No sig reported) 30 tablet 0 Not Taking    Patient Stressors: Marital or family  conflict Medication change or noncompliance  Patient Strengths: General fund of knowledge Motivation for treatment/growth  Treatment Modalities: Medication Management, Group therapy, Case management,  1 to 1 session with clinician, Psychoeducation, Recreational therapy.   Physician Treatment Plan for Primary Diagnosis: MDD (major depressive disorder), recurrent severe, without psychosis (HCC) Long Term Goal(s): Improvement in symptoms so as ready for discharge   Short Term Goals: Ability to demonstrate self-control will improve Ability to identify and develop effective coping behaviors will improve Ability to disclose and discuss suicidal ideas Compliance with prescribed medications will improve  Medication Management: Evaluate patient's response, side effects, and tolerance of medication regimen.  Therapeutic Interventions: 1 to 1 sessions, Unit Group sessions and Medication administration.  Evaluation of Outcomes: Progressing  Physician Treatment Plan for Secondary Diagnosis: Principal Problem:   MDD (major depressive disorder), recurrent severe, without psychosis (HCC) Active Problems:   Stimulant use disorder   Opiate abuse, continuous (HCC)  Long Term Goal(s): Improvement in symptoms so as ready for discharge   Short Term Goals: Ability to demonstrate self-control will improve Ability to identify and develop effective coping behaviors will improve Ability to disclose and discuss suicidal ideas Compliance with prescribed medications will improve     Medication Management: Evaluate patient's response, side effects, and tolerance of medication regimen.  Therapeutic Interventions: 1 to 1 sessions, Unit Group sessions and Medication administration.  Evaluation of Outcomes: Progressing   RN Treatment Plan for Primary Diagnosis: MDD (major depressive disorder), recurrent severe, without psychosis (HCC) Long Term Goal(s): Knowledge of disease and therapeutic regimen to  maintain health will improve  Short Term Goals: Ability to participate in decision making will improve, Ability to identify and develop effective coping behaviors will improve, and Compliance with prescribed medications will improve  Medication Management: RN will administer medications as ordered by provider, will assess and evaluate patient's response and provide education to patient for prescribed medication. RN will report any adverse and/or side effects to prescribing provider.  Therapeutic Interventions: 1 on 1 counseling sessions, Psychoeducation, Medication administration, Evaluate responses to treatment, Monitor vital signs and CBGs as ordered, Perform/monitor CIWA, COWS, AIMS and Fall Risk screenings as ordered, Perform wound care treatments as ordered.  Evaluation of Outcomes: Progressing   LCSW Treatment Plan for Primary Diagnosis: MDD (major depressive disorder), recurrent severe, without psychosis (HCC) Long Term Goal(s): Safe transition to appropriate next level of care at discharge, Engage patient in therapeutic group addressing interpersonal concerns.  Short Term Goals: Engage patient in aftercare planning with referrals and resources, Increase emotional regulation, and Facilitate patient  progression through stages of change regarding substance use diagnoses and concerns  Therapeutic Interventions: Assess for all discharge needs, 1 to 1 time with Child psychotherapist, Explore available resources and support systems, Assess for adequacy in community support network, Educate family and significant other(s) on suicide prevention, Complete Psychosocial Assessment, Interpersonal group therapy.  Evaluation of Outcomes: Progressing   Progress in Treatment: Attending groups: No. Participating in groups: No. Taking medication as prescribed: Yes. Toleration medication: Yes. Family/Significant other contact made: Yes, individual(s) contacted:  Sister-in-law Patient understands diagnosis:  Yes. Discussing patient identified problems/goals with staff: Yes. Medical problems stabilized or resolved: Yes. Denies suicidal/homicidal ideation: Yes. Issues/concerns per patient self-inventory: No. Other: None  New problem(s) identified: No, Describe:  None  New Short Term/Long Term Goal(s):medication stabilization, elimination of SI thoughts, development of comprehensive mental wellness plan.   Patient Goals:  "get into a program"  Discharge Plan or Barriers: Patient recently admitted. CSW will continue to follow and assess for appropriate referrals and possible discharge planning.   Reason for Continuation of Hospitalization: Depression Medication stabilization Suicidal ideation Withdrawal symptoms  Estimated Length of Stay: 3-5 days  Attendees: Patient: Angela Moran 12/09/20  Physician: Rhea Belton, DO 12/09/20  Nursing:  12/09/20  RN Care Manager: 12/09/20  Social Worker: Melba Coon, LCSWA 12/09/20  Recreational Therapist:  12/09/20  Other:  12/09/20  Other:  12/09/20  Other: 12/09/20    Scribe for Treatment Team: Aram Beecham, LCSWA 12/09/2020 2:31 PM

## 2020-12-09 NOTE — Progress Notes (Addendum)
[facility] MD Progress Note  12/09/2020 9:35 AM Angela Moran  MRN:  [id number] Subjective:   Angela Moran is a 30 year old female with a PPHx of MDD, anxiety, opioid use disorder, alcohol use disorder, methamphetamine use disorder presenting voluntarily for depression with SI with a plan to overdose.   The patient's chart was reviewed and nursing notes were reviewed. Over the past 24 hrs, there were no documented behavioral issues, no PRN medications given for agitation. The patient's case was discussed in multidisciplinary team meeting.   On interview this morning, patient is found walking in the halls and later in her bed for the interview. She states that she is feeling "ill" and that she "does not want to be here anymore" in reference to her stay at [facility]. She reports a midepigastric "sensation" with eating solid foods and drinking liquids. She describes feeling like her food is occasionally "getting stuck" and is releaved with drinking liquids. We discussed need to see GI or PCP after discharge and she was in agreement for PPI for GI protection. She has been accepted at [facility], and is waiting on a bed to open up there.She states "I don't want to go to a rehab place that is as bad as this place", but recognizes that [facility] may be her only option. She desires staying with her sister in law until a bed is open at [facility].   Patient denied SI/HI/AVH, delusions, paranoia, first rank symptoms. She states that her mood is "ill".  Also discussed risks of Wellbutrin in substance use patients with her, and she is understanding of seizure risk and would like to continue her current medication regimen. She continues to decline option of any change in antidepressant regimen. She states that she slept poorly with the melatonin last night and would like a new medication to help her sleep tonight. We discussed option of Remeron which she later declines. She was encouraged to try higher dose of Melatonin for sleep.  Principal  Problem: MDD (major depressive disorder), recurrent severe, without psychosis (HCC) Diagnosis: Principal Problem:   MDD (major depressive disorder), recurrent severe, without psychosis (HCC) Active Problems:   Stimulant use disorder   Opiate abuse, continuous (HCC)  Past Psychiatric History: see H&P  Past Medical History:  Past Medical History:  Diagnosis Date   Depression    doing ok   HSV-2 infection    Infection    UTI   Pregnant    Seizures (HCC) 2014   related to drug use    Vaginal Pap smear, abnormal    clear on repeat    Past Surgical History:  Procedure Laterality Date   CESAREAN SECTION N/A 03/07/2018   Procedure: REPEAT CESAREAN SECTION;  Surgeon: Levie Heritage, DO;  Location: [facility];  Service: Obstetrics;  Laterality: N/A;   NO PAST SURGERIES     Family History:  Family History  Problem Relation Age of Onset   Other Mother        Bone deteriation   Alcohol abuse Mother    Cirrhosis Father    Alcohol abuse Father    Diabetes Brother    Alcohol abuse Maternal Grandmother    Alcohol abuse Maternal Grandfather    Alcohol abuse Paternal Grandmother    Alcohol abuse Paternal Grandfather    Autism Brother    ADD / ADHD Brother    Family Psychiatric  History: depression in "everybody", no suicide, EtOH use disorder in mother and father  Social History: 1.5 ppd  smoker for 8 yrs Reports having 1-2 drinks of alcohol per week, but a remote Hx of use disorder   Additional Social History:   Patient is currently without a home Abuses fentanyl (IV) and methamphetamine    Sleep: Fair  Appetite:  Fair  Current Medications: Current Facility-Administered Medications  Medication Dose Route Frequency Provider Last Rate Last Admin   acetaminophen (TYLENOL) tablet 500 mg  500 mg Oral Q6H PRN Comer Locket, MD       alum & mag hydroxide-simeth (MAALOX/MYLANTA) 200-200-20 MG/5ML suspension 30 mL  30 mL Oral Q4H PRN [name], Cody W, PA-C        buPROPion (WELLBUTRIN XL) 24 hr tablet 300 mg  300 mg Oral Daily Carlyn Reichert, MD   300 mg at 12/09/20 0806   dicyclomine (BENTYL) tablet 20 mg  20 mg Oral Q6H PRN Comer Locket, MD       gabapentin (NEURONTIN) capsule 300 mg  300 mg Oral TID Lauro Franklin, MD   300 mg at 12/09/20 6789   hydrOXYzine (ATARAX/VISTARIL) tablet 50 mg  50 mg Oral TID PRN Princess Bruins, DO   50 mg at 12/09/20 3810   loperamide (IMODIUM) capsule 2-4 mg  2-4 mg Oral PRN Comer Locket, MD       LORazepam (ATIVAN) tablet 1 mg  1 mg Oral Q6H PRN Carlyn Reichert, MD       magnesium hydroxide (MILK OF MAGNESIA) suspension 30 mL  30 mL Oral Daily PRN Melbourne Abts W, PA-C   30 mL at 12/07/20 2148   melatonin tablet 5 mg  5 mg Oral QHS Bartholomew Crews E, MD   5 mg at 12/08/20 2140   methocarbamol (ROBAXIN) tablet 500 mg  500 mg Oral Q8H PRN Comer Locket, MD       multivitamin with minerals tablet 1 tablet  1 tablet Oral Daily Carlyn Reichert, MD   1 tablet at 12/09/20 0806   naproxen (NAPROSYN) tablet 500 mg  500 mg Oral BID PRN Comer Locket, MD       nicotine (NICODERM CQ - dosed in mg/24 hours) patch 14 mg  14 mg Transdermal Daily Melbourne Abts W, PA-C   14 mg at 12/08/20 0903   ondansetron (ZOFRAN-ODT) disintegrating tablet 4 mg  4 mg Oral Q6H PRN Comer Locket, MD       pantoprazole (PROTONIX) EC tablet 40 mg  40 mg Oral Daily Antonieta Pert, MD   40 mg at 12/09/20 0806   senna-docusate (Senokot-S) tablet 1 tablet  1 tablet Oral Q0600 Comer Locket, MD   1 tablet at 12/09/20 1751   thiamine tablet 100 mg  100 mg Oral Daily Antonieta Pert, MD   100 mg at 12/09/20 0258    Lab Results:  Results for orders placed or performed during the hospital encounter of 12/03/20 (from the past 48 hour(s))  Hepatic function panel     Status: Abnormal   Collection Time: 12/08/20  6:16 PM  Result Value Ref Range   Total Protein 7.8 6.5 - 8.1 g/dL   Albumin 4.1 3.5 - 5.0 g/dL   AST 527 (H) 15 - 41 U/L    ALT 308 (H) 0 - 44 U/L   Alkaline Phosphatase 57 38 - 126 U/L   Total Bilirubin 0.6 0.3 - 1.2 mg/dL   Bilirubin, Direct 0.2 0.0 - 0.2 mg/dL   Indirect Bilirubin 0.4 0.3 - 0.9 mg/dL    Comment: Performed at  [facility], [address] [place], [place] [address]     Blood Alcohol level:  Lab Results  Component Value Date   ETH <10 12/02/2020   ETH 29 (H) 05/17/2020    Metabolic Disorder Labs: Lab Results  Component Value Date   HGBA1C 6.0 (H) 12/03/2020   MPG 125.5 12/03/2020   MPG 96.8 05/18/2020   No results found for: PROLACTIN Lab Results  Component Value Date   CHOL 122 12/03/2020   TRIG 110 12/03/2020   HDL 36 (L) 12/03/2020   CHOLHDL 3.4 12/03/2020   VLDL 22 12/03/2020   LDLCALC 64 12/03/2020   LDLCALC 51 05/18/2020    Physical Findings: AIMS: NA CIWA:  CIWA-Ar Total: 1 COWS:  COWS Total Score: 1  Musculoskeletal: Strength & Muscle Tone: within normal limits Gait & Station: steady, normal Patient leans: N/A  Psychiatric Specialty Exam:  Presentation  General Appearance: Fair grooming, casually dressed  Eye Contact: Fair  Speech:Clear and Coherent; Normal Rate Spontaneous  Speech Volume:Normal  Handedness: Right  Mood and Affect  Mood:Dysphoric, mildly irritable   Affect:Congruent   Thought Process  Thought Processes:Coherent; Goal Directed; Linear  Descriptions of Associations:Intact  Orientation:Full (Time, Place and Person)  Thought Content: Denies SI/HI/AVH, delusions, first rank symptoms, and paranoia. Patient is not grossly responding to internal/external stimuli on exam and did not make delusional statements.  History of Schizophrenia/Schizoaffective disorder:No  Duration of Psychotic Symptoms: none Hallucinations: none Ideas of Reference:None  Suicidal Thoughts: denies  Homicidal Thoughts: denies  Sensorium  Memory:Immediate Fair; Recent Fair; Remote Fair  Judgment:  Fair Insight:Fair   Executive Functions  Concentration:Good  Attention Span:Good  Recall:Fair  Fund of Knowledge:Fair  Language:Good   Psychomotor Activity  Psychomotor Activity: Normal  Assets  Assets:Communication Skills; Desire for Improvement; Leisure Time; Resilience   Sleep  Sleep: 6 hrs  Physical Exam: Physical Exam Vitals and nursing note reviewed.  HENT:     Head: Normocephalic and atraumatic.  Pulmonary:     Effort: Pulmonary effort is normal.  Neurological:     General: No focal deficit present.     Mental Status: She is alert and oriented to person, place, and time.    Blood pressure 114/66, pulse 74, temperature 98.1 F (36.7 C), temperature source Oral, resp. rate 18, height 5\' 6"  (1.676 m), weight 74.8 kg, SpO2 100 %. Body mass index is 26.63 kg/m.  Treatment Plan Summary: Daily contact with patient to assess and evaluate symptoms and progress in treatment and Medication management  Safety and Monitoring -- VOLUNTARY admission to inpatient psychiatric unit for safety, stabilization and treatment -- Daily contact with patient to assess and evaluate symptoms and progress in treatment -- Patient's case to be discussed in multi-disciplinary team meeting -- Observation Level : q15 minute checks -- Vital signs:  q12 hours -- Precautions: suicide   MDD recurrent severe without psychosis (r/o substance induced depressive d/o) She meets criteria for an active episode of MDD without psychosis. She endorses feeling depressed during a 2 month period that she was not using substances. She only agrees to use of Wellbutrin and will not consider other antidepressant options. Extensive time was spent by the attending psychiatrist discussing the risk of reduced seizure threshold with use of Wellbutrin and her inherent risk of seizures in the context of her substance use. She reportedly has a h/o a seizure 10 years ago in the context of K-2 and alcohol abuse and  verbalized understanding of risks of recurrence of seizures given  her history, her substance use and her use of Wellbutrin. Despite risks, she wishes to continue present dose of Wellbutrin. Was counseled on other antidepressant options, but she declined medication change. -Continue Wellbutrin XL to 300 mg (8/14) -Supportive psychotherapy - Continue Neurontin 300mg  tid for anxiety, potential cravings, and to hopefully reduce risk of withdrawal seizures    Opioid use disorder, severe Patient reports 1 yr Hx of IVDU, mainly fentanyl - Continue gabapentin 300 mg TID to help with cravings, anxiety, and potentially to reduce risk of withdrawal seizures -COWS scoring, most recently scored a 0 -Opioid w/d PRNs: Robaxin, Bentyl, Zofran, Imodium -Accepted to [facility] -Supportive psychotherapy and motivational interviewing   Hx Alcohol use disorder -Thiamine and MVI -CIWA scoring -Ativan 1 mg q6hrs PRN for CIWA > 10 -Added Protonix for her epigastric discomfort.    Constipation - continue Senna+ docusate daily  Continue PRN's: Tylenol, , Atarax, Upped dosage of melatonin to 10mg  for insomnia.   Medical Management Covid negative CMP: AST/ALT 180/308 down from 224/335 CBC: unremarkable, Hgb of 15 EtOH: <10 UDS: opiates Upreg: neg TSH: 0.8 A1C: 6.0 Lipids: unremarkable   Transaminitis in the context of IVDU On admission her AST was elevated at 224 and her ALT at 335. AST/ALT down to 180/308 on 12/09/20. Review of the electronic medical record revealed that her last laboratories that were obtained in January revealed a normal AST at 24 and normal ALT at 28. In 2020 her liver function enzymes were elevated mildly with an AST of 89 and an ALT of 132.  -Hepatitis panel: Hep C Ab reactive and Hep C RNA quant pending. -Will need referral to GI as an OP -HIV test: <20  Dispo:  She can be discharged tomorrow from a psychiatric & medical standpoint if she has a safe place to go, which will likely be  [facility] sister in [facility].   Filomena JunglingHannah Vrooman, MS3

## 2020-12-09 NOTE — Progress Notes (Signed)
Recreation Therapy Notes  Date:  8.17.22 Time: 0930 Location: [address] Dayroom  Group Topic: Stress Management  Goal Area(s) Addresses:  Patient will identify positive stress management techniques. Patient will identify benefits of using stress management post d/c.  Intervention: Stress Management  Activity:  Meditation.  LRT played a meditation that focused on calming anxiety.  Meditation spoke of acknowledging your feelings but not letting them take over your thoughts and emotions.  Education:  Stress Management, Discharge Planning.   Education Outcome: Acknowledges Education  Clinical Observations/Feedback: Pt did not attend group session.    Angela Moran Drema Halon A 12/09/2020 12:07 PM

## 2020-12-09 NOTE — BHH Counselor (Signed)
CSW contacted [facility] to find out if there was an available bed for the Pt.  CSW was informed that there will be no available beds for today and that the Pt should call again tomorrow and each day after to continue to check on bed availability.

## 2020-12-10 MED ORDER — NICOTINE 14 MG/24HR TD PT24: 14.0000 mg | MEDICATED_PATCH | Freq: Every day | TRANSDERMAL | 0 refills | Status: AC

## 2020-12-10 MED ORDER — GABAPENTIN 300 MG PO CAPS: 300.0000 mg | ORAL_CAPSULE | Freq: Three times a day (TID) | ORAL | 0 refills | Status: AC

## 2020-12-10 MED ORDER — PANTOPRAZOLE SODIUM 40 MG PO TBEC: 40.0000 mg | DELAYED_RELEASE_TABLET | Freq: Every day | ORAL | 0 refills | Status: AC

## 2020-12-10 MED ORDER — BUPROPION HCL ER (XL) 300 MG PO TB24: 300.0000 mg | ORAL_TABLET | Freq: Every day | ORAL | 0 refills | Status: AC

## 2020-12-10 NOTE — Progress Notes (Signed)
Discharge Note:   Pt discharged at 13:55; left with safe transport as her ride set up by SW. Pt upon discharge was A&Ox4, is calm, cooperative, pleasant, denies suicidal and homicidal ideation, denies auditory and visual hallucinations. Pt was given discharge instructions, which included pt's follow up appointments, discharge medication prescriptions, and medication education; pt verbalized understanding of all. Pt denies SI/HI/AVH, is A&Ox4, voiced no complaints, no distress noted, none reported. All personal belongings returned to pt upon discharge.

## 2020-12-10 NOTE — BHH Counselor (Signed)
CSW attempted to contact [name] but there was no answer.  CSW was unable to leave a voicemail due to the mailbox not being set up.  CSW discussed the discharge plans with the Pt who states that she will go to the [facility] and work on getting into a shelter from there.  CSW provided the Pt with a list of shelters and other housing resources.

## 2020-12-10 NOTE — Progress Notes (Signed)
Angela Moran was up and visible on the unit.  She denied SI/HI or AVH.  She did report anxiety and hydroxyzine given at bedtime with good relief.  She talked about going to [facility] tomorrow and is glad she will be discharged.  She denied any pain or discomfort and appeared to be in no physical distress.  She is currently resting with her eyes closed and appears to be asleep.  Q 15 minute checks maintained for safety.   12/09/20 2129  Psych Admission Type (Psych Patients Only)  Admission Status Voluntary  Psychosocial Assessment  Patient Complaints Anxiety  Eye Contact Fair  Facial Expression Animated  Affect Appropriate to circumstance  Speech Soft  Interaction Assertive  Motor Activity Other (Comment) (wdl)  Appearance/Hygiene Unremarkable  Behavior Characteristics Cooperative  Mood Anxious  Thought Process  Coherency WDL  Content WDL  Delusions None reported or observed  Perception WDL  Hallucination None reported or observed  Judgment WDL  Confusion None  Danger to Self  Current suicidal ideation? Denies  Danger to Others  Danger to Others None reported or observed

## 2020-12-10 NOTE — BHH Counselor (Signed)
CSW contacted [facility] and was informed that there are no beds available today and that the Pt will need to continue to call each day to check on an available bed.

## 2020-12-10 NOTE — Discharge Summary (Addendum)
Physician Discharge Summary Note  Patient:  Angela Moran is an 30 y.o., female MRN:  [id number] DOB:  11/07/1990 Patient phone:  [phone] (home)  [place]   [place] [id number],  Total Time Spent in Direct Patient Care on Day of Discharge: I personally spent 30 minutes on the unit in direct patient care. The direct patient care time included face-to-face time with the patient, reviewing the patient's chart, communicating with other professionals, and coordinating care. Greater than 50% of this time was spent in counseling or coordinating care with the patient regarding goals of hospitalization, psycho-education, and discharge planning needs.   Date of Admission:  12/03/2020 Date of Discharge: 12/10/2020  Reason for Admission:   Per H and P: Virgina EvenerCallie Moran is a 30 year old female with a PPHx of MDD, anxiety, opioid use disorder, alcohol use disorder, methamphetamine use disorder presenting voluntarily for depression with SI with a plan to overdose.    Per chart review, patient was recently made homeless, as she was no longer allowed to live with her sister in law [name](Rachal Whiting [phone]). The patient states that "she is tired of taking care of me". She reports staying with a female acquaintance since that time, but says that she is unable to return there upon discharge.      On interview this afternoon, patient is irritable and has poor eye contact. She reports having a headache, nausea, and myalgias. She feels like she is withdrawing from opioids. She denies any history of suicide attempts, and endorses past history of suicidal ideation that "never really goes away". Patient reports history of self-injurious behavior via cutting, but patient states that she has not cut herself intentionally since the age of [age] years old. She endorses feelings of not wanting to get out of bed in the past 2 weeks. She endorses anhedonia and feelings of guilt, hopelessness, and  worthlessness over the past few months. She reports a previous period of sobriety for 1 month after going to [facility].5 years ago. She says that she was still depressed during this period of abstinence. She denies elevated mood, recent reckless spending or gambling, and hypersexuality. She reports a period of 3 days several weeks ago that she stayed awake, but attributes this period of time to using methamphetamine. She states that she has been using fentanyl more than any other drugs during the past several weeks. She states that she will "only take Wellbutrin", as she says that this has worked for her in the past. She states that she does not want to go to rehab but is interested in getting a car.    She has had multiple prior hospitalizations for suicidal ideation, with the most recent being 05/21/2020. She was hospitalized from 1/23 to 05/21/2020 for MDD with suicidal ideation. Her discharge medications included bupropion, hydroxyzine, and trazodone. She stated that after she was discharged from the hospital at that time she did not follow-up with anyone. She used the medications until they ran out. She stated she did not remain sober after discharge.  Principal Problem: MDD (major depressive disorder), recurrent severe, without psychosis (HCC) Discharge Diagnoses: Principal Problem:   MDD (major depressive disorder), recurrent severe, without psychosis (HCC) Active Problems:   Stimulant use disorder   Opiate abuse, continuous (HCC)   Past Psychiatric History: as above  Past Medical History:  Past Medical History:  Diagnosis Date   Depression    doing ok   HSV-2 infection    Infection  UTI   Pregnant    Seizures (HCC) 2014   related to drug use    Vaginal Pap smear, abnormal    clear on repeat    Past Surgical History:  Procedure Laterality Date   CESAREAN SECTION N/A 03/07/2018   Procedure: REPEAT CESAREAN SECTION;  Surgeon: Levie Heritage, DO;  Location: [facility]  BIR[facility]ING [facility];  Service: Obstetrics;  Laterality: N/A;   NO PAST SURGERIES     Family History:  Family History  Problem Relation Age of Onset   Other Mother        Bone deteriation   Alcohol abuse Mother    Cirrhosis Father    Alcohol abuse Father    Diabetes Brother    Alcohol abuse Maternal Grandmother    Alcohol abuse Maternal Grandfather    Alcohol abuse Paternal Grandmother    Alcohol abuse Paternal Grandfather    Autism Brother    ADD / ADHD Brother    Family Psychiatric  History: depression in "everybody", no suicide, EtOH use disorder in mother and father Social History:  Social History   Substance and Sexual Activity  Alcohol Use Yes   Comment: 4 beers daily, 2-4 shots daily     Social History   Substance and Sexual Activity  Drug Use Yes   Types: Marijuana, Cocaine, Heroin   Comment: last used last week    Social History   Socioeconomic History   Marital status: Single    Spouse name: Not on file   Number of children: Not on file   Years of education: Not on file   Highest education level: Not on file  Occupational History   Not on file  Tobacco Use   Smoking status: Every Day    Packs/day: 1.50    Years: 8.00    Pack years: 12.00    Types: Cigarettes   Smokeless tobacco: Never  Vaping Use   Vaping Use: Former  Substance and Sexual Activity   Alcohol use: Yes    Comment: 4 beers daily, 2-4 shots daily   Drug use: Yes    Types: Marijuana, Cocaine, Heroin    Comment: last used last week   Sexual activity: Not Currently    Birth control/protection: I.U.D.  Other Topics Concern   Not on file  Social History Narrative   Not on file   Social Determinants of Health   Financial Resource Strain: Not on file  Food Insecurity: Not on file  Transportation Needs: Not on file  Physical Activity: Not on file  Stress: Not on file  Social Connections: Not on file    Hospital Course:   Patient was irritable the first several days of admission and  complained of nausea and myalgias, consistent with opioid withdrawal. Patient refused any antidepressant medication other than Wellbutrin. She was asked about seizure history and reported one after smoking K-2 when she was 30 years old. She was informed about the risk of seizure with Wellbutrin and still was only open to that ADM. She was offered several alternatives. In the days leading up to discharge she denied SI. She was accepted at [facility] but no bed was presently available. She was discharged to the [facility] with plans to continue checking bed availability at [facility] daily. During admission she was noted to have elevated LFTs that were trending down prior to discharge. She was asymptomatic and had normal amylase, lipase, PT/INR. She had a positive Hep C Ab and a Hep C viral load was pending at the  time of discharge. She was advised to see a PCP after discharge for follow-up of LFT elevation and additional w/u of Hep C positive Ab. She additionally had a mildly elevated HbgA1c at 6.0 and was urged to see a PCP after discharge for pre-diabetes monitoring. She was started on protonix for GERD symptoms and sense of food being caught at times in her esophagus with instructions to see PCP for w/u after discharge. She was tried on Melatonin and Trazodone for insomnia but declined a sleep aid at time of discharge.   During the course of his hospitalization, the 15-minute checks were adequate to ensure patient's safety. The patient did not display any dangerous, violent or suicidal behavior on the unit.  The patient interacted with patients & staff appropriately, participated appropriately in the group sessions/therapies. The patient's medications were addressed & adjusted to meet his needs. The patient was recommended for outpatient follow-up care & medication management upon discharge to assure continuity of care & mood stability.  At the time of discharge the patient is not reporting any acute suicidal/homicidal ideations.  The patient feels more confident about their self-care & in managing their mental health. The patient currently denies any new issues or concerns. Education and supportive counseling provided throughout their hospital stay & upon discharge.   Today upon their discharge evaluation with the attending psychiatrist, the patient shares they are doing well and that they feel ready for discharge. The patient denies any other specific concerns. The patient is sleeping well. Their appetite is good. The patient denies other physical complaints. The patient denies AH/VH, delusional thoughts or paranoia. The patient does not appear to be responding to any internal stimuli. The patient feels that their medications have been helpful & is in agreement to continue their current treatment regimen as recommended. The patient was able to engage in safety planning including plan to return to [facility] or contact emergency services if the patient feels unable to maintain their own safety or the safety of others. Pt had no further questions, comments, or concerns. The patient left [facility] with all personal belongings in no apparent distress.   Disposition: to [facility]  Musculoskeletal: Strength & Muscle Tone: NA Gait & Station: normal Patient leans: N/A   Psychiatric Specialty Exam:   Presentation  General Appearance: Fair grooming, casually dressed   Eye Contact: Fair   Speech:Clear and Coherent; Normal Rate Spontaneous   Speech Volume:Normal   Handedness: Right   Mood and Affect  Mood:Dysphoric, mildly irritable     Affect:Congruent     Thought Process  Thought Processes:Coherent; Goal Directed; Linear   Descriptions of Associations:Intact   Orientation:Full (Time, Place and Person)   Thought Content: Denies SI/HI/AVH, delusions, first rank symptoms, and paranoia. Patient is not grossly responding to internal/external stimuli on exam and did not make delusional statements.   History of  Schizophrenia/Schizoaffective disorder:No   Duration of Psychotic Symptoms: none Hallucinations: none Ideas of Reference:None   Suicidal Thoughts: denies  Homicidal Thoughts: denies   Sensorium  Memory:Immediate Fair; Recent Fair; Remote Fair   Judgment: Fair Insight:Fair     Executive Functions  Concentration:Good   Attention Span:Good   Recall:Fair   Fund of Knowledge:Fair   Language:Good     Psychomotor Activity  Psychomotor Activity: Normal   Assets  Assets:Communication Skills; Desire for Improvement; Leisure Time; Resilience     Sleep  Sleep: fair   Physical Exam: Physical Exam Vitals and nursing note reviewed.  HENT:     Head: Normocephalic and  atraumatic.  Pulmonary:     Effort: Pulmonary effort is normal.  Neurological:     General: No focal deficit present.     Mental Status: She is alert and oriented to person, place, and time.    Review of Systems  Respiratory:  Negative for shortness of breath.   Cardiovascular:  Negative for chest pain.  Gastrointestinal:  Negative for nausea and vomiting.  Neurological:  Negative for headaches.  Blood pressure 134/80, pulse 80, temperature 98.1 F (36.7 C), temperature source Oral, resp. rate 18, height 5\' 6"  (1.676 m), weight 74.8 kg, SpO2 99 %. Body mass index is 26.63 kg/m.   Social History   Tobacco Use  Smoking Status Every Day   Packs/day: 1.50   Years: 8.00   Pack years: 12.00   Types: Cigarettes  Smokeless Tobacco Never   Tobacco Cessation:  A prescription for an FDA-approved tobacco cessation medication provided at discharge   Blood Alcohol level:  Lab Results  Component Value Date   ETH <10 12/02/2020   ETH 29 (H) 05/17/2020    Metabolic Disorder Labs:  Lab Results  Component Value Date   HGBA1C 6.0 (H) 12/03/2020   MPG 125.5 12/03/2020   MPG 96.8 05/18/2020   No results found for: PROLACTIN Lab Results  Component Value Date   CHOL 122 12/03/2020   TRIG 110 12/03/2020    HDL 36 (L) 12/03/2020   CHOLHDL 3.4 12/03/2020   VLDL 22 12/03/2020   LDLCALC 64 12/03/2020   LDLCALC 51 05/18/2020    See Psychiatric Specialty Exam and Suicide Risk Assessment completed by Attending Physician prior to discharge.  Discharge destination:  Other:  [facility]  Is patient on multiple antipsychotic therapies at discharge:  No   Has Patient had three or more failed trials of antipsychotic monotherapy by history:  No  Recommended Plan for Multiple Antipsychotic Therapies: NA   Allergies as of 12/10/2020   No Known Allergies      Medication List     STOP taking these medications    doxycycline 100 MG tablet Commonly known as: VIBRA-TABS   hydrOXYzine 25 MG tablet Commonly known as: ATARAX/VISTARIL   ibuprofen 400 MG tablet Commonly known as: ADVIL   mupirocin cream 2 % Commonly known as: BACTROBAN   neomycin-bacitracin-polymyxin Oint Commonly known as: NEOSPORIN   nicotine 21 mg/24hr patch Commonly known as: NICODERM CQ - dosed in mg/24 hours Replaced by: nicotine 14 mg/24hr patch   traZODone 50 MG tablet Commonly known as: DESYREL       TAKE these medications      Indication  buPROPion 300 MG 24 hr tablet Commonly known as: WELLBUTRIN XL Take 1 tablet (300 mg total) by mouth daily. Start taking on: December 11, 2020 What changed:  medication strength how much to take additional instructions  Indication: Major Depressive Disorder   gabapentin 300 MG capsule Commonly known as: NEURONTIN Take 1 capsule (300 mg total) by mouth 3 (three) times daily.  Indication: anxiety   nicotine 14 mg/24hr patch Commonly known as: NICODERM CQ - dosed in mg/24 hours Place 1 patch (14 mg total) onto the skin daily. Start taking on: December 11, 2020 Replaces: nicotine 21 mg/24hr patch  Indication: Nicotine Addiction   pantoprazole 40 MG tablet Commonly known as: PROTONIX Take 1 tablet (40 mg total) by mouth daily. Start taking on: December 11, 2020   Indication: Heartburn        Follow-up Information     [facility].  Go to.   Specialty: Behavioral Health Why: Please go to this provider for therapy and medication management services during walk in hours:  Monday through Wednesday, from 7:45 am to 10:30 am.  Services are provided on a first come, first served basis. Contact information: [address] [place] [address] [phone]        [facility] Follow up.   Specialty: Addiction Medicine Why: Referral made Contact information: [address] [place] [address] [phone]         [facility]. Schedule an appointment as soon as possible for a visit.   Why: Please contact this provider to schedule an appointment for primary care services and any necessary referrals to specialists. Contact information: [address] [place] [address]                Follow-up recommendations:  Activity as tolerated. Diet as recommended by PCP. Keep all scheduled follow-up appointments as recommended.  Patient is instructed to take all prescribed medications as recommended. Report any side effects or adverse reactions to your outpatient psychiatrist. Patient is instructed to abstain from alcohol and illegal drugs while on prescription medications. In the event of worsening symptoms, patient is instructed to call the crisis hotline, 911, or go to the nearest emergency department for evaluation and treatment.  Prescriptions given at discharge. Patient agreeable to plan. Given opportunity to ask questions. Appears to feel comfortable with discharge denies any current suicidal or homicidal thought.  Patient is also instructed prior to discharge to: Take all medications as prescribed by mental healthcare provider. Report any adverse effects and or reactions from the medicines to outpatient provider  promptly. Patient has been instructed & cautioned: To not engage in alcohol and or illegal drug use while on prescription medicines. In the event of worsening symptoms,  patient is instructed to call the crisis hotline, 911 and or go to the nearest ED for appropriate evaluation and treatment of symptoms. To follow-up with primary care provider for other medical issues, concerns and or health care needs  The patient was evaluated each day by a clinical provider to ascertain response to treatment. Improvement was noted by the patient's report of decreasing symptoms, improved sleep and appetite, affect, medication tolerance, behavior, and participation in unit programming.  Patient was asked each day to complete a self inventory noting mood, mental status, pain, new symptoms, anxiety and concerns.  Patient responded well to medication and being in a therapeutic and supportive environment. Positive and appropriate behavior was noted and the patient was motivated for recovery. The patient worked closely with the treatment team and case manager to develop a discharge plan with appropriate goals. Coping skills, problem solving as well as relaxation therapies were also part of the unit programming.  By the day of discharge patient was in much improved condition than upon admission.  Symptoms were reported as significantly decreased or resolved completely. The patient denied SI/HI and voiced no AVH. The patient was motivated to continue taking medication with a goal of continued improvement in mental health.   Patient was discharged home with a plan to follow up as noted below.  Comments:   As above  Signed: Carlyn Reichert PGY-1, Psychiatry

## 2020-12-10 NOTE — Progress Notes (Signed)
Pt did not attend, she was asking about her discharge.
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
## 2020-12-10 NOTE — Progress Notes (Signed)
  [facility] Adult Case Management Discharge Plan :  Will you be returning to the same living situation after discharge:  No. [facility] and Shelter  At discharge, do you have transportation home?: Yes,  Safe Transport  Do you have the ability to pay for your medications: Yes,  Community   Release of information consent forms completed and in the chart;  Patient's signature needed at discharge.  Patient to Follow up at:  Follow-up Information     [facility]. Go to.   Specialty: Behavioral Health Why: Please go to this provider for therapy and medication management services during walk in hours:  Monday through Wednesday, from 7:45 am to 10:30 am.  Services are provided on a first come, first served basis. Contact information: [address] [place] [address] [phone]        [facility] Follow up.   Specialty: Addiction Medicine Why: Referral made Contact information: [address] [place] [address] [phone]         [facility]. Schedule an appointment as soon as possible for a visit.   Why: Please contact this provider to schedule an appointment for primary care services and any necessary referrals to specialists. Contact information: [address], [address] [place] [address]                Next level of care provider has access to [facility] and Suicide Prevention discussed: Yes,  with sister-in-law and patient      Has patient been referred to the Quitline?: Patient refused referral  Patient has been referred for addiction treatment: Yes  Aram Beecham, LCSWA 12/10/2020, 10:02 AM

## 2020-12-10 NOTE — Progress Notes (Signed)
Pt. Didn't attend group. 

## 2020-12-11 LAB — HCV RNA QUANT
HCV Quantitative Log: 5.842 log10 IU/mL (ref >1.70)
HCV Quantitative: 695000 IU/mL (ref >50)

## 2020-12-11 NOTE — Plan of Care (Signed)
Called patient and informed her of positive Hep C RNA. Discussed significance of test and gave patient the provider information to contact (previously given in AVS). All questions answered.   Carlyn Reichert PGY-1, Psychiatry

## 2020-12-28 NOTE — Plan of Care (Signed)
Patient's positive Hep C quant viral load lab results were sent to [facility]. As part of confidential communicable disease report on 12/28/20. Patient was made aware of positive result on 12/10/20 by Dr. Jerrel Ivory.  Angela Crews, MD, Celene Skeen

## 2021-02-04 ENCOUNTER — Encounter (HOSPITAL_BASED_OUTPATIENT_CLINIC_OR_DEPARTMENT_OTHER): Payer: Self-pay | Admitting: Obstetrics and Gynecology

## 2021-02-04 ENCOUNTER — Other Ambulatory Visit: Payer: Self-pay

## 2021-02-04 DIAGNOSIS — S199XXA Unspecified injury of neck, initial encounter: Secondary | ICD-10-CM | POA: Insufficient documentation

## 2021-02-04 DIAGNOSIS — R200 Anesthesia of skin: Secondary | ICD-10-CM | POA: Insufficient documentation

## 2021-02-04 DIAGNOSIS — R7401 Elevation of levels of liver transaminase levels: Secondary | ICD-10-CM | POA: Insufficient documentation

## 2021-02-04 DIAGNOSIS — W0110XA Fall on same level from slipping, tripping and stumbling with subsequent striking against unspecified object, initial encounter: Secondary | ICD-10-CM | POA: Insufficient documentation

## 2021-02-04 DIAGNOSIS — S0990XA Unspecified injury of head, initial encounter: Secondary | ICD-10-CM | POA: Insufficient documentation

## 2021-02-04 DIAGNOSIS — F17210 Nicotine dependence, cigarettes, uncomplicated: Secondary | ICD-10-CM | POA: Insufficient documentation

## 2021-02-04 DIAGNOSIS — Z8616 Personal history of COVID-19: Secondary | ICD-10-CM | POA: Insufficient documentation

## 2021-02-04 LAB — COMPREHENSIVE METABOLIC PANEL
ALT: 412 U/L — ABNORMAL HIGH (ref 0–44)
AST: 287 U/L — ABNORMAL HIGH (ref 15–41)
Albumin: 4.1 g/dL (ref 3.5–5.0)
Alkaline Phosphatase: 40 U/L (ref 38–126)
Anion gap: 6 (ref 5–15)
BUN: 10 mg/dL (ref 6–20)
CO2: 29 mmol/L (ref 22–32)
Calcium: 9.8 mg/dL (ref 8.9–10.3)
Chloride: 104 mmol/L (ref 98–111)
Creatinine, Ser: 0.5 mg/dL (ref 0.44–1.00)
GFR, Estimated: >60 mL/min (ref >60)
Glucose, Bld: 99 mg/dL (ref 70–99)
Potassium: 3.9 mmol/L (ref 3.5–5.1)
Sodium: 139 mmol/L (ref 135–145)
Total Bilirubin: 0.4 mg/dL (ref 0.3–1.2)
Total Protein: 6.7 g/dL (ref 6.5–8.1)

## 2021-02-04 LAB — CBC WITH DIFFERENTIAL/PLATELET
Abs Immature Granulocytes: 0.01 10*3/uL (ref 0.00–0.07)
Basophils Absolute: 0 10*3/uL (ref 0.0–0.1)
Basophils Relative: 1 %
Eosinophils Absolute: 0.2 10*3/uL (ref 0.0–0.5)
Eosinophils Relative: 5 %
HCT: 39.4 % (ref 36.0–46.0)
Hemoglobin: 12.6 g/dL (ref 12.0–15.0)
Immature Granulocytes: 0 %
Lymphocytes Relative: 33 %
Lymphs Abs: 1.5 10*3/uL (ref 0.7–4.0)
MCH: 28.5 pg (ref 26.0–34.0)
MCHC: 32 g/dL (ref 30.0–36.0)
MCV: 89.1 fL (ref 80.0–100.0)
Monocytes Absolute: 0.4 10*3/uL (ref 0.1–1.0)
Monocytes Relative: 10 %
Neutro Abs: 2.2 10*3/uL (ref 1.7–7.7)
Neutrophils Relative %: 51 %
Platelets: 231 10*3/uL (ref 150–400)
RBC: 4.42 MIL/uL (ref 3.87–5.11)
RDW: 14.3 % (ref 11.5–15.5)
WBC: 4.4 10*3/uL (ref 4.0–10.5)
nRBC: 0 % (ref 0.0–0.2)

## 2021-02-04 NOTE — ED Triage Notes (Signed)
Patient reports to the ER for numbness and tingling in the fingers x5 days. States the numbness is now constant whereas 5 days ago when it first started it was pain with some numbness and now it is not going away

## 2021-02-05 ENCOUNTER — Emergency Department (HOSPITAL_BASED_OUTPATIENT_CLINIC_OR_DEPARTMENT_OTHER)
Admission: EM | Admit: 2021-02-05 | Discharge: 2021-02-05 | Disposition: A | Discharge status: Home / Self Care | Payer: Medicaid Other | Attending: Emergency Medicine | Admitting: Emergency Medicine

## 2021-02-05 ENCOUNTER — Emergency Department (HOSPITAL_COMMUNITY): Payer: Medicaid Other

## 2021-02-05 ENCOUNTER — Emergency Department (HOSPITAL_BASED_OUTPATIENT_CLINIC_OR_DEPARTMENT_OTHER): Payer: Medicaid Other

## 2021-02-05 DIAGNOSIS — M5412 Radiculopathy, cervical region: Secondary | ICD-10-CM

## 2021-02-05 DIAGNOSIS — M5020 Other cervical disc displacement, unspecified cervical region: Secondary | ICD-10-CM

## 2021-02-05 MED ORDER — GADOBUTROL 1 MMOL/ML IV SOLN
6.9000 mL | Freq: Once | INTRAVENOUS | Status: AC | PRN
  Administered 2021-02-05: 6.9 mL via INTRAVENOUS

## 2021-02-05 MED ORDER — PREDNISONE 10 MG (21) PO TBPK: ORAL_TABLET | Freq: Every day | ORAL | 0 refills | Status: AC

## 2021-02-05 NOTE — ED Provider Notes (Signed)
Pt presenting from [facility] for MRI C spine s/2 bilateral hand numbness x 5-6 days with concern for spinal epidural abscess with hx of IVDA. CT head and CT C spine negative at [facility].  Physical Exam  BP 102/68   Pulse 71   Temp 98.6 F (37 C) (Oral)   Resp 10   Ht 5\' 6"  (1.676 m)   Wt 69.9 kg   SpO2 97%   BMI 24.86 kg/m   Physical Exam Vitals and nursing note reviewed.  Constitutional:      Appearance: She is not ill-appearing.  HENT:     Head: Normocephalic and atraumatic.  Eyes:     Conjunctiva/sclera: Conjunctivae normal.  Cardiovascular:     Rate and Rhythm: Normal rate and regular rhythm.  Pulmonary:     Effort: Pulmonary effort is normal.     Breath sounds: Normal breath sounds.  Skin:    General: Skin is warm and dry.     Coloration: Skin is not jaundiced.  Neurological:     Mental Status: She is alert.     Comments: Moving all extremities without difficulty. Grip strength 5/5 bilaterally. Sensation intact throughout BUEs.     ED Course/Procedures     Procedures  CT Head Wo Contrast  Result Date: 02/05/2021 CLINICAL DATA:  Head trauma, mod-severe; Neck trauma, focal neuro deficit or paresthesia (Age 29-64y). Fall several days ago, neck pain and BUE numbness, hx/o IVDA EXAM: CT HEAD WITHOUT CONTRAST CT CERVICAL SPINE WITHOUT CONTRAST TECHNIQUE: Multidetector CT imaging of the head and cervical spine was performed following the standard protocol without intravenous contrast. Multiplanar CT image reconstructions of the cervical spine were also generated. COMPARISON:  None. FINDINGS: CT HEAD FINDINGS Brain: Normal anatomic configuration. No abnormal intra or extra-axial mass lesion or fluid collection. No abnormal mass effect or midline shift. No evidence of acute intracranial hemorrhage or infarct. Ventricular size is normal. Cerebellum unremarkable. Vascular: Unremarkable Skull: Intact Sinuses/Orbits: Paranasal sinuses are clear. Orbits are unremarkable.  Other: Mastoid air cells and middle ear cavities are clear. CT CERVICAL SPINE FINDINGS Alignment: Normal. Skull base and vertebrae: No acute fracture. No primary bone lesion or focal pathologic process. Soft tissues and spinal canal: Choose Disc levels: 1 vertebral body heights and intervertebral disc heights are preserved. The prevertebral soft tissues are not thickened on sagittal reformats. The spinal canal is widely patent. No significant neuroforaminal narrowing is identified. Upper chest: Unremarkable Other: None IMPRESSION: No acute intracranial abnormality.  No calvarial fracture. No acute fracture or listhesis of the cervical spine. Electronically Signed   By: 02/07/2021 M.D.   On: 02/05/2021 01:51   CT Cervical Spine Wo Contrast  Result Date: 02/05/2021 CLINICAL DATA:  Head trauma, mod-severe; Neck trauma, focal neuro deficit or paresthesia (Age 29-64y). Fall several days ago, neck pain and BUE numbness, hx/o IVDA EXAM: CT HEAD WITHOUT CONTRAST CT CERVICAL SPINE WITHOUT CONTRAST TECHNIQUE: Multidetector CT imaging of the head and cervical spine was performed following the standard protocol without intravenous contrast. Multiplanar CT image reconstructions of the cervical spine were also generated. COMPARISON:  None. FINDINGS: CT HEAD FINDINGS Brain: Normal anatomic configuration. No abnormal intra or extra-axial mass lesion or fluid collection. No abnormal mass effect or midline shift. No evidence of acute intracranial hemorrhage or infarct. Ventricular size is normal. Cerebellum unremarkable. Vascular: Unremarkable Skull: Intact Sinuses/Orbits: Paranasal sinuses are clear. Orbits are unremarkable. Other: Mastoid air cells and middle ear cavities are clear. CT CERVICAL SPINE FINDINGS Alignment: Normal. Skull base and  vertebrae: No acute fracture. No primary bone lesion or focal pathologic process. Soft tissues and spinal canal: Choose Disc levels: 1 vertebral body heights and intervertebral disc  heights are preserved. The prevertebral soft tissues are not thickened on sagittal reformats. The spinal canal is widely patent. No significant neuroforaminal narrowing is identified. Upper chest: Unremarkable Other: None IMPRESSION: No acute intracranial abnormality.  No calvarial fracture. No acute fracture or listhesis of the cervical spine. Electronically Signed   By: Helyn Numbers M.D.   On: 02/05/2021 01:51   MR Cervical Spine W or Wo Contrast  Result Date: 02/05/2021 CLINICAL DATA:  Cervical radiculopathy, infection suspected. Bilateral upper extremity numbness. History of IV drug abuse. EXAM: MRI CERVICAL SPINE WITHOUT AND WITH CONTRAST TECHNIQUE: Multiplanar and multiecho pulse sequences of the cervical spine, to include the craniocervical junction and cervicothoracic junction, were obtained without and with intravenous contrast. CONTRAST:  6.82mL GADAVIST GADOBUTROL 1 MMOL/ML IV SOLN COMPARISON:  CT 02/05/2021 FINDINGS: Alignment: Straightening of the cervical lordosis without static listhesis. Vertebrae: No fracture, evidence of discitis, or bone lesion. Cord: Normal cord signal and morphology. No epidural collections. No abnormal postcontrast enhancement. Posterior Fossa, vertebral arteries, paraspinal tissues: Negative. Mucosal thickening noted within the bilateral maxillary sinuses. Disc levels: C2-C3: Unremarkable. C3-C4: Unremarkable. C4-C5: Unremarkable. C5-C6: Shallow right paracentral disc protrusion. Unremarkable facet joints. No foraminal or canal stenosis. C6-C7: Unremarkable. C7-T1: Unremarkable. IMPRESSION: 1. Essentially unremarkable pre and post-contrast MRI of the cervical spine. No evidence of discitis-osteomyelitis or epidural abscess. 2. Shallow right paracentral disc protrusion at C5-C6. No foraminal or canal stenosis at any level. Electronically Signed   By: Duanne Guess D.O.   On: 02/05/2021 10:28    MDM  MRI negative for epidural abscess. Pt with right disc protrusion  C5-C6 which could be contributing to her symptoms today. Will discharge with oral prednisone taper and neurosurgery follow up. Pt in agreement with plan at this time. Stable for discharge.        Tanda Rockers, PA-C 02/05/21 1104    Vanetta Mulders, MD 02/06/21 [phone]

## 2021-02-05 NOTE — ED Notes (Addendum)
Called [facility] to transport patient to [facility]--patient advised she has her own ride--[facility] cancelled

## 2021-02-05 NOTE — Discharge Instructions (Addendum)
Please pick up prescription and take as prescribed  Follow up with [facility] for further eval of your  herniated disc  Return to the ED for any new/worsening symptoms

## 2021-02-05 NOTE — ED Provider Notes (Signed)
[facility] EMERGENCY DEPT Provider Note   CSN: [id number] Arrival date & time: 02/04/21  2207     History Chief Complaint  Patient presents with   Numbness    Angela Moran is a 30 y.o. female.  The history is provided by the patient and medical records.  Angela Moran is a 30 y.o. female who presents to the Emergency Department complaining of numbness.  She presents to the ED for evaluation of numbness to bilateral hands that started five days ago as pins and needles, significant pain that came and went.  Three days ago the pain went away and now she has decreased feeling throughout the digits and sxs are now constant.  All digits are involved.    Has mild neck pain.  Had a headache - now gone.  She "pops" her neck frequently.  No fever, night sweats, chest pain, abdominal pain, sob, rash.  Has increased urination.  Has a hx/o hep C.  She is right hand dominant.  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  Worked there for about a month.  No chance of pregnancy.  No prior similar sxs.    Uses IV heroin/fentanyl, meth.  Last used meth last weekend and was up for four days strait.  She did have a fall and hit her head does not think she passed out.      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   Felon of finger 05/18/2020   Alcohol abuse 05/18/2020   Stimulant use disorder 05/18/2020   Opiate abuse, continuous (HCC) 05/18/2020   COVID-19 05/18/2020   MDD (major depressive disorder), recurrent episode, severe (HCC) 05/17/2020   Alcohol use 04/13/2018   Status post cesarean section 03/07/2018   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) 02/23/2018   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   Smoker 12/18/2017   History of illicit drug use 07/17/2017   Depression with anxiety 12/04/2013    Past Surgical History:  Procedure Laterality Date   CESAREAN SECTION N/A 03/07/2018   Procedure: REPEAT CESAREAN SECTION;  Surgeon: Levie Heritage, DO;  Location: [facility];  Service: Obstetrics;  Laterality: N/A;   NO PAST SURGERIES       OB History     Gravida  1   Para  1   Term  1   Preterm  0   AB  0   Living  1      SAB  0   IAB  0   Ectopic  0   Multiple  0   Live Births  1           Family History  Problem Relation Age of Onset   Other Mother        Bone deteriation   Alcohol abuse Mother    Cirrhosis Father    Alcohol abuse Father    Diabetes Brother    Alcohol abuse Maternal Grandmother    Alcohol abuse Maternal Grandfather    Alcohol abuse Paternal Grandmother    Alcohol abuse  Paternal Grandfather    Autism Brother    ADD / ADHD Brother     Social History   Tobacco Use   Smoking status: Every Day    Packs/day: 1.50    Years: 8.00    Pack years: 12.00    Types: Cigarettes   Smokeless tobacco: Never  Vaping Use   Vaping Use: Every day   Substances: Nicotine, Flavoring  Substance Use Topics   Alcohol use: Yes    Comment: 4 beers daily, 2-4 shots daily   Drug use: Yes    Types: Marijuana, Cocaine, Heroin    Comment: Used 02/01/21    Home Medications Prior to Admission medications   Medication Sig Start Date End Date Taking? Authorizing Provider  buPROPion (WELLBUTRIN XL) 300 MG 24 hr tablet Take 1 tablet (300 mg total) by mouth daily. 12/11/20   Carlyn Reichert, MD  gabapentin (NEURONTIN) 300 MG capsule Take 1 capsule (300 mg total) by mouth 3 (three) times daily. 12/10/20   Carlyn Reichert, MD  nicotine (NICODERM CQ - DOSED IN MG/24 HOURS) 14 mg/24hr patch Place 1 patch (14 mg total) onto the skin daily. 12/11/20   Carlyn Reichert, MD   pantoprazole (PROTONIX) 40 MG tablet Take 1 tablet (40 mg total) by mouth daily. 12/11/20   Carlyn Reichert, MD    Allergies    Patient has no known allergies.  Review of Systems   Review of Systems  All other systems reviewed and are negative.  Physical Exam Updated Vital Signs BP 123/86   Pulse 83   Temp 98.6 F (37 C)   Resp 18   Ht 5\' 6"  (1.676 m)   Wt 69.9 kg   SpO2 100%   BMI 24.86 kg/m   Physical Exam Vitals and nursing note reviewed.  Constitutional:      Appearance: She is well-developed.  HENT:     Head: Normocephalic and atraumatic.  Cardiovascular:     Rate and Rhythm: Normal rate and regular rhythm.     Heart sounds: No murmur heard. Pulmonary:     Effort: Pulmonary effort is normal. No respiratory distress.     Breath sounds: Normal breath sounds.  Abdominal:     Palpations: Abdomen is soft.     Tenderness: There is no abdominal tenderness. There is no guarding or rebound.  Musculoskeletal:        General: No tenderness.  Skin:    General: Skin is warm and dry.  Neurological:     Mental Status: She is alert and oriented to person, place, and time.     Comments: 5/5 strength in all four extremities with sensation to light touch intact in all four extremities.  5/5 grip strength bilaterally  Psychiatric:        Behavior: Behavior normal.    ED Results / Procedures / Treatments   Labs (all labs ordered are listed, but only abnormal results are displayed) Labs Reviewed  COMPREHENSIVE METABOLIC PANEL - Abnormal; Notable for the following components:      Result Value   AST 287 (*)    ALT 412 (*)    All other components within normal limits  CBC WITH DIFFERENTIAL/PLATELET    EKG None  Radiology No results found.  Procedures Procedures   Medications Ordered in ED Medications - No data to display  ED Course  I have reviewed the triage vital signs and the nursing notes.  Pertinent labs & imaging results that were available during my  care of the  patient were reviewed by me and considered in my medical decision making (see chart for details).    MDM Rules/Calculators/A&P                          patient with history of IV drug abuse here for evaluation of progressive paresthesias to bilateral hands. She is neurologically intact on evaluation. Given her IV drug abuse she is higher risk for epidural abscess and recommend MRI to further evaluate. Given her recent history of head injury CT head and C-spine were obtained, which were negative for acute abnormality. She does have elevation in her transaminases, which is similar when compared to priors. Plan to transfer to the [facility] emergency department for further imaging evaluation for her paresthesias. Dr. Judd Lien accepts the patient in transfer.  Final Clinical Impression(s) / ED Diagnoses Final diagnoses:  None    Rx / DC Orders ED Discharge Orders     None        Tilden Fossa, MD 02/05/21 2007
